# Patient Record
Sex: Male | Born: 1950 | Race: White | Hispanic: No | Marital: Single | Smoking: Current every day smoker
Health system: Southern US, Community
[De-identification: ages and names within clinical notes are randomized; demographics above are authoritative.]

## PROBLEM LIST (undated history)

## (undated) DIAGNOSIS — E785 Hyperlipidemia, unspecified: Secondary | ICD-10-CM

## (undated) DIAGNOSIS — F319 Bipolar disorder, unspecified: Secondary | ICD-10-CM

## (undated) DIAGNOSIS — I10 Essential (primary) hypertension: Secondary | ICD-10-CM

## (undated) DIAGNOSIS — E119 Type 2 diabetes mellitus without complications: Secondary | ICD-10-CM

## (undated) DIAGNOSIS — C6290 Malignant neoplasm of unspecified testis, unspecified whether descended or undescended: Secondary | ICD-10-CM

## (undated) DIAGNOSIS — K219 Gastro-esophageal reflux disease without esophagitis: Secondary | ICD-10-CM

## (undated) DIAGNOSIS — I219 Acute myocardial infarction, unspecified: Secondary | ICD-10-CM

## (undated) HISTORY — PX: BACK SURGERY: SHX140

## (undated) HISTORY — PX: SURGERY SCROTAL / TESTICULAR: SUR1316

---

## 2005-11-28 ENCOUNTER — Emergency Department: Payer: Self-pay | Admitting: Internal Medicine

## 2010-08-24 ENCOUNTER — Ambulatory Visit: Payer: Self-pay | Admitting: Family Medicine

## 2011-03-16 DIAGNOSIS — E785 Hyperlipidemia, unspecified: Secondary | ICD-10-CM | POA: Insufficient documentation

## 2011-03-16 DIAGNOSIS — E039 Hypothyroidism, unspecified: Secondary | ICD-10-CM | POA: Insufficient documentation

## 2011-03-16 DIAGNOSIS — F319 Bipolar disorder, unspecified: Secondary | ICD-10-CM | POA: Insufficient documentation

## 2011-03-16 DIAGNOSIS — L719 Rosacea, unspecified: Secondary | ICD-10-CM | POA: Insufficient documentation

## 2011-03-16 DIAGNOSIS — K219 Gastro-esophageal reflux disease without esophagitis: Secondary | ICD-10-CM | POA: Insufficient documentation

## 2011-03-16 DIAGNOSIS — N529 Male erectile dysfunction, unspecified: Secondary | ICD-10-CM | POA: Insufficient documentation

## 2011-03-16 DIAGNOSIS — Z8547 Personal history of malignant neoplasm of testis: Secondary | ICD-10-CM | POA: Insufficient documentation

## 2012-12-25 DIAGNOSIS — Z720 Tobacco use: Secondary | ICD-10-CM | POA: Insufficient documentation

## 2012-12-25 DIAGNOSIS — Z6828 Body mass index (BMI) 28.0-28.9, adult: Secondary | ICD-10-CM | POA: Insufficient documentation

## 2014-05-31 DIAGNOSIS — M1711 Unilateral primary osteoarthritis, right knee: Secondary | ICD-10-CM | POA: Insufficient documentation

## 2014-12-16 DIAGNOSIS — D126 Benign neoplasm of colon, unspecified: Secondary | ICD-10-CM | POA: Insufficient documentation

## 2015-10-28 ENCOUNTER — Observation Stay
Admit: 2015-10-28 | Discharge: 2015-10-28 | Disposition: A | Discharge status: Home / Self Care | Payer: BLUE CROSS/BLUE SHIELD | Attending: Internal Medicine | Admitting: Internal Medicine

## 2015-10-28 ENCOUNTER — Observation Stay
Admission: EM | Admit: 2015-10-28 | Discharge: 2015-10-29 | Disposition: A | Discharge status: Home / Self Care | Payer: BLUE CROSS/BLUE SHIELD | Attending: Internal Medicine | Admitting: Internal Medicine

## 2015-10-28 ENCOUNTER — Other Ambulatory Visit: Payer: Self-pay

## 2015-10-28 ENCOUNTER — Observation Stay: Payer: BLUE CROSS/BLUE SHIELD

## 2015-10-28 ENCOUNTER — Encounter: Payer: Self-pay | Admitting: Emergency Medicine

## 2015-10-28 ENCOUNTER — Emergency Department: Payer: BLUE CROSS/BLUE SHIELD

## 2015-10-28 DIAGNOSIS — F319 Bipolar disorder, unspecified: Secondary | ICD-10-CM | POA: Diagnosis not present

## 2015-10-28 DIAGNOSIS — I700 Atherosclerosis of aorta: Secondary | ICD-10-CM | POA: Insufficient documentation

## 2015-10-28 DIAGNOSIS — Z8249 Family history of ischemic heart disease and other diseases of the circulatory system: Secondary | ICD-10-CM | POA: Insufficient documentation

## 2015-10-28 DIAGNOSIS — E1165 Type 2 diabetes mellitus with hyperglycemia: Secondary | ICD-10-CM | POA: Diagnosis not present

## 2015-10-28 DIAGNOSIS — I2510 Atherosclerotic heart disease of native coronary artery without angina pectoris: Secondary | ICD-10-CM | POA: Diagnosis not present

## 2015-10-28 DIAGNOSIS — R0789 Other chest pain: Principal | ICD-10-CM | POA: Insufficient documentation

## 2015-10-28 DIAGNOSIS — Z8547 Personal history of malignant neoplasm of testis: Secondary | ICD-10-CM | POA: Insufficient documentation

## 2015-10-28 DIAGNOSIS — R079 Chest pain, unspecified: Secondary | ICD-10-CM | POA: Diagnosis present

## 2015-10-28 DIAGNOSIS — Z9889 Other specified postprocedural states: Secondary | ICD-10-CM | POA: Insufficient documentation

## 2015-10-28 DIAGNOSIS — Z79899 Other long term (current) drug therapy: Secondary | ICD-10-CM | POA: Insufficient documentation

## 2015-10-28 DIAGNOSIS — I10 Essential (primary) hypertension: Secondary | ICD-10-CM | POA: Insufficient documentation

## 2015-10-28 DIAGNOSIS — F17210 Nicotine dependence, cigarettes, uncomplicated: Secondary | ICD-10-CM | POA: Insufficient documentation

## 2015-10-28 DIAGNOSIS — Z91048 Other nonmedicinal substance allergy status: Secondary | ICD-10-CM | POA: Insufficient documentation

## 2015-10-28 DIAGNOSIS — E785 Hyperlipidemia, unspecified: Secondary | ICD-10-CM | POA: Insufficient documentation

## 2015-10-28 DIAGNOSIS — Z7984 Long term (current) use of oral hypoglycemic drugs: Secondary | ICD-10-CM | POA: Insufficient documentation

## 2015-10-28 DIAGNOSIS — Z7982 Long term (current) use of aspirin: Secondary | ICD-10-CM | POA: Diagnosis not present

## 2015-10-28 DIAGNOSIS — E039 Hypothyroidism, unspecified: Secondary | ICD-10-CM | POA: Insufficient documentation

## 2015-10-28 DIAGNOSIS — I252 Old myocardial infarction: Secondary | ICD-10-CM | POA: Diagnosis not present

## 2015-10-28 DIAGNOSIS — E871 Hypo-osmolality and hyponatremia: Secondary | ICD-10-CM | POA: Insufficient documentation

## 2015-10-28 HISTORY — DX: Type 2 diabetes mellitus without complications: E11.9

## 2015-10-28 HISTORY — DX: Essential (primary) hypertension: I10

## 2015-10-28 HISTORY — DX: Hyperlipidemia, unspecified: E78.5

## 2015-10-28 HISTORY — DX: Bipolar disorder, unspecified: F31.9

## 2015-10-28 HISTORY — DX: Malignant neoplasm of unspecified testis, unspecified whether descended or undescended: C62.90

## 2015-10-28 LAB — CBC
HCT: 39.6 % — ABNORMAL LOW (ref 40.0–52.0)
HEMOGLOBIN: 14.1 g/dL (ref 13.0–18.0)
MCH: 34.1 pg — AB (ref 26.0–34.0)
MCHC: 35.7 g/dL (ref 32.0–36.0)
MCV: 95.6 fL (ref 80.0–100.0)
PLATELETS: 192 10*3/uL (ref 150–440)
RBC: 4.15 MIL/uL — AB (ref 4.40–5.90)
RDW: 16.1 % — ABNORMAL HIGH (ref 11.5–14.5)
WBC: 8.6 10*3/uL (ref 3.8–10.6)

## 2015-10-28 LAB — ECHOCARDIOGRAM COMPLETE
CHL CUP MV DEC (S): 194
E decel time: 194 msec
EERAT: 9.16
FS: 30 % (ref 28–44)
HEIGHTINCHES: 77 in
IV/PV OW: 1.05
LA diam end sys: 44 mm
LA diam index: 1.74 cm/m2
LA vol A4C: 79.8 ml
LA vol: 78.4 mL
LASIZE: 44 mm
LAVOLIN: 31 mL/m2
LV E/e' medial: 9.16
LV E/e'average: 9.16
LV SIMPSON'S DISK: 53
LV TDI E'LATERAL: 10.3
LV TDI E'MEDIAL: 6.64
LV dias vol index: 58 mL/m2
LV dias vol: 146 mL (ref 62–150)
LV e' LATERAL: 10.3 cm/s
LV sys vol index: 27 mL/m2
LV sys vol: 69 mL — AB (ref 21–61)
LVOT area: 5.31 cm2
LVOTD: 26 mm
MV Peak grad: 4 mmHg
MVPKAVEL: 71.2 m/s
MVPKEVEL: 94.3 m/s
PW: 13.5 mm — AB (ref 0.6–1.1)
Stroke v: 77 ml
TAPSE: 28.1 mm
WEIGHTICAEL: 4288 [oz_av]

## 2015-10-28 LAB — BASIC METABOLIC PANEL
Anion gap: 11 (ref 5–15)
Anion gap: 9 (ref 5–15)
BUN: 12 mg/dL (ref 6–20)
BUN: 12 mg/dL (ref 6–20)
CALCIUM: 9 mg/dL (ref 8.9–10.3)
CALCIUM: 8.5 mg/dL — AB (ref 8.9–10.3)
CHLORIDE: 95 mmol/L — AB (ref 101–111)
CHLORIDE: 97 mmol/L — AB (ref 101–111)
CO2: 24 mmol/L (ref 22–32)
CO2: 24 mmol/L (ref 22–32)
CREATININE: 1.09 mg/dL (ref 0.61–1.24)
CREATININE: 0.99 mg/dL (ref 0.61–1.24)
GFR calc non Af Amer: >60 mL/min (ref >60)
GFR calc non Af Amer: >60 mL/min (ref >60)
GLUCOSE: 411 mg/dL — AB (ref 65–99)
Glucose, Bld: 401 mg/dL — ABNORMAL HIGH (ref 65–99)
Potassium: 3.8 mmol/L (ref 3.5–5.1)
Potassium: 3.8 mmol/L (ref 3.5–5.1)
SODIUM: 130 mmol/L — AB (ref 135–145)
Sodium: 130 mmol/L — ABNORMAL LOW (ref 135–145)

## 2015-10-28 LAB — NM MYOCAR MULTI W/SPECT W/WALL MOTION / EF
CHL CUP NUCLEAR SRS: 7
CHL CUP NUCLEAR SSS: 7
CHL CUP RESTING HR STRESS: 59 {beats}/min
CSEPED: 1 min
CSEPEDS: 29 s
Estimated workload: 1 METS
LV dias vol: 152 mL (ref 62–150)
LV sys vol: 83 mL
NUC STRESS TID: 0.94
Peak HR: 83 {beats}/min
SDS: 0

## 2015-10-28 LAB — LIPID PANEL
CHOL/HDL RATIO: 4 ratio
Cholesterol: 165 mg/dL (ref 0–200)
HDL: 41 mg/dL (ref >40)
LDL CALC: UNDETERMINED mg/dL (ref 0–99)
Triglycerides: 401 mg/dL — ABNORMAL HIGH (ref <150)
VLDL: UNDETERMINED mg/dL (ref 0–40)

## 2015-10-28 LAB — GLUCOSE, CAPILLARY
GLUCOSE-CAPILLARY: 378 mg/dL — AB (ref 65–99)
GLUCOSE-CAPILLARY: 101 mg/dL — AB (ref 65–99)
GLUCOSE-CAPILLARY: 260 mg/dL — AB (ref 65–99)
GLUCOSE-CAPILLARY: 85 mg/dL (ref 65–99)
Glucose-Capillary: 379 mg/dL — ABNORMAL HIGH (ref 65–99)
Glucose-Capillary: 418 mg/dL — ABNORMAL HIGH (ref 65–99)

## 2015-10-28 LAB — TROPONIN I

## 2015-10-28 LAB — ETHANOL: Alcohol, Ethyl (B): 82 mg/dL — ABNORMAL HIGH (ref <5)

## 2015-10-28 MED ORDER — SODIUM CHLORIDE 0.9% FLUSH
3.0000 mL | Freq: Two times a day (BID) | INTRAVENOUS | Status: DC
  Administered 2015-10-28 – 2015-10-29 (×3): 3 mL via INTRAVENOUS

## 2015-10-28 MED ORDER — ASPIRIN EC 81 MG PO TBEC
81.0000 mg | DELAYED_RELEASE_TABLET | Freq: Every day | ORAL | Status: DC
  Administered 2015-10-28 – 2015-10-29 (×2): 81 mg via ORAL
  Filled 2015-10-28 (×2): qty 1

## 2015-10-28 MED ORDER — ASPIRIN 300 MG RE SUPP: 300.0000 mg | RECTAL | Status: AC

## 2015-10-28 MED ORDER — NITROGLYCERIN 2 % TD OINT: 0.5000 [in_us] | TOPICAL_OINTMENT | Freq: Four times a day (QID) | TRANSDERMAL | Status: DC

## 2015-10-28 MED ORDER — LAMOTRIGINE 100 MG PO TABS
200.0000 mg | ORAL_TABLET | Freq: Every day | ORAL | Status: DC
  Administered 2015-10-28 – 2015-10-29 (×2): 200 mg via ORAL
  Filled 2015-10-28 (×2): qty 2

## 2015-10-28 MED ORDER — CARVEDILOL 3.125 MG PO TABS
3.1250 mg | ORAL_TABLET | Freq: Two times a day (BID) | ORAL | Status: DC
Start: 2015-10-28 — End: 2015-10-29
  Administered 2015-10-28 – 2015-10-29 (×3): 3.125 mg via ORAL
  Filled 2015-10-28 (×3): qty 1

## 2015-10-28 MED ORDER — INSULIN ASPART 100 UNIT/ML ~~LOC~~ SOLN
0.0000 [IU] | Freq: Three times a day (TID) | SUBCUTANEOUS | Status: DC
  Administered 2015-10-28: 8 [IU] via SUBCUTANEOUS
  Administered 2015-10-29: 3 [IU] via SUBCUTANEOUS
  Filled 2015-10-28: qty 3
  Filled 2015-10-28: qty 8

## 2015-10-28 MED ORDER — ASPIRIN 81 MG PO CHEW: 324.0000 mg | CHEWABLE_TABLET | ORAL | Status: AC

## 2015-10-28 MED ORDER — TECHNETIUM TC 99M TETROFOSMIN IV KIT
10.0000 | PACK | Freq: Once | INTRAVENOUS | Status: AC | PRN
  Administered 2015-10-28: 12.64 via INTRAVENOUS

## 2015-10-28 MED ORDER — ATORVASTATIN CALCIUM 20 MG PO TABS
40.0000 mg | ORAL_TABLET | Freq: Every day | ORAL | Status: DC
  Administered 2015-10-28: 40 mg via ORAL
  Filled 2015-10-28: qty 2

## 2015-10-28 MED ORDER — ONDANSETRON HCL 4 MG/2ML IJ SOLN: 4.0000 mg | Freq: Four times a day (QID) | INTRAMUSCULAR | Status: DC | PRN

## 2015-10-28 MED ORDER — METFORMIN HCL 500 MG PO TABS
1000.0000 mg | ORAL_TABLET | Freq: Every day | ORAL | Status: DC
  Administered 2015-10-28 – 2015-10-29 (×2): 1000 mg via ORAL
  Filled 2015-10-28 (×2): qty 2

## 2015-10-28 MED ORDER — SODIUM CHLORIDE 0.9 % IV SOLN: 250.0000 mL | INTRAVENOUS | Status: DC | PRN

## 2015-10-28 MED ORDER — GLIPIZIDE ER 10 MG PO TB24
10.0000 mg | ORAL_TABLET | Freq: Every day | ORAL | Status: DC
  Administered 2015-10-28 – 2015-10-29 (×2): 10 mg via ORAL
  Filled 2015-10-28 (×2): qty 1

## 2015-10-28 MED ORDER — PIOGLITAZONE HCL 15 MG PO TABS
30.0000 mg | ORAL_TABLET | Freq: Every day | ORAL | Status: DC
  Administered 2015-10-28 – 2015-10-29 (×2): 30 mg via ORAL
  Filled 2015-10-28 (×2): qty 2

## 2015-10-28 MED ORDER — SIMVASTATIN 40 MG PO TABS: 40.0000 mg | ORAL_TABLET | Freq: Every day | ORAL | Status: DC

## 2015-10-28 MED ORDER — NICOTINE 21 MG/24HR TD PT24
21.0000 mg | MEDICATED_PATCH | Freq: Every day | TRANSDERMAL | Status: DC
  Administered 2015-10-28 – 2015-10-29 (×2): 21 mg via TRANSDERMAL
  Filled 2015-10-28 (×2): qty 1

## 2015-10-28 MED ORDER — INSULIN ASPART 100 UNIT/ML ~~LOC~~ SOLN
20.0000 [IU] | Freq: Once | SUBCUTANEOUS | Status: AC
  Administered 2015-10-28: 20 [IU] via SUBCUTANEOUS
  Filled 2015-10-28: qty 20

## 2015-10-28 MED ORDER — LISINOPRIL 5 MG PO TABS
5.0000 mg | ORAL_TABLET | Freq: Every day | ORAL | Status: DC
  Administered 2015-10-29: 5 mg via ORAL
  Filled 2015-10-28: qty 1

## 2015-10-28 MED ORDER — LORAZEPAM 2 MG PO TABS
2.0000 mg | ORAL_TABLET | ORAL | Status: DC | PRN
  Administered 2015-10-28: 2 mg via ORAL
  Filled 2015-10-28: qty 1

## 2015-10-28 MED ORDER — SODIUM CHLORIDE 0.9 % IV BOLUS (SEPSIS)
1000.0000 mL | Freq: Once | INTRAVENOUS | Status: AC
  Administered 2015-10-28: 1000 mL via INTRAVENOUS

## 2015-10-28 MED ORDER — KETOROLAC TROMETHAMINE 15 MG/ML IJ SOLN
30.0000 mg | Freq: Three times a day (TID) | INTRAMUSCULAR | Status: DC
  Administered 2015-10-28 – 2015-10-29 (×3): 30 mg via INTRAVENOUS
  Filled 2015-10-28 (×3): qty 2

## 2015-10-28 MED ORDER — INSULIN ASPART 100 UNIT/ML ~~LOC~~ SOLN: 0.0000 [IU] | Freq: Every day | SUBCUTANEOUS | Status: DC

## 2015-10-28 MED ORDER — SODIUM CHLORIDE 0.9% FLUSH: 3.0000 mL | INTRAVENOUS | Status: DC | PRN

## 2015-10-28 MED ORDER — ENOXAPARIN SODIUM 40 MG/0.4ML ~~LOC~~ SOLN
40.0000 mg | SUBCUTANEOUS | Status: DC
  Administered 2015-10-28 – 2015-10-29 (×2): 40 mg via SUBCUTANEOUS
  Filled 2015-10-28 (×2): qty 0.4

## 2015-10-28 MED ORDER — TECHNETIUM TC 99M TETROFOSMIN IV KIT
30.0000 | PACK | Freq: Once | INTRAVENOUS | Status: AC | PRN
  Administered 2015-10-28: 30.28 via INTRAVENOUS

## 2015-10-28 MED ORDER — NITROGLYCERIN 0.4 MG SL SUBL
0.4000 mg | SUBLINGUAL_TABLET | SUBLINGUAL | Status: DC | PRN
Start: 2015-10-28 — End: 2015-10-29

## 2015-10-28 MED ORDER — LEVOTHYROXINE SODIUM 100 MCG PO TABS
100.0000 ug | ORAL_TABLET | Freq: Every day | ORAL | Status: DC
  Administered 2015-10-28 – 2015-10-29 (×2): 100 ug via ORAL
  Filled 2015-10-28 (×2): qty 1

## 2015-10-28 MED ORDER — REGADENOSON 0.4 MG/5ML IV SOLN
0.4000 mg | Freq: Once | INTRAVENOUS | Status: AC
  Administered 2015-10-28: 0.4 mg via INTRAVENOUS
  Filled 2015-10-28: qty 5

## 2015-10-28 MED ORDER — LISINOPRIL 20 MG PO TABS
20.0000 mg | ORAL_TABLET | Freq: Every day | ORAL | Status: DC
  Filled 2015-10-28: qty 1

## 2015-10-28 MED ORDER — SERTRALINE HCL 50 MG PO TABS
50.0000 mg | ORAL_TABLET | Freq: Every day | ORAL | Status: DC
  Administered 2015-10-28 – 2015-10-29 (×2): 50 mg via ORAL
  Filled 2015-10-28 (×2): qty 1

## 2015-10-28 MED ORDER — INSULIN GLARGINE 100 UNIT/ML ~~LOC~~ SOLN
10.0000 [IU] | Freq: Two times a day (BID) | SUBCUTANEOUS | Status: DC
  Administered 2015-10-28 – 2015-10-29 (×2): 10 [IU] via SUBCUTANEOUS
  Filled 2015-10-28 (×4): qty 0.1

## 2015-10-28 MED ORDER — ACETAMINOPHEN 325 MG PO TABS
650.0000 mg | ORAL_TABLET | ORAL | Status: DC | PRN
Start: 2015-10-28 — End: 2015-10-29

## 2015-10-28 MED ORDER — ASPIRIN EC 81 MG PO TBEC: 81.0000 mg | DELAYED_RELEASE_TABLET | Freq: Every day | ORAL | Status: DC

## 2015-10-28 MED ORDER — IOPAMIDOL (ISOVUE-370) INJECTION 76%
100.0000 mL | Freq: Once | INTRAVENOUS | Status: AC | PRN
  Administered 2015-10-28: 100 mL via INTRAVENOUS

## 2015-10-28 NOTE — Progress Notes (Signed)
Pt CBG resulted at 85. Orders for lantus 10 units SQ. RN not comfortable giving this pt lanuts as he does not take it at home, and his sugars today have already dropped significantly from the 400s this am. MD paged to make aware, no response received. Rachael Fee, RN

## 2015-10-28 NOTE — H&P (Signed)
[facility] [place] NAME: Alexander Tran    MR#:  [id number]  DATE OF BIRTH:  Sep 05, 1950  DATE OF ADMISSION:  10/28/2015  PRIMARY CARE PHYSICIAN: No primary care provider on file.   REQUESTING/REFERRING PHYSICIAN:   CHIEF COMPLAINT:   Chief Complaint  Patient presents with  . Chest Pain    HISTORY OF PRESENT ILLNESS: Alexander Tran  is a 65 y.o. male with a known history of Diabetes mellitus type 2, testicular cancer, bipolar disorder with manic depression, hyperlipidemia presented to the emergency room with chest pain. Patient's chest pain started on Monday but it has been worse since yesterday. Chest pain is located left side of the chest and sharp in nature. Pain is 10 out of 10 on a scale of 1-10. No history of any nausea or vomiting. The chest pain gets worsened on exertion. Patient had last cardiac stress test more than 10 years ago. No history of any shortness of breath, no history of orthopnea. No history of headache dizziness blurry vision. No history of syncope or seizure. First set of troponin is negative. EKG showed ST depression in anterior leads. Hospitalist service was consulted for further care of the patient.  PAST MEDICAL HISTORY:   Past Medical History  Diagnosis Date  . Diabetes mellitus without complication ([place])   . 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[place])   . Manic depression ([place])   . Hyperlipidemia   . Hypertension     PAST SURGICAL HISTORY: Past Surgical History  Procedure Laterality Date  . Back surgery    . Surgery scrotal / testicular      SOCIAL HISTORY:  Social History  Substance Use Topics  . Smoking status: Current Every Day Smoker -- 1.00 packs/day    Types: Cigarettes  . Smokeless tobacco: Not on file  . Alcohol Use: 0.0 oz/week    0 Standard drinks or equivalent per week     Comment: one glass of wine daily    FAMILY HISTORY:  Family History  Problem Relation Age of Onset  . Coronary artery disease  Father     DRUG ALLERGIES:  Allergies  Allergen Reactions  . Other Other (See Comments)    Uncoded Allergy. Allergen: bandaid adhesive, Other Reaction: Contac Dermatitis    REVIEW OF SYSTEMS:   CONSTITUTIONAL: No fever, fatigue or weakness.  EYES: No blurred or double vision.  EARS, NOSE, AND THROAT: No tinnitus or ear pain.  RESPIRATORY: No cough, shortness of breath, wheezing or hemoptysis.  CARDIOVASCULAR: Has chest pain, no orthopnea, edema.  GASTROINTESTINAL: No nausea, vomiting, diarrhea or abdominal pain.  GENITOURINARY: No dysuria, hematuria.  ENDOCRINE: No polyuria, nocturia,  HEMATOLOGY: No anemia, easy bruising or bleeding SKIN: No rash or lesion. MUSCULOSKELETAL: No joint pain or arthritis.   NEUROLOGIC: No tingling, numbness, weakness.  PSYCHIATRY: No anxiety or depression.   MEDICATIONS AT HOME:  Prior to Admission medications   Medication Sig Start Date End Date Taking? Authorizing Provider  aspirin 325 MG tablet Take 325 mg by mouth once.   Yes Historical Provider, MD  aspirin 81 MG tablet Take 81 mg by mouth daily. 10/16/06  Yes Historical Provider, MD  glipiZIDE (GLUCOTROL XL) 10 MG 24 hr tablet Take 10 mg by mouth daily. 09/03/14  Yes Historical Provider, MD  lamoTRIgine (LAMICTAL) 200 MG tablet Take 1 tablet by mouth daily. 01/12/14  Yes Historical Provider, MD  levothyroxine (SYNTHROID, LEVOTHROID) 100 MCG tablet Take 100 mcg by mouth every morning. 12/16/14  Yes Historical Provider, MD  lisinopril (PRINIVIL,ZESTRIL) 20 MG tablet Take 20 mg by mouth daily. 03/24/15  Yes Historical Provider, MD  metFORMIN (GLUCOPHAGE) 1000 MG tablet Take 1,000 mg by mouth daily. 09/03/14  Yes Historical Provider, MD  pioglitazone (ACTOS) 30 MG tablet Take 30 mg by mouth daily. 12/16/14  Yes Historical Provider, MD  sertraline (ZOLOFT) 50 MG tablet Take 50 mg by mouth daily. 01/12/14  Yes Historical Provider, MD  simvastatin (ZOCOR) 40 MG tablet Take 40 mg by mouth at bedtime. 12/16/14   Yes Historical Provider, MD      PHYSICAL EXAMINATION:   VITAL SIGNS: Blood pressure 125/65, pulse 62, temperature 97.8 F (36.6 C), resp. rate 11, height 6\' 5"  (1.956 m), SpO2 96 %.  GENERAL:  65 y.o.-year-old patient lying in the bed with no acute distress.  EYES: Pupils equal, round, reactive to light and accommodation. No scleral icterus. Extraocular muscles intact.  HEENT: Head atraumatic, normocephalic. Oropharynx and nasopharynx clear.  NECK:  Supple, no jugular venous distention. No thyroid enlargement, no tenderness.  LUNGS: Normal breath sounds bilaterally, no wheezing, rales,rhonchi or crepitation. No use of accessory muscles of respiration.  CARDIOVASCULAR: S1, S2 normal. No murmurs, rubs, or gallops.  ABDOMEN: Soft, nontender, nondistended. Bowel sounds present. No organomegaly or mass.  EXTREMITIES: No pedal edema, cyanosis, or clubbing.  NEUROLOGIC: Cranial nerves II through XII are intact. Muscle strength 5/5 in all extremities. Sensation intact. Gait normal. PSYCHIATRIC: The patient is alert and oriented x 3.  SKIN: No obvious rash, lesion, or ulcer.   LABORATORY PANEL:   CBC  Recent Labs Lab 10/28/15 0149  WBC 8.6  HGB 14.1  HCT 39.6*  PLT 192  MCV 95.6  MCH 34.1*  MCHC 35.7  RDW 16.1*   ------------------------------------------------------------------------------------------------------------------  Chemistries   Recent Labs Lab 10/28/15 0149  NA 130*  K 3.8  CL 95*  CO2 24  GLUCOSE 411*  BUN 12  CREATININE 1.09  CALCIUM 9.0   ------------------------------------------------------------------------------------------------------------------ CrCl cannot be calculated (Unknown ideal weight.). ------------------------------------------------------------------------------------------------------------------ No results for input(s): TSH, T4TOTAL, T3FREE, THYROIDAB in the last 72 hours.  Invalid input(s): FREET3   Coagulation profile No  results for input(s): INR, PROTIME in the last 168 hours. ------------------------------------------------------------------------------------------------------------------- No results for input(s): DDIMER in the last 72 hours. -------------------------------------------------------------------------------------------------------------------  Cardiac Enzymes  Recent Labs Lab 10/28/15 0149  TROPONINI <0.03   ------------------------------------------------------------------------------------------------------------------ Invalid input(s): POCBNP  ---------------------------------------------------------------------------------------------------------------  Urinalysis No results found for: COLORURINE, APPEARANCEUR, LABSPEC, PHURINE, GLUCOSEU, HGBUR, BILIRUBINUR, KETONESUR, PROTEINUR, UROBILINOGEN, NITRITE, LEUKOCYTESUR   RADIOLOGY: Dg Chest Portable 1 View  10/28/2015  CLINICAL DATA:  Acute onset of left-sided chest pain with exertion, radiating to the right side of the chest. Initial encounter. EXAM: PORTABLE CHEST 1 VIEW COMPARISON:  None. FINDINGS: The lungs are well-aerated. Pulmonary vascularity is at the upper limits of normal. Mild left basilar opacity likely reflects atelectasis. There is no evidence of pleural effusion or pneumothorax. The cardiomediastinal silhouette is within normal limits. No acute osseous abnormalities are seen. IMPRESSION: Mild left basilar opacity likely reflects atelectasis. Lungs otherwise clear. Electronically Signed   By: Garald Balding M.D.   On: 10/28/2015 01:29    EKG: Orders placed or performed during the hospital encounter of 10/28/15  . ED EKG  . ED EKG  . ED EKG  . ED EKG    IMPRESSION AND PLAN: 65 year old male patient with history of type 2 diabetes mellitus, hypertension, hyperlipidemia, bipolar disorder with manic depression presented to the emergency room with chest pain.  Admitting diagnosis 1. Unstable angina 2.uncontrolled  diabetes mellitus  3.hyponatremia  4.hypertension  5.hyperlipidemia  6.bipolar disorder   treatment plan Admit patient to telemetry observation bed Aspirin 81 mg daily DVT prophylaxis with subcutaneous Lovenox 40 MG daily Cycle troponin to rule out ischemia Check echocardiogram Control blood sugars with Lantus insulin, sliding scale coverage insulin subcutaneously and oral glipizide and pioglitazone Follow-up sodium level Resume statin medication and start patient on oral beta blocker. Cardiology consultation for chest pain Supportive care  All the records are reviewed and case discussed with ED provider. Management plans discussed with the patient, family and they are in agreement.  CODE STATUS:FULL Code Status History    This patient does not have a recorded code status. Please follow your organizational policy for patients in this situation.       TOTAL TIME TAKING CARE OF THIS PATIENT: 51 minutes.    Saundra Shelling M.D on 10/28/2015 at 4:38 AM  Between 7am to 6pm - Pager - [phone]  After 6pm go to [email or web address] - password [email or web address] [facility]  [facility]  Office  [phone]  CC: Primary care physician; No primary care provider on file.

## 2015-10-28 NOTE — ED Notes (Signed)
Admitting MD at bedside.

## 2015-10-28 NOTE — Progress Notes (Signed)
Per Dr. Saralyn Pilar okay to hold lisinopril due to low BP. Will continue to monitor Alexander Tran

## 2015-10-28 NOTE — Progress Notes (Signed)
Echocardiogram 2D Echocardiogram has been performed.  Alexander Tran, Alexander Tran 10/28/2015, 2:26 PM

## 2015-10-28 NOTE — Progress Notes (Signed)
Insulin 20 units novolog given. CBG 378 at 0844. Will continue to monitor. Horton Finer

## 2015-10-28 NOTE — ED Provider Notes (Signed)
[facility] Emergency Department Provider Note  ____________________________________________  Time seen: 1:15 AM  I have reviewed the triage vital signs and the nursing notes.   HISTORY  Chief Complaint Chest Pain     HPI Alexander Tran is a 65 y.o. male with history of diabetes testicular cancer presents with left-sided chest pain 4 days which occurs only with exertion relieved with rest. Patient states that the pain initially was on his left side seen approximately one to 2 minutes resolved with rest. Patient states tonight he had the pain on the right side as well with exertion. Patient has no pain during this evaluation. Patient denies any shortness of breath no diaphoresis no nausea or vomiting. Patient denies any cardiac disease. Patient does however admit that his father had a myocardial infarction in his 58s requiring quadruple bypass.    Past Medical History  Diagnosis Date  . Diabetes mellitus without complication ([place])   . 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[place])   . Manic depression ([place])     There are no active problems to display for this patient.  Past surgical history None No current outpatient prescriptions on file.  Allergies No known drug allergies No family history on file.  Social History Social History  Substance Use Topics  . Smoking status: Current Every Day Smoker -- 1.00 packs/day    Types: Cigarettes  . Smokeless tobacco: None  . Alcohol Use: No    Review of Systems  Constitutional: Negative for fever. Eyes: Negative for visual changes. ENT: Negative for sore throat. Cardiovascular: Negative for chest pain. Respiratory: Negative for shortness of breath. Gastrointestinal: Negative for abdominal pain, vomiting and diarrhea. Genitourinary: Negative for dysuria. Musculoskeletal: Negative for back pain. Skin: Negative for rash. Neurological: Negative for headaches, focal weakness or numbness.  10-point ROS otherwise  negative.  ____________________________________________   PHYSICAL EXAM:  VITAL SIGNS: ED Triage Vitals  Enc Vitals Group     BP 10/28/15 0108 150/73 mmHg     Pulse Rate 10/28/15 0108 77     Resp 10/28/15 0108 20     Temp 10/28/15 0108 97.8 F (36.6 C)     Temp src --      SpO2 10/28/15 0108 97 %     Weight --      Height 10/28/15 0108 6\' 5"  (1.956 m)     Head Cir --      Peak Flow --      Pain Score --      Pain Loc --      Pain Edu? --      Excl. in [place]? --     Constitutional: Alert and oriented. Well appearing and in no distress. Eyes: Conjunctivae are normal. PERRL. Normal extraocular movements. ENT   Head: Normocephalic and atraumatic.   Nose: No congestion/rhinnorhea.   Mouth/Throat: Mucous membranes are moist.   Neck: No stridor. Hematological/Lymphatic/Immunilogical: No cervical lymphadenopathy. Cardiovascular: Normal rate, regular rhythm. Normal and symmetric distal pulses are present in all extremities. No murmurs, rubs, or gallops. Respiratory: Normal respiratory effort without tachypnea nor retractions. Breath sounds are clear and equal bilaterally. No wheezes/rales/rhonchi. Gastrointestinal: Soft and nontender. No distention. There is no CVA tenderness. Genitourinary: deferred Musculoskeletal: Nontender with normal range of motion in all extremities. No joint effusions.  No lower extremity tenderness nor edema. Neurologic:  Normal speech and language. No gross focal neurologic deficits are appreciated. Speech is normal.  Skin:  Skin is warm, dry and intact. No rash noted. Psychiatric: Mood and affect are normal.  Speech and behavior are normal. Patient exhibits appropriate insight and judgment.  ____________________________________________    LABS (pertinent positives/negatives)  Labs Reviewed  CBC - Abnormal; Notable for the following:    RBC 4.15 (*)    HCT 39.6 (*)    MCH 34.1 (*)    RDW 16.1 (*)    All other components within normal  limits  BASIC METABOLIC PANEL  TROPONIN I  ETHANOL    ____________________________________________   EKG  ED ECG REPORT I, Simms N Calandra Madura, the attending physician, personally viewed and interpreted this ECG.   Date: 10/28/2015  EKG Time: 1:06 AM  Rate: 80  Rhythm: Normal sinus rhythm with right bundle branch block.  Axis: Normal  Intervals: Normal  ST&T Change: 1 aVL V5 V6 ST segment depression. ST segment elevation mm   ____________________________________________    RADIOLOGY  DG Chest Portable 1 View (Final result) Result time: 10/28/15 01:29:52   Final result by Rad Results In Interface (10/28/15 01:29:52)   Narrative:   CLINICAL DATA: Acute onset of left-sided chest pain with exertion, radiating to the right side of the chest. Initial encounter.  EXAM: PORTABLE CHEST 1 VIEW  COMPARISON: None.  FINDINGS: The lungs are well-aerated. Pulmonary vascularity is at the upper limits of normal. Mild left basilar opacity likely reflects atelectasis. There is no evidence of pleural effusion or pneumothorax.  The cardiomediastinal silhouette is within normal limits. No acute osseous abnormalities are seen.  IMPRESSION: Mild left basilar opacity likely reflects atelectasis. Lungs otherwise clear.   Electronically Signed By: Garald Balding M.D. On: 10/28/2015 01:29         INITIAL IMPRESSION / ASSESSMENT AND PLAN / ED COURSE  Pertinent labs & imaging results that were available during my care of the patient were reviewed by me and considered in my medical decision making (see chart for details).  Patient took 3 and 24 mg of aspirin before presentation to the emergency department. Given history EKG changes will admit the patient for further evaluation and management. Patient discussed with Dr.Pyreddy or hospital admission.  ____________________________________________   FINAL CLINICAL IMPRESSION(S) / ED DIAGNOSES  Final diagnoses:  Chest  pain, unspecified chest pain type      Gregor Hams, MD 10/28/15 0400

## 2015-10-28 NOTE — Progress Notes (Signed)
Am CBG 418. Notified Dr. Posey Pronto. Per MD give 20 units novolog. Will continue to monitor. Horton Finer

## 2015-10-28 NOTE — Progress Notes (Signed)
Pt requesting a nicotine patch, prime Dr. Has been paged.

## 2015-10-28 NOTE — Progress Notes (Signed)
Inpatient Diabetes Program Recommendations  AACE/ADA: New Consensus Statement on Inpatient Glycemic Control (2015)  Target Ranges:  Prepandial:   less than 140 mg/dL      Peak postprandial:   less than 180 mg/dL (1-2 hours)      Critically ill patients:  140 - 180 mg/dL   Lab Results  Component Value Date   GLUCAP 378* 10/28/2015    Review of Glycemic Control  Results for Alexander Tran, Alexander Tran (MRN [id number]) as of 10/28/2015 09:44  Ref. Range 10/28/2015 06:46 10/28/2015 07:16 10/28/2015 08:44  Glucose-Capillary Latest Ref Range: 65-99 mg/dL 379 (H) 418 (H) 378 (H)    Diabetes history: Type 2 Outpatient Diabetes medications: Glucotrol 10mg /day, Metformin 1000mg /day, Actos 30mg /day Current orders for Inpatient glycemic control: Glucotrol 10mg /day, Metformin 1000mg /day, Actos 30mg /day, Lantus 10 units bid, Novolog moderate resistance 0-15 units tid , Novolog 0-5 units qhs  Inpatient Diabetes Program Recommendations:   Please d/c Glipizide and Metformin while inpatient.          Please order an A1C to determine blood sugar control over the past 2-3 months.          Please consider increasing Lantus to 30 units qam starting now (0.25 units/kg)        Consider increasing Novolog correction to resistant correction (0-20 units) tid     Gentry Fitz, RN, [place], [place], CDE Diabetes Coordinator Inpatient Diabetes Program  [phone] (Team Pager) [phone] ([place]) 10/28/2015 10:02 AM

## 2015-10-28 NOTE — ED Notes (Signed)
Told nurse bed 9 was ready and gave her the chart

## 2015-10-28 NOTE — Progress Notes (Signed)
[facility]                                                                                                                                                                                            Patient Demographics   Alexander Tran, is a 65 y.o. male, DOB - [birth year], [id number]  Admit date - 10/28/2015   Admitting Physician Saundra Shelling, MD  Outpatient Primary MD for the patient is No primary care provider on file.   LOS -   Subjective: Patient presented with chest pain. He underwent a stress test which suggested a possible old MI. Patient was ambulated and he started complaining chest pain after walking.   Review of Systems:   CONSTITUTIONAL: No documented fever. No fatigue, weakness. No weight gain, no weight loss.  EYES: No blurry or double vision.  ENT: No tinnitus. No postnasal drip. No redness of the oropharynx.  RESPIRATORY: No cough, no wheeze, no hemoptysis. No dyspnea.  CARDIOVASCULAR:Positive chest pain. No orthopnea. No palpitations. No syncope.  GASTROINTESTINAL: No nausea, no vomiting or diarrhea. No abdominal pain. No melena or hematochezia.  GENITOURINARY: No dysuria or hematuria.  ENDOCRINE: No polyuria or nocturia. No heat or cold intolerance.  HEMATOLOGY: No anemia. No bruising. No bleeding.  INTEGUMENTARY: No rashes. No lesions.  MUSCULOSKELETAL: No arthritis. No swelling. No gout.  NEUROLOGIC: No numbness, tingling, or ataxia. No seizure-type activity.  PSYCHIATRIC: No anxiety. No insomnia. No ADD.    Vitals:   Filed Vitals:   10/28/15 0530 10/28/15 0612 10/28/15 0956 10/28/15 1252  BP: 127/73 125/52 95/55 96/70   Pulse: 69 70 71 64  Temp:  97.8 F (36.6 C)  98 F (36.7 C)  TempSrc:  Oral  Oral  Resp:    16  Height:  6\' 5"  (1.956 m)    Weight:  121.564 kg (268 lb)    SpO2: 93% 97%  96%    Wt Readings from Last 3 Encounters:  10/28/15 121.564 kg (268 lb)     Intake/Output Summary (Last  24 hours) at 10/28/15 1447 Last data filed at 10/28/15 1335  Gross per 24 hour  Intake      0 ml  Output    580 ml  Net   -580 ml    Physical Exam:   GENERAL: Pleasant-appearing in no apparent distress.  HEAD, EYES, EARS, NOSE AND THROAT: Atraumatic, normocephalic. Extraocular muscles are intact. Pupils equal and reactive to light. Sclerae anicteric. No conjunctival injection. No oro-pharyngeal erythema.  NECK: Supple. There is no jugular venous distention. No bruits, no lymphadenopathy, no thyromegaly.  HEART: Regular rate  and rhythm,. No murmurs, no rubs, no clicks.  LUNGS: Clear to auscultation bilaterally. No rales or rhonchi. No wheezes.  ABDOMEN: Soft, flat, nontender, nondistended. Has good bowel sounds. No hepatosplenomegaly appreciated.  EXTREMITIES: No evidence of any cyanosis, clubbing, or peripheral edema.  +2 pedal and radial pulses bilaterally.  NEUROLOGIC: The patient is alert, awake, and oriented x3 with no focal motor or sensory deficits appreciated bilaterally.  SKIN: Moist and warm with no rashes appreciated.  Psych: Not anxious, depressed LN: No inguinal LN enlargement    Antibiotics   Anti-infectives    None      Medications   Scheduled Meds: . aspirin  324 mg Oral NOW   Or  . aspirin  300 mg Rectal NOW  . aspirin EC  81 mg Oral Daily  . carvedilol  3.125 mg Oral BID WC  . enoxaparin (LOVENOX) injection  40 mg Subcutaneous Q24H  . glipiZIDE  10 mg Oral Q breakfast  . insulin aspart  0-15 Units Subcutaneous TID WC  . insulin aspart  0-5 Units Subcutaneous QHS  . insulin glargine  10 Units Subcutaneous BID  . lamoTRIgine  200 mg Oral Daily  . levothyroxine  100 mcg Oral QAC breakfast  . lisinopril  20 mg Oral Daily  . metFORMIN  1,000 mg Oral Q breakfast  . nicotine  21 mg Transdermal Daily  . nitroGLYCERIN  0.5 inch Topical Q6H  . pioglitazone  30 mg Oral Daily  . sertraline  50 mg Oral Daily  . simvastatin  40 mg Oral QHS  . sodium chloride  flush  3 mL Intravenous Q12H   Continuous Infusions:  PRN Meds:.sodium chloride, acetaminophen, LORazepam, nitroGLYCERIN, ondansetron (ZOFRAN) IV, sodium chloride flush   Data Review:   Micro Results No results found for this or any previous visit (from the past 240 hour(s)).  Radiology Reports Nm Myocar Multi W/spect W/wall Motion / Ef  10/28/2015   There was no ST segment deviation noted during stress.  Defect 1: There is a small defect of mild severity present in the apical anterior location.  Findings consistent with prior myocardial infarction.  This is a low risk study.  The left ventricular ejection fraction is mildly decreased (45-54%).    Dg Chest Portable 1 View  10/28/2015  CLINICAL DATA:  Acute onset of left-sided chest pain with exertion, radiating to the right side of the chest. Initial encounter. EXAM: PORTABLE CHEST 1 VIEW COMPARISON:  None. FINDINGS: The lungs are well-aerated. Pulmonary vascularity is at the upper limits of normal. Mild left basilar opacity likely reflects atelectasis. There is no evidence of pleural effusion or pneumothorax. The cardiomediastinal silhouette is within normal limits. No acute osseous abnormalities are seen. IMPRESSION: Mild left basilar opacity likely reflects atelectasis. Lungs otherwise clear. Electronically Signed   By: Garald Balding M.D.   On: 10/28/2015 01:29     CBC  Recent Labs Lab 10/28/15 0149  WBC 8.6  HGB 14.1  HCT 39.6*  PLT 192  MCV 95.6  MCH 34.1*  MCHC 35.7  RDW 16.1*    Chemistries   Recent Labs Lab 10/28/15 0149 10/28/15 0712  NA 130* 130*  K 3.8 3.8  CL 95* 97*  CO2 24 24  GLUCOSE 411* 401*  BUN 12 12  CREATININE 1.09 0.99  CALCIUM 9.0 8.5*   ------------------------------------------------------------------------------------------------------------------ estimated creatinine clearance is 111.7 mL/min (by C-G formula based on Cr of  0.99). ------------------------------------------------------------------------------------------------------------------ No results for input(s): HGBA1C in the last 72 hours. ------------------------------------------------------------------------------------------------------------------  Recent Labs  10/28/15 0712  CHOL 165  HDL 41  LDLCALC UNABLE TO CALCULATE IF TRIGLYCERIDE OVER 400 mg/dL  TRIG 401*  CHOLHDL 4.0   ------------------------------------------------------------------------------------------------------------------ No results for input(s): TSH, T4TOTAL, T3FREE, THYROIDAB in the last 72 hours.  Invalid input(s): FREET3 ------------------------------------------------------------------------------------------------------------------ No results for input(s): VITAMINB12, FOLATE, FERRITIN, TIBC, IRON, RETICCTPCT in the last 72 hours.  Coagulation profile No results for input(s): INR, PROTIME in the last 168 hours.  No results for input(s): DDIMER in the last 72 hours.  Cardiac Enzymes  Recent Labs Lab 10/28/15 0149 10/28/15 0718  TROPONINI <0.03 <0.03   ------------------------------------------------------------------------------------------------------------------ Invalid input(s): POCBNP    Assessment & Plan   Patient is a 65 year old presents with chest pain  1. Chest pain has multiple risk factors underwent a stress test which showed no acute ischemia, still having chest pain with exertion I have discussed with Dr.Parishcowes he will need to remain in the hospital until Monday to get a cardiac catheter We'll obtain a CT of the chest to rule out PE Continue aspirin  2. Diabetes type 2 patient reports that his sugars at home actually have been into the 80s to 150s but here 400 started on Lantus at this point I will leave him on that regimen because if he goes home and his sugars drop. He will need to keep a close eye on the blood sugars his primary care  provider can change him to Lantus if needed. while and while in the hospital continue Lantus and oral regimen   3. Essential hypertension continue current therapy with Coreg and lisinopril  4. Hypothyroidism continue Synthroid  5. Hyponatremia due to hyperglycemia  6. Hyperlipidemia change him to high-dose statin    Code Status Orders        Start     Ordered   10/28/15 0553  Full code   Continuous     10/28/15 0552    Code Status History    Date Active Date Inactive Code Status Order ID Comments User Context   This patient has a current code status but no historical code status.           Consults Cardiology DVT Prophylaxis  Lovenox  Lab Results  Component Value Date   PLT 192 10/28/2015     Time Spent in minutes   35 minutes  Greater than 50% of time spent in care coordination and counseling patient regarding the condition and plan of care.   Dustin Flock M.D on 10/28/2015 at 2:47 PM  Between 7am to 6pm - Pager - [phone]  After 6pm go to [email or web address] - password [email or web address] [facility]   Office  [phone]

## 2015-10-28 NOTE — Care Management (Signed)
no discharge needs 

## 2015-10-28 NOTE — Progress Notes (Signed)
Patient ambulated around the nursing station twice. Patient stated left chest discomfort with getting out of bed. No chest pain with ambulation. Notified Dr. Posey Pronto of stress test results. Will continue to monitor. Alexander Tran

## 2015-10-28 NOTE — ED Notes (Signed)
Patient ambulatory to triage with steady gait, without difficulty or distress noted; pt reports left sided CP since Monday with exertion, now radiating to right ; denies accomp symptoms; denies hx of same

## 2015-10-28 NOTE — Progress Notes (Signed)
MD Pyreddy aware of pt's capllary glucose of 379, no new orders received. Verbal order received for nicotine patch 21 mg daily.

## 2015-10-28 NOTE — Progress Notes (Signed)
Patient attempted to go downstairs to smoke a cigarette. Patient stated to nurse tech, "I am going downstairs to smoke a cigarette. Educated patient on staying on the unit due to cardiac monitoring orders, patient is educated on no smoking in the room. Patient stated, "I was going to stand in the bathroom, smoke a cigarette and blow the smoke in the vent." I removed cigarette and lighter from patients room and placed in patient's med bin. Will continue to monitor. Horton Finer

## 2015-10-28 NOTE — Consult Note (Signed)
[facility] Cardiology  CARDIOLOGY CONSULT NOTE  Patient ID: Alexander Tran MRN: [id number] DOB/AGE: [birth year] 65 y.o.  Admit date: 10/28/2015 Referring Physician Preddy Primary Physician  Primary Cardiologist  Reason for Consultation chest pain  HPI: 65 year old gentleman referred for evaluation of chest pain. The patient has a history of type 2 diabetes. He reports a one-week history of intermittent episodes of chest pain. He describes substernal left-sided chest discomfort, stabbing in nature, typically brief lasting just a few seconds unrelated to exertion. There is no associated nausea, vomiting or diaphoresis. EKG was nondiagnostic. Troponin was negative 2.  Review of systems complete and found to be negative unless listed above     Past Medical History  Diagnosis Date  . Diabetes mellitus without complication ([place])   . 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[place])   . Manic depression ([place])   . Hyperlipidemia   . Hypertension     Past Surgical History  Procedure Laterality Date  . Back surgery    . Surgery scrotal / testicular      Prescriptions prior to admission  Medication Sig Dispense Refill Last Dose  . aspirin 325 MG tablet Take 325 mg by mouth once.   10/28/2015 at 0000  . aspirin 81 MG tablet Take 81 mg by mouth daily.   Past Month at Unknown time  . glipiZIDE (GLUCOTROL XL) 10 MG 24 hr tablet Take 10 mg by mouth daily.   10/27/2015 at Unknown time  . lamoTRIgine (LAMICTAL) 200 MG tablet Take 1 tablet by mouth daily.   10/27/2015 at Unknown time  . levothyroxine (SYNTHROID, LEVOTHROID) 100 MCG tablet Take 100 mcg by mouth every morning.   10/27/2015 at Unknown time  . lisinopril (PRINIVIL,ZESTRIL) 20 MG tablet Take 20 mg by mouth daily.   10/27/2015 at Unknown time  . metFORMIN (GLUCOPHAGE) 1000 MG tablet Take 1,000 mg by mouth daily.   10/27/2015 at Unknown time  . pioglitazone (ACTOS) 30 MG tablet Take 30 mg by mouth daily.   10/27/2015 at Unknown time  . sertraline (ZOLOFT) 50 MG tablet Take 50  mg by mouth daily.   10/27/2015 at Unknown time  . simvastatin (ZOCOR) 40 MG tablet Take 40 mg by mouth at bedtime.   10/27/2015 at Unknown time   Social History   Social History  . Marital Status: Married    Spouse Name: N/A  . Number of Children: N/A  . Years of Education: N/A   Occupational History  . retired Estate manager/land agent   . works part time now    Social History Main Topics  . Smoking status: Current Every Day Smoker -- 1.00 packs/day    Types: Cigarettes  . Smokeless tobacco: Not on file  . Alcohol Use: 0.0 oz/week    0 Standard drinks or equivalent per week     Comment: one glass of wine daily  . Drug Use: No  . Sexual Activity: Not on file   Other Topics Concern  . Not on file   Social History Narrative  . No narrative on file    Family History  Problem Relation Age of Onset  . Coronary artery disease Father       Review of systems complete and found to be negative unless listed above      PHYSICAL EXAM  General: Well developed, well nourished, in no acute distress HEENT:  Normocephalic and atramatic Neck:  No JVD.  Lungs: Clear bilaterally to auscultation and percussion. Heart: HRRR . Normal S1 and S2 without gallops or murmurs.  Abdomen: Bowel sounds are positive, abdomen soft and non-tender  Msk:  Back normal, normal gait. Normal strength and tone for age. Extremities: No clubbing, cyanosis or edema.   Neuro: Alert and oriented X 3. Psych:  Good affect, responds appropriately  Labs:   Lab Results  Component Value Date   WBC 8.6 10/28/2015   HGB 14.1 10/28/2015   HCT 39.6* 10/28/2015   MCV 95.6 10/28/2015   PLT 192 10/28/2015    Recent Labs Lab 10/28/15 0149  NA 130*  K 3.8  CL 95*  CO2 24  BUN 12  CREATININE 1.09  CALCIUM 9.0  GLUCOSE 411*   Lab Results  Component Value Date   TROPONINI <0.03 10/28/2015   No results found for: CHOL No results found for: HDL No results found for: LDLCALC No results found for: TRIG No  results found for: CHOLHDL No results found for: LDLDIRECT    Radiology: Dg Chest Portable 1 View  10/28/2015  CLINICAL DATA:  Acute onset of left-sided chest pain with exertion, radiating to the right side of the chest. Initial encounter. EXAM: PORTABLE CHEST 1 VIEW COMPARISON:  None. FINDINGS: The lungs are well-aerated. Pulmonary vascularity is at the upper limits of normal. Mild left basilar opacity likely reflects atelectasis. There is no evidence of pleural effusion or pneumothorax. The cardiomediastinal silhouette is within normal limits. No acute osseous abnormalities are seen. IMPRESSION: Mild left basilar opacity likely reflects atelectasis. Lungs otherwise clear. Electronically Signed   By: Garald Balding M.D.   On: 10/28/2015 01:29    EKG: Normal sinus rhythm  ASSESSMENT AND PLAN:   1. Chest pain, with atypical features, negative troponin, nondiagnostic ECG  Recommendations  1. Agree with current therapy 2. Proceed with Lexiscan sestamibi study  Signed: Demaris Bousquet MD,PhD, [facility] 10/28/2015, 7:58 AM

## 2015-10-29 LAB — GLUCOSE, CAPILLARY: GLUCOSE-CAPILLARY: 178 mg/dL — AB (ref 65–99)

## 2015-10-29 LAB — TROPONIN I

## 2015-10-29 MED ORDER — NITROGLYCERIN 0.4 MG SL SUBL: 0.4000 mg | SUBLINGUAL_TABLET | SUBLINGUAL | Status: DC | PRN

## 2015-10-29 NOTE — Progress Notes (Signed)
MD Dr. Ernesto Rutherford aware of lantus being held. Rachael Fee, RN

## 2015-10-29 NOTE — Progress Notes (Signed)
Pt had no complaints of chest pain this am or last night. Pt eager to be d/c'd home. Orders to be d/c'd placed by MD and IV and tele were removed. Discharge instructions and cigarettes returned to pt. Pt ambulated off unit with nursing staff.

## 2015-10-29 NOTE — Discharge Instructions (Signed)
DIET:  Diabetic diet, cardiac diet  DISCHARGE CONDITION:  Stable  ACTIVITY:  Activity as tolerated  OXYGEN:  Home Oxygen: No.   Oxygen Delivery: room air  DISCHARGE LOCATION:  home    ADDITIONAL DISCHARGE INSTRUCTION:keep log of blood glucose to take to primary md   If you experience worsening of your admission symptoms, develop shortness of breath, life threatening emergency, suicidal or homicidal thoughts you must seek medical attention immediately by calling 911 or calling your MD immediately  if symptoms less severe.  You Must read complete instructions/literature along with all the possible adverse reactions/side effects for all the Medicines you take and that have been prescribed to you. Take any new Medicines after you have completely understood and accpet all the possible adverse reactions/side effects.   Please note  You were cared for by a hospitalist during your hospital stay. If you have any questions about your discharge medications or the care you received while you were in the hospital after you are discharged, you can call the unit and asked to speak with the hospitalist on call if the hospitalist that took care of you is not available. Once you are discharged, your primary care physician will handle any further medical issues. Please note that NO REFILLS for any discharge medications will be authorized once you are discharged, as it is imperative that you return to your primary care physician (or establish a relationship with a primary care physician if you do not have one) for your aftercare needs so that they can reassess your need for medications and monitor your lab values.   Chest Pain Observation It is often hard to give a specific diagnosis for the cause of chest pain. Among other possibilities your symptoms might be caused by inadequate oxygen delivery to your heart (angina). Angina that is not treated or evaluated can lead to a heart attack (myocardial  infarction) or death. Blood tests, electrocardiograms, and X-rays may have been done to help determine a possible cause of your chest pain. After evaluation and observation, your health care provider has determined that it is unlikely your pain was caused by an unstable condition that requires hospitalization. However, a full evaluation of your pain may need to be completed, with additional diagnostic testing as directed. It is very important to keep your follow-up appointments. Not keeping your follow-up appointments could result in permanent heart damage, disability, or death. If there is any problem keeping your follow-up appointments, you must call your health care provider. HOME CARE INSTRUCTIONS  Due to the slight chance that your pain could be angina, it is important to follow your health care provider's treatment plan and also maintain a healthy lifestyle:  Maintain or work toward achieving a healthy weight.  Stay physically active and exercise regularly.  Decrease your salt intake.  Eat a balanced, healthy diet. Talk to a dietitian to learn about heart-healthy foods.  Increase your fiber intake by including whole grains, vegetables, fruits, and nuts in your diet.  Avoid situations that cause stress, anger, or depression.  Take medicines as advised by your health care provider. Report any side effects to your health care provider. Do not stop medicines or adjust the dosages on your own.  Quit smoking. Do not use nicotine patches or gum until you check with your health care provider.  Keep your blood pressure, blood sugar, and cholesterol levels within normal limits.  Limit alcohol intake to no more than 1 drink per day for women who are not pregnant  and 2 drinks per day for men.  Do not abuse drugs. SEEK IMMEDIATE MEDICAL CARE IF: You have severe chest pain or pressure which may include symptoms such as:  You feel pain or pressure in your arms, neck, jaw, or back.  You have  severe back or abdominal pain, feel sick to your stomach (nauseous), or throw up (vomit).  You are sweating profusely.  You are having a fast or irregular heartbeat.  You feel short of breath while at rest.  You notice increasing shortness of breath during rest, sleep, or with activity.  You have chest pain that does not get better after rest or after taking your usual medicine.  You wake from sleep with chest pain.  You are unable to sleep because you cannot breathe.  You develop a frequent cough or you are coughing up blood.  You feel dizzy, faint, or experience extreme fatigue.  You develop severe weakness, dizziness, fainting, or chills. Any of these symptoms may represent a serious problem that is an emergency. Do not wait to see if the symptoms will go away. Call your local emergency services (911 in the [place] Do not drive yourself to the hospital. MAKE SURE YOU:  Understand these instructions.  Will watch your condition.  Will get help right away if you are not doing well or get worse.   This information is not intended to replace advice given to you by your health care provider. Make sure you discuss any questions you have with your health care provider.   Document Released: 05/26/2010 Document Revised: 04/28/2013 Document Reviewed: 10/23/2012 Elsevier Interactive Patient Education [employer].

## 2015-10-29 NOTE — Progress Notes (Signed)
Pt ambulated well in hallway several times this shift with no complaints of pain at any time. Rachael Fee, RN

## 2015-10-30 NOTE — Discharge Summary (Signed)
Alexander Tran, 65 y.o., DOB [birth year], MRN [id number]. Admission date: 10/28/2015 Discharge Date 10/30/2015 Primary MD No primary care provider on file. Admitting Physician Saundra Shelling, MD  Admission Diagnosis  Chest pain, unspecified chest pain type [R07.9]  Discharge Diagnosis   Principal Problem:   Atypical Chest pain   Diabetes type 2   Essential  hypertension   Hyponatremia  Hypothyroidism  Hyperlipidemia        Hospital Course patient is a 65 year old white male presented with chest pain. He was evaluated in the emergency room with EKG and cardiac enzymes which were unrevealing. He was admitted to the hospital and underwent a stress test. His stress test showed no evidence of ischemia. He was cleared by cardiology for discharge however he continue complain of pain therefore Toradol was tried. His symptoms resolved. He does have multiple risk factors if he experiences chest pain again will need cardiac catheterization. I have asked him to see cardiology on Tuesday to see if further evaluation needs to be done.          Consults  None  Significant Tests:  See full reports for all details      Ct Angio Chest Pe W Or Wo Contrast  10/28/2015  CLINICAL DATA:  Chest pain with exertion, negative stress test, diabetes mellitus, smoker, hypertension, hyperlipidemia, history testicular cancer EXAM: CT ANGIOGRAPHY CHEST WITH CONTRAST TECHNIQUE: Multidetector CT imaging of the chest was performed using the standard protocol during bolus administration of intravenous contrast. Multiplanar CT image reconstructions and MIPs were obtained to evaluate the vascular anatomy. CONTRAST:  100 cc Isovue 370 IV COMPARISON:  None FINDINGS: Cardiovascular: Atherosclerotic calcifications aorta and coronary arteries. Aorta normal caliber without aneurysm or dissection. Pulmonary arteries well opacified and patent. No evidence of pulmonary embolism. Mediastinum: Scattered normal size mediastinal and  hilar lymph nodes. Anterior mediastinal clear. Esophagus unremarkable. Lungs/Pleura: Scattered mild emphysematous changes. 5 mm RIGHT mid lung nodule in superior segment RIGHT lower lobe image 73. Minimal subsegmental atelectasis LEFT lower lobe. Remaining lungs clear. No pulmonary infiltrate, pleural effusion or pneumothorax. Upper Abdomen: Minimal cortical thinning at upper poles of both kidneys. Remaining visualized upper abdomen unremarkable. Musculoskeletal: Old appearing superior endplate height loss of T1 vertebral body. No acute osseous abnormalities. Review of the MIP images confirms the above findings. IMPRESSION: No evidence of pulmonary embolism. Aortic atherosclerosis. Coronary artery disease. Mild old superior endplate compression fracture T1. Emphysematous changes with 5 mm RIGHT lower lobe nodule, recommendation below. No follow-up needed if patient is low-risk. Non-contrast chest CT can be considered in 12 months if patient is high-risk. This recommendation follows the consensus statement: Guidelines for Management of Incidental Pulmonary Nodules Detected on CT Images:From the Fleischner Society 2017; published online before print (10.1148/radiol.SG:5268862). Electronically Signed   By: Lavonia Dana M.D.   On: 10/28/2015 15:27   Nm Myocar Multi W/spect W/wall Motion / Ef  10/28/2015   There was no ST segment deviation noted during stress.  Defect 1: There is a small defect of mild severity present in the apical anterior location.  Findings consistent with prior myocardial infarction.  This is a low risk study.  The left ventricular ejection fraction is mildly decreased (45-54%).    Dg Chest Portable 1 View  10/28/2015  CLINICAL DATA:  Acute onset of left-sided chest pain with exertion, radiating to the right side of the chest. Initial encounter. EXAM: PORTABLE CHEST 1 VIEW COMPARISON:  None. FINDINGS: The lungs are well-aerated. Pulmonary vascularity is at the upper limits of  normal. Mild  left basilar opacity likely reflects atelectasis. There is no evidence of pleural effusion or pneumothorax. The cardiomediastinal silhouette is within normal limits. No acute osseous abnormalities are seen. IMPRESSION: Mild left basilar opacity likely reflects atelectasis. Lungs otherwise clear. Electronically Signed   By: Garald Balding M.D.   On: 10/28/2015 01:29       Today   Subjective:   Alexander Tran   patient feeling better denies any chest pains Objective:   Blood pressure 127/59, pulse 59, temperature 97.8 F (36.6 C), temperature source Oral, resp. rate 16, height 6\' 5"  (1.956 m), weight 121.564 kg (268 lb), SpO2 96 %.  . No intake or output data in the 24 hours ending 10/30/15 1332  Exam VITAL SIGNS: Blood pressure 127/59, pulse 59, temperature 97.8 F (36.6 C), temperature source Oral, resp. rate 16, height 6\' 5"  (1.956 m), weight 121.564 kg (268 lb), SpO2 96 %.  GENERAL:  65 y.o.-year-old patient lying in the bed with no acute distress.  EYES: Pupils equal, round, reactive to light and accommodation. No scleral icterus. Extraocular muscles intact.  HEENT: Head atraumatic, normocephalic. Oropharynx and nasopharynx clear.  NECK:  Supple, no jugular venous distention. No thyroid enlargement, no tenderness.  LUNGS: Normal breath sounds bilaterally, no wheezing, rales,rhonchi or crepitation. No use of accessory muscles of respiration.  CARDIOVASCULAR: S1, S2 normal. No murmurs, rubs, or gallops.  ABDOMEN: Soft, nontender, nondistended. Bowel sounds present. No organomegaly or mass.  EXTREMITIES: No pedal edema, cyanosis, or clubbing.  NEUROLOGIC: Cranial nerves II through XII are intact. Muscle strength 5/5 in all extremities. Sensation intact. Gait not checked.  PSYCHIATRIC: The patient is alert and oriented x 3.  SKIN: No obvious rash, lesion, or ulcer.   Data Review     CBC w Diff:  Lab Results  Component Value Date   WBC 8.6 10/28/2015   HGB 14.1 10/28/2015   HCT  39.6* 10/28/2015   PLT 192 10/28/2015   CMP:  Lab Results  Component Value Date   NA 130* 10/28/2015   K 3.8 10/28/2015   CL 97* 10/28/2015   CO2 24 10/28/2015   BUN 12 10/28/2015   CREATININE 0.99 10/28/2015  .  Micro Results No results found for this or any previous visit (from the past 240 hour(s)).   Code Status History    Date Active Date Inactive Code Status Order ID Comments User Context   10/28/2015  5:52 AM 10/29/2015  2:04 PM Full Code [id number]  Saundra Shelling, MD Inpatient          Follow-up Information    Follow up with pcp In 7 days.      Follow up with PARASCHOS,ALEXANDER, MD In 4 days.   Specialty:  Cardiology   Contact information:   [facility] [place] [address] [phone]       Discharge Medications     Medication List    TAKE these medications        aspirin 325 MG tablet  Take 325 mg by mouth once.     glipiZIDE 10 MG 24 hr tablet  Commonly known as:  GLUCOTROL XL  Take 10 mg by mouth daily.     lamoTRIgine 200 MG tablet  Commonly known as:  LAMICTAL  Take 1 tablet by mouth daily.     levothyroxine 100 MCG tablet  Commonly known as:  SYNTHROID, LEVOTHROID  Take 100 mcg by mouth every morning.     lisinopril 20 MG  tablet  Commonly known as:  PRINIVIL,ZESTRIL  Take 20 mg by mouth daily.     metFORMIN 1000 MG tablet  Commonly known as:  GLUCOPHAGE  Take 1,000 mg by mouth daily.     nitroGLYCERIN 0.4 MG SL tablet  Commonly known as:  NITROSTAT  Place 1 tablet (0.4 mg total) under the tongue every 5 (five) minutes as needed for chest pain.     pioglitazone 30 MG tablet  Commonly known as:  ACTOS  Take 30 mg by mouth daily.     sertraline 50 MG tablet  Commonly known as:  ZOLOFT  Take 50 mg by mouth daily.     simvastatin 40 MG tablet  Commonly known as:  ZOCOR  Take 40 mg by mouth at bedtime.           Total Time in preparing paper work, data evaluation and todays  exam - 35 minutes  Dustin Flock M.D on 10/30/2015 at 1:32 PM  [facility]   Office  [phone]

## 2015-11-01 ENCOUNTER — Other Ambulatory Visit: Payer: Self-pay

## 2015-11-01 ENCOUNTER — Encounter: Payer: Self-pay | Admitting: Emergency Medicine

## 2015-11-01 ENCOUNTER — Observation Stay
Admission: EM | Admit: 2015-11-01 | Discharge: 2015-11-02 | Disposition: A | Discharge status: Home / Self Care | Payer: BLUE CROSS/BLUE SHIELD | Attending: Internal Medicine | Admitting: Internal Medicine

## 2015-11-01 DIAGNOSIS — Z8547 Personal history of malignant neoplasm of testis: Secondary | ICD-10-CM | POA: Diagnosis not present

## 2015-11-01 DIAGNOSIS — I2510 Atherosclerotic heart disease of native coronary artery without angina pectoris: Principal | ICD-10-CM | POA: Insufficient documentation

## 2015-11-01 DIAGNOSIS — Z8249 Family history of ischemic heart disease and other diseases of the circulatory system: Secondary | ICD-10-CM | POA: Diagnosis not present

## 2015-11-01 DIAGNOSIS — E119 Type 2 diabetes mellitus without complications: Secondary | ICD-10-CM | POA: Diagnosis not present

## 2015-11-01 DIAGNOSIS — F17210 Nicotine dependence, cigarettes, uncomplicated: Secondary | ICD-10-CM | POA: Insufficient documentation

## 2015-11-01 DIAGNOSIS — I10 Essential (primary) hypertension: Secondary | ICD-10-CM | POA: Insufficient documentation

## 2015-11-01 DIAGNOSIS — Z79899 Other long term (current) drug therapy: Secondary | ICD-10-CM | POA: Diagnosis not present

## 2015-11-01 DIAGNOSIS — I259 Chronic ischemic heart disease, unspecified: Secondary | ICD-10-CM | POA: Diagnosis not present

## 2015-11-01 DIAGNOSIS — Z7982 Long term (current) use of aspirin: Secondary | ICD-10-CM | POA: Insufficient documentation

## 2015-11-01 DIAGNOSIS — E785 Hyperlipidemia, unspecified: Secondary | ICD-10-CM | POA: Diagnosis not present

## 2015-11-01 DIAGNOSIS — R079 Chest pain, unspecified: Secondary | ICD-10-CM | POA: Diagnosis present

## 2015-11-01 DIAGNOSIS — Z7984 Long term (current) use of oral hypoglycemic drugs: Secondary | ICD-10-CM | POA: Diagnosis not present

## 2015-11-01 DIAGNOSIS — Z91048 Other nonmedicinal substance allergy status: Secondary | ICD-10-CM | POA: Diagnosis not present

## 2015-11-01 DIAGNOSIS — F319 Bipolar disorder, unspecified: Secondary | ICD-10-CM | POA: Diagnosis not present

## 2015-11-01 LAB — BASIC METABOLIC PANEL
Anion gap: 8 (ref 5–15)
BUN: 17 mg/dL (ref 6–20)
CALCIUM: 9.4 mg/dL (ref 8.9–10.3)
CO2: 27 mmol/L (ref 22–32)
CREATININE: 1.28 mg/dL — AB (ref 0.61–1.24)
Chloride: 100 mmol/L — ABNORMAL LOW (ref 101–111)
GFR calc Af Amer: >60 mL/min (ref >60)
GFR, EST NON AFRICAN AMERICAN: 58 mL/min — AB (ref >60)
GLUCOSE: 207 mg/dL — AB (ref 65–99)
Potassium: 4.7 mmol/L (ref 3.5–5.1)
Sodium: 135 mmol/L (ref 135–145)

## 2015-11-01 LAB — TROPONIN I

## 2015-11-01 LAB — GLUCOSE, CAPILLARY
GLUCOSE-CAPILLARY: 189 mg/dL — AB (ref 65–99)
Glucose-Capillary: 220 mg/dL — ABNORMAL HIGH (ref 65–99)
Glucose-Capillary: 155 mg/dL — ABNORMAL HIGH (ref 65–99)

## 2015-11-01 LAB — CBC
HCT: 40.5 % (ref 40.0–52.0)
Hemoglobin: 14.2 g/dL (ref 13.0–18.0)
MCH: 33.8 pg (ref 26.0–34.0)
MCHC: 35.1 g/dL (ref 32.0–36.0)
MCV: 96.3 fL (ref 80.0–100.0)
Platelets: 208 10*3/uL (ref 150–440)
RBC: 4.21 MIL/uL — ABNORMAL LOW (ref 4.40–5.90)
RDW: 16.3 % — AB (ref 11.5–14.5)
WBC: 8 10*3/uL (ref 3.8–10.6)

## 2015-11-01 LAB — HEMOGLOBIN A1C: HEMOGLOBIN A1C: 12.6 % — AB (ref 4.0–6.0)

## 2015-11-01 MED ORDER — INSULIN ASPART 100 UNIT/ML ~~LOC~~ SOLN: 0.0000 [IU] | Freq: Every day | SUBCUTANEOUS | Status: DC

## 2015-11-01 MED ORDER — LAMOTRIGINE 100 MG PO TABS
200.0000 mg | ORAL_TABLET | Freq: Every day | ORAL | Status: DC
  Administered 2015-11-02: 200 mg via ORAL
  Filled 2015-11-01: qty 8

## 2015-11-01 MED ORDER — ENOXAPARIN SODIUM 40 MG/0.4ML ~~LOC~~ SOLN
40.0000 mg | SUBCUTANEOUS | Status: DC
  Administered 2015-11-01: 40 mg via SUBCUTANEOUS
  Filled 2015-11-01: qty 0.4

## 2015-11-01 MED ORDER — SERTRALINE HCL 50 MG PO TABS
50.0000 mg | ORAL_TABLET | Freq: Every day | ORAL | Status: DC
  Administered 2015-11-02: 50 mg via ORAL
  Filled 2015-11-01 (×2): qty 1

## 2015-11-01 MED ORDER — POLYETHYLENE GLYCOL 3350 17 G PO PACK: 17.0000 g | PACK | Freq: Every day | ORAL | Status: DC | PRN

## 2015-11-01 MED ORDER — ACETAMINOPHEN 325 MG PO TABS
650.0000 mg | ORAL_TABLET | Freq: Four times a day (QID) | ORAL | Status: DC | PRN
Start: 2015-11-01 — End: 2015-11-02

## 2015-11-01 MED ORDER — SODIUM CHLORIDE 0.9% FLUSH
3.0000 mL | Freq: Two times a day (BID) | INTRAVENOUS | Status: DC
Start: 2015-11-01 — End: 2015-11-02
  Administered 2015-11-01 (×2): 3 mL via INTRAVENOUS

## 2015-11-01 MED ORDER — INSULIN ASPART 100 UNIT/ML ~~LOC~~ SOLN
0.0000 [IU] | Freq: Three times a day (TID) | SUBCUTANEOUS | Status: DC
  Administered 2015-11-01: 3 [IU] via SUBCUTANEOUS
  Administered 2015-11-01: 5 [IU] via SUBCUTANEOUS
  Administered 2015-11-02: 8 [IU] via SUBCUTANEOUS
  Filled 2015-11-01: qty 5
  Filled 2015-11-01: qty 8

## 2015-11-01 MED ORDER — ONDANSETRON HCL 4 MG PO TABS: 4.0000 mg | ORAL_TABLET | Freq: Four times a day (QID) | ORAL | Status: DC | PRN

## 2015-11-01 MED ORDER — ACETAMINOPHEN 650 MG RE SUPP
650.0000 mg | Freq: Four times a day (QID) | RECTAL | Status: DC | PRN
Start: 2015-11-01 — End: 2015-11-02

## 2015-11-01 MED ORDER — LEVOTHYROXINE SODIUM 100 MCG PO TABS: 100.0000 ug | ORAL_TABLET | Freq: Every day | ORAL | Status: DC

## 2015-11-01 MED ORDER — GLIPIZIDE ER 10 MG PO TB24
10.0000 mg | ORAL_TABLET | Freq: Every day | ORAL | Status: DC
  Administered 2015-11-02: 10 mg via ORAL
  Filled 2015-11-01: qty 1

## 2015-11-01 MED ORDER — ASPIRIN EC 81 MG PO TBEC
81.0000 mg | DELAYED_RELEASE_TABLET | Freq: Every day | ORAL | Status: DC
  Administered 2015-11-01 – 2015-11-02 (×2): 81 mg via ORAL
  Filled 2015-11-01 (×3): qty 1

## 2015-11-01 MED ORDER — SIMVASTATIN 40 MG PO TABS
40.0000 mg | ORAL_TABLET | Freq: Every day | ORAL | Status: DC
  Administered 2015-11-01: 40 mg via ORAL
  Filled 2015-11-01: qty 1

## 2015-11-01 MED ORDER — ALBUTEROL SULFATE (2.5 MG/3ML) 0.083% IN NEBU
2.5000 mg | INHALATION_SOLUTION | RESPIRATORY_TRACT | Status: DC | PRN
Start: 2015-11-01 — End: 2015-11-02

## 2015-11-01 MED ORDER — NICOTINE 14 MG/24HR TD PT24
14.0000 mg | MEDICATED_PATCH | Freq: Every day | TRANSDERMAL | Status: DC
  Administered 2015-11-01: 14 mg via TRANSDERMAL
  Filled 2015-11-01 (×2): qty 1

## 2015-11-01 MED ORDER — NITROGLYCERIN 0.4 MG SL SUBL
0.4000 mg | SUBLINGUAL_TABLET | SUBLINGUAL | Status: DC | PRN
Start: 2015-11-01 — End: 2015-11-02

## 2015-11-01 MED ORDER — PIOGLITAZONE HCL 15 MG PO TABS
30.0000 mg | ORAL_TABLET | Freq: Every day | ORAL | Status: DC
  Administered 2015-11-02: 30 mg via ORAL
  Filled 2015-11-01: qty 1

## 2015-11-01 MED ORDER — ONDANSETRON HCL 4 MG/2ML IJ SOLN: 4.0000 mg | Freq: Four times a day (QID) | INTRAMUSCULAR | Status: DC | PRN

## 2015-11-01 MED ORDER — SODIUM CHLORIDE 0.9 % IV BOLUS (SEPSIS)
500.0000 mL | Freq: Once | INTRAVENOUS | Status: AC
  Administered 2015-11-01: 500 mL via INTRAVENOUS

## 2015-11-01 MED ORDER — LISINOPRIL 20 MG PO TABS
20.0000 mg | ORAL_TABLET | Freq: Every day | ORAL | Status: DC
  Administered 2015-11-02: 20 mg via ORAL
  Filled 2015-11-01: qty 2

## 2015-11-01 MED ORDER — BISACODYL 5 MG PO TBEC: 5.0000 mg | DELAYED_RELEASE_TABLET | Freq: Every day | ORAL | Status: DC | PRN

## 2015-11-01 NOTE — Care Management (Signed)
Second observation event since 6/23.  Patient was to have had outpatient cardiac cath tomorrow but present thinking it was for today.  During this presentation, he had syncope event and placed in observation.  At present, there are no discharge needs identified

## 2015-11-01 NOTE — ED Notes (Addendum)
Pt to ED this morning with chest pain and reports "passing out" last night for approx 30 seconds.  Pt states chest discomfort only occurs when he stands and it stops when he sits.  Pt was admitted 6 days ago for chest pain workup and was discharged 3 days ago.  Scheduled for heart cath tomorrow morning.

## 2015-11-01 NOTE — ED Provider Notes (Signed)
[facility] Emergency Department Provider Note  ____________________________________________    I have reviewed the triage vital signs and the nursing notes.   HISTORY  Chief Complaint Chest Pain and Loss of Consciousness    HPI Alexander Tran is a 65 y.o. male who presents with exertional chest pain and syncope. He was recently admitted to [facility] and scheduled for outpatient cath tomorrow. Apparently last night he had a witnessed syncope. He continues to have chest pain with exertion but none at rest. No injuries from fall. No SOB. No fevers/chills. No palpitations     Past Medical History  Diagnosis Date  . Diabetes mellitus without complication ([place])   . 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[place])   . Manic depression ([place])   . Hyperlipidemia   . Hypertension     Patient Active Problem List   Diagnosis Date Noted  . Chest pain 10/28/2015    Past Surgical History  Procedure Laterality Date  . Back surgery    . Surgery scrotal / testicular      Current Outpatient Rx  Name  Route  Sig  Dispense  Refill  . aspirin 325 MG tablet   Oral   Take 325 mg by mouth once.         Marland Kitchen glipiZIDE (GLUCOTROL XL) 10 MG 24 hr tablet   Oral   Take 10 mg by mouth daily.         Marland Kitchen lamoTRIgine (LAMICTAL) 200 MG tablet   Oral   Take 1 tablet by mouth daily.         Marland Kitchen levothyroxine (SYNTHROID, LEVOTHROID) 100 MCG tablet   Oral   Take 100 mcg by mouth every morning.         Marland Kitchen lisinopril (PRINIVIL,ZESTRIL) 20 MG tablet   Oral   Take 20 mg by mouth daily.         . metFORMIN (GLUCOPHAGE) 1000 MG tablet   Oral   Take 1,000 mg by mouth daily.         . nitroGLYCERIN (NITROSTAT) 0.4 MG SL tablet   Sublingual   Place 1 tablet (0.4 mg total) under the tongue every 5 (five) minutes as needed for chest pain.   15 tablet   12   . pioglitazone (ACTOS) 30 MG tablet   Oral   Take 30 mg by mouth daily.         . sertraline (ZOLOFT) 50 MG tablet   Oral   Take 50 mg  by mouth daily.         . simvastatin (ZOCOR) 40 MG tablet   Oral   Take 40 mg by mouth at bedtime.           Allergies Other  Family History  Problem Relation Age of Onset  . Coronary artery disease Father     Social History Social History  Substance Use Topics  . Smoking status: Current Every Day Smoker -- 1.00 packs/day    Types: Cigarettes  . Smokeless tobacco: None  . Alcohol Use: 0.0 oz/week    0 Standard drinks or equivalent per week     Comment: one glass of wine daily    Review of Systems  Constitutional: Negative for fever. Eyes: Negative for redness ENT: Negative for sore throat Cardiovascular: exertional chest pain Respiratory: Negative for shortness of breath. Gastrointestinal: Negative for abdominal pain Genitourinary: Negative for dysuria. Musculoskeletal: Negative for back pain. Skin: Negative for rash. Neurological: Negative for focal weakness Psychiatric: +anxiety    ____________________________________________  PHYSICAL EXAM:  VITAL SIGNS: ED Triage Vitals  Enc Vitals Group     BP 11/01/15 0930 126/74 mmHg     Pulse Rate 11/01/15 0930 89     Resp 11/01/15 0930 18     Temp 11/01/15 0930 97.7 F (36.5 C)     Temp Source 11/01/15 0930 Oral     SpO2 11/01/15 0930 95 %     Weight 11/01/15 0930 268 lb (121.564 kg)     Height 11/01/15 0930 6\' 5"  (1.956 m)     Head Cir --      Peak Flow --      Pain Score 11/01/15 0932 10     Pain Loc --      Pain Edu? --      Excl. in [place]? --     Constitutional: Alert and oriented. Well appearing and in no distress.  Eyes: Conjunctivae are normal. No erythema or injection ENT   Head: Normocephalic and atraumatic.   Mouth/Throat: Mucous membranes are moist. Cardiovascular: Normal rate, regular rhythm. Normal and symmetric distal pulses are present in the upper extremities. No murmurs or rubs  Respiratory: Normal respiratory effort without tachypnea nor retractions. Breath sounds are clear  and equal bilaterally.  Gastrointestinal: Soft and non-tender in all quadrants. No distention. There is no CVA tenderness. Genitourinary: deferred Musculoskeletal: Nontender with normal range of motion in all extremities. No lower extremity tenderness nor edema. Neurologic:  Normal speech and language. No gross focal neurologic deficits are appreciated. Skin:  Skin is warm, dry and intact. No rash noted. Psychiatric: Mood and affect are normal. Patient exhibits appropriate insight and judgment.  ____________________________________________    LABS (pertinent positives/negatives)  Labs Reviewed  BASIC METABOLIC PANEL - Abnormal; Notable for the following:    Chloride 100 (*)    Glucose, Bld 207 (*)    Creatinine, Ser 1.28 (*)    GFR calc non Af Amer 58 (*)    All other components within normal limits  CBC - Abnormal; Notable for the following:    RBC 4.21 (*)    RDW 16.3 (*)    All other components within normal limits  TROPONIN I    ____________________________________________   EKG  ED ECG REPORT I, Lavonia Drafts, the attending physician, personally viewed and interpreted this ECG.  Date: 11/01/2015 EKG Time: 926 Rate: 87 Rhythm: normal sinus rhythm QRS Axis: normal Intervals: normal ST/T Wave abnormalities: normal Conduction Disturbances: Right bundle branch block Narrative Interpretation: Unchanged from prior EKG   ____________________________________________    RADIOLOGY  None  ____________________________________________   PROCEDURES  Procedure(s) performed: none  Critical Care performed: none  ____________________________________________   INITIAL IMPRESSION / ASSESSMENT AND PLAN / ED COURSE  Pertinent labs & imaging results that were available during my care of the patient were reviewed by me and considered in my medical decision making (see chart for details).  Discussed with Dr. Ubaldo Glassing of cardiology who recommends admission for  catheterization.  ____________________________________________   FINAL CLINICAL IMPRESSION(S) / ED DIAGNOSES  Final diagnoses:  Chest pain, unspecified chest pain type          Lavonia Drafts, MD 11/01/15 1041

## 2015-11-01 NOTE — Care Management (Signed)
Second observation event since 6/23. Patient was to have had outpatient cardiac cath tomorrow but present thinking it was for today. During this presentation, he had syncope event and placed in observation. At present, there are no discharge needs identified

## 2015-11-01 NOTE — Progress Notes (Signed)
Patient admitted to unit from ER for chest pain, patient presently resting in the room, denies chest pain or discomfort, patient admitted for cardiac cath  Tomorrow,

## 2015-11-01 NOTE — H&P (Signed)
[place] [facility] [place] NAME: Alexander Tran    MR#:  [id number]  DATE OF BIRTH:  11-Nov-1950  DATE OF ADMISSION:  11/01/2015  PRIMARY CARE PHYSICIAN: No primary care provider on file.   REQUESTING/REFERRING PHYSICIAN: Dr. Corky Downs  CHIEF COMPLAINT:   Chief Complaint  Patient presents with  . Chest Pain  . Loss of Consciousness    HISTORY OF PRESENT ILLNESS:  Alexander Tran  is a 65 y.o. male with a known history of Hypertension, diabetes, tobacco abuse presents to the emergency room with complaints of exertional chest pain and one episode of syncope overnight. Patient was recently admitted to the hospital for chest pain and had a stress test which showed no ST changes. Echocardiogram showed ejection fraction of 60% with mild MR but nothing significant. CT of the chest showed no PE. Patient was discharged home to follow-up with cardiology who had scheduled him for an elective cardiac catheterization. Patient was supposed to have his cardiac catheterization tomorrow. Patient presented to the hospital today mistakenly thinking his cardiac catheterization scheduled for today. He was sent to the emergency room due to the episode of syncope and ongoing exertional chest pain. Here his troponin is normal. EKG is unchanged.   PAST MEDICAL HISTORY:   Past Medical History  Diagnosis Date  . Diabetes mellitus without complication ([place])   . 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[place])   . Manic depression ([place])   . Hyperlipidemia   . Hypertension     PAST SURGICAL HISTORY:   Past Surgical History  Procedure Laterality Date  . Back surgery    . Surgery scrotal / testicular      SOCIAL HISTORY:   Social History  Substance Use Topics  . Smoking status: Current Every Day Smoker -- 1.00 packs/day    Types: Cigarettes  . Smokeless tobacco: Not on file  . Alcohol Use: 0.0 oz/week    0 Standard drinks or equivalent per week     Comment: one glass of wine daily     FAMILY HISTORY:   Family History  Problem Relation Age of Onset  . Coronary artery disease Father     DRUG ALLERGIES:   Allergies  Allergen Reactions  . Other Other (See Comments)    Uncoded Allergy. Allergen: bandaid adhesive, Other Reaction: Contac Dermatitis    REVIEW OF SYSTEMS:   Review of Systems  Constitutional: Positive for malaise/fatigue. Negative for fever, chills and weight loss.  HENT: Negative for hearing loss and nosebleeds.   Eyes: Negative for blurred vision, double vision and pain.  Respiratory: Negative for cough, hemoptysis, sputum production, shortness of breath and wheezing.   Cardiovascular: Positive for chest pain. Negative for palpitations, orthopnea and leg swelling.  Gastrointestinal: Negative for nausea, vomiting, abdominal pain, diarrhea and constipation.  Genitourinary: Negative for dysuria and hematuria.  Musculoskeletal: Negative for myalgias, back pain and falls.  Skin: Negative for rash.  Neurological: Positive for loss of consciousness. Negative for dizziness, tremors, sensory change, speech change, focal weakness, seizures and headaches.  Endo/Heme/Allergies: Does not bruise/bleed easily.  Psychiatric/Behavioral: Negative for depression and memory loss. The patient is not nervous/anxious.     MEDICATIONS AT HOME:   Prior to Admission medications   Medication Sig Start Date End Date Taking? Authorizing Provider  aspirin 325 MG tablet Take 325 mg by mouth once.   Yes Historical Provider, MD  glipiZIDE (GLUCOTROL XL) 10 MG 24 hr tablet Take 10 mg by mouth daily. 09/03/14  Yes Historical  Provider, MD  lamoTRIgine (LAMICTAL) 200 MG tablet Take 1 tablet by mouth daily. 01/12/14  Yes Historical Provider, MD  levothyroxine (SYNTHROID, LEVOTHROID) 100 MCG tablet Take 100 mcg by mouth every morning. 12/16/14  Yes Historical Provider, MD  lisinopril (PRINIVIL,ZESTRIL) 20 MG tablet Take 20 mg by mouth daily. 03/24/15  Yes Historical Provider, MD   metFORMIN (GLUCOPHAGE) 1000 MG tablet Take 1,000 mg by mouth 2 (two) times daily with a meal.  09/03/14  Yes Historical Provider, MD  nitroGLYCERIN (NITROSTAT) 0.4 MG SL tablet Place 1 tablet (0.4 mg total) under the tongue every 5 (five) minutes as needed for chest pain. 10/29/15  Yes Dustin Flock, MD  pioglitazone (ACTOS) 30 MG tablet Take 30 mg by mouth daily. 12/16/14  Yes Historical Provider, MD  sertraline (ZOLOFT) 50 MG tablet Take 50 mg by mouth daily. 01/12/14  Yes Historical Provider, MD  simvastatin (ZOCOR) 40 MG tablet Take 40 mg by mouth at bedtime. 12/16/14  Yes Historical Provider, MD     VITAL SIGNS:  Blood pressure 131/63, pulse 77, temperature 97.7 F (36.5 C), temperature source Oral, resp. rate 5, height 6\' 5"  (1.956 m), weight 121.564 kg (268 lb), SpO2 97 %.  PHYSICAL EXAMINATION:  Physical Exam  GENERAL:  65 y.o.-year-old patient lying in the bed with no acute distress.  EYES: Pupils equal, round, reactive to light and accommodation. No scleral icterus. Extraocular muscles intact.  HEENT: Head atraumatic, normocephalic. Oropharynx and nasopharynx clear. No oropharyngeal erythema, moist oral mucosa  NECK:  Supple, no jugular venous distention. No thyroid enlargement, no tenderness.  LUNGS: Normal breath sounds bilaterally, no wheezing, rales, rhonchi. No use of accessory muscles of respiration.  CARDIOVASCULAR: S1, S2 normal. No murmurs, rubs, or gallops.  ABDOMEN: Soft, nontender, nondistended. Bowel sounds present. No organomegaly or mass.  EXTREMITIES: No pedal edema, cyanosis, or clubbing. + 2 pedal & radial pulses b/l.   NEUROLOGIC: Cranial nerves II through XII are intact. No focal Motor or sensory deficits appreciated b/l PSYCHIATRIC: The patient is alert and oriented x 3. Good affect.  SKIN: No obvious rash, lesion, or ulcer.   LABORATORY PANEL:   CBC  Recent Labs Lab 11/01/15 0949  WBC 8.0  HGB 14.2  HCT 40.5  PLT 208    ------------------------------------------------------------------------------------------------------------------  Chemistries   Recent Labs Lab 11/01/15 0949  NA 135  K 4.7  CL 100*  CO2 27  GLUCOSE 207*  BUN 17  CREATININE 1.28*  CALCIUM 9.4   ------------------------------------------------------------------------------------------------------------------  Cardiac Enzymes  Recent Labs Lab 11/01/15 0949  TROPONINI <0.03   ------------------------------------------------------------------------------------------------------------------  RADIOLOGY:  No results found.   IMPRESSION AND PLAN:   * Chest pain with syncope Admit to telemetry unit. Repeat troponin. Consult cardiology. Nothing by mouth after midnight and cardiac catheterization tomorrow Continue aspirin and statin Recent echocardiogram, stress test, CTA chest were normal  * Diabetes mellitus type 2 Uncontrolled Check HbA1c Hold metformin due to contrast with cardiac catheterization Sliding scale insulin May need Lantus at discharge depending on his HbA1c.  * Hypertension Continue home medications  * DVT prophylaxis with Lovenox  All the records are reviewed and case discussed with ED provider. Management plans discussed with the patient, family and they are in agreement.  CODE STATUS: FULL CODE  TOTAL TIME TAKING CARE OF THIS PATIENT: 40 minutes.   Hillary Bow R M.D on 11/01/2015 at 11:08 AM  Between 7am to 6pm - Pager - [phone]  After 6pm go to [email or web address] - password [email or web address] [facility]  [facility]  Office  [phone]  CC: Primary care physician; No primary care provider on file.  Note: This dictation was prepared with Dragon dictation along with smaller phrase technology. Any transcriptional errors that result from this process are unintentional.

## 2015-11-02 ENCOUNTER — Encounter: Payer: Self-pay | Admitting: Cardiology

## 2015-11-02 ENCOUNTER — Encounter
Admission: EM | Disposition: A | Discharge status: Home / Self Care | Payer: Self-pay | Source: Home / Self Care | Attending: Emergency Medicine

## 2015-11-02 ENCOUNTER — Ambulatory Visit: Admission: RE | Admit: 2015-11-02 | Payer: BLUE CROSS/BLUE SHIELD | Source: Ambulatory Visit | Admitting: Cardiology

## 2015-11-02 HISTORY — PX: CARDIAC CATHETERIZATION: SHX172

## 2015-11-02 LAB — GLUCOSE, CAPILLARY: GLUCOSE-CAPILLARY: 255 mg/dL — AB (ref 65–99)

## 2015-11-02 LAB — PROTIME-INR
INR: 1.05
PROTHROMBIN TIME: 13.9 s (ref 11.4–15.0)

## 2015-11-02 SURGERY — LEFT HEART CATH AND CORONARY ANGIOGRAPHY
Anesthesia: Moderate Sedation

## 2015-11-02 SURGERY — LEFT HEART CATH AND CORONARY ANGIOGRAPHY
Anesthesia: Moderate Sedation | Laterality: Left

## 2015-11-02 MED ORDER — SODIUM CHLORIDE 0.9 % WEIGHT BASED INFUSION: 1.0000 mL/kg/h | INTRAVENOUS | Status: DC

## 2015-11-02 MED ORDER — SODIUM CHLORIDE 0.9 % IV SOLN: 250.0000 mL | INTRAVENOUS | Status: DC | PRN

## 2015-11-02 MED ORDER — ISOSORBIDE MONONITRATE ER 30 MG PO TB24: 30.0000 mg | ORAL_TABLET | Freq: Every day | ORAL | Status: DC

## 2015-11-02 MED ORDER — SODIUM CHLORIDE 0.9 % WEIGHT BASED INFUSION: 3.0000 mL/kg/h | INTRAVENOUS | Status: DC

## 2015-11-02 MED ORDER — SODIUM CHLORIDE 0.9% FLUSH: 3.0000 mL | Freq: Two times a day (BID) | INTRAVENOUS | Status: DC

## 2015-11-02 MED ORDER — ONDANSETRON HCL 4 MG/2ML IJ SOLN: 4.0000 mg | Freq: Four times a day (QID) | INTRAMUSCULAR | Status: DC | PRN

## 2015-11-02 MED ORDER — HEPARIN (PORCINE) IN NACL 2-0.9 UNIT/ML-% IJ SOLN
INTRAMUSCULAR | Status: AC
  Filled 2015-11-02: qty 1000

## 2015-11-02 MED ORDER — METOPROLOL SUCCINATE ER 25 MG PO TB24
25.0000 mg | ORAL_TABLET | Freq: Every day | ORAL | Status: DC
  Administered 2015-11-02: 25 mg via ORAL
  Filled 2015-11-02: qty 1

## 2015-11-02 MED ORDER — SODIUM CHLORIDE 0.9% FLUSH: 3.0000 mL | INTRAVENOUS | Status: DC | PRN

## 2015-11-02 MED ORDER — ISOSORBIDE MONONITRATE ER 30 MG PO TB24
30.0000 mg | ORAL_TABLET | Freq: Every day | ORAL | Status: DC
  Administered 2015-11-02: 30 mg via ORAL
  Filled 2015-11-02 (×2): qty 1

## 2015-11-02 MED ORDER — MIDAZOLAM HCL 2 MG/2ML IJ SOLN
INTRAMUSCULAR | Status: AC
  Filled 2015-11-02: qty 2

## 2015-11-02 MED ORDER — FENTANYL CITRATE (PF) 100 MCG/2ML IJ SOLN
INTRAMUSCULAR | Status: AC
  Filled 2015-11-02: qty 2

## 2015-11-02 MED ORDER — MIDAZOLAM HCL 2 MG/2ML IJ SOLN
INTRAMUSCULAR | Status: DC | PRN
  Administered 2015-11-02: 1 mg via INTRAVENOUS

## 2015-11-02 MED ORDER — METOPROLOL SUCCINATE ER 25 MG PO TB24: 25.0000 mg | ORAL_TABLET | Freq: Every day | ORAL | Status: DC

## 2015-11-02 MED ORDER — FENTANYL CITRATE (PF) 100 MCG/2ML IJ SOLN
INTRAMUSCULAR | Status: DC | PRN
  Administered 2015-11-02: 25 ug via INTRAVENOUS

## 2015-11-02 MED ORDER — ASPIRIN 81 MG PO CHEW: 81.0000 mg | CHEWABLE_TABLET | ORAL | Status: DC

## 2015-11-02 MED ORDER — IOPAMIDOL (ISOVUE-300) INJECTION 61%
INTRAVENOUS | Status: DC | PRN
  Administered 2015-11-02: 140 mL via INTRA_ARTERIAL

## 2015-11-02 MED ORDER — ACETAMINOPHEN 325 MG PO TABS: 650.0000 mg | ORAL_TABLET | ORAL | Status: DC | PRN

## 2015-11-02 SURGICAL SUPPLY — 9 items
CATH INFINITI 5FR ANG PIGTAIL (CATHETERS) ×3 IMPLANT
CATH INFINITI 5FR JL4 (CATHETERS) ×3 IMPLANT
CATH INFINITI JR4 5F (CATHETERS) ×3 IMPLANT
DEVICE CLOSURE MYNXGRIP 5F (Vascular Products) ×3 IMPLANT
KIT MANI 3VAL PERCEP (MISCELLANEOUS) ×3 IMPLANT
NEEDLE PERC 18GX7CM (NEEDLE) ×3 IMPLANT
PACK CARDIAC CATH (CUSTOM PROCEDURE TRAY) ×3 IMPLANT
SHEATH AVANTI 5FR X 11CM (SHEATH) ×3 IMPLANT
WIRE EMERALD 3MM-J .035X150CM (WIRE) ×3 IMPLANT

## 2015-11-02 NOTE — Progress Notes (Signed)
Patient returned to unit from cath lab, groin minx closure intact no bleeding noted, patient resting in bed at this time.

## 2015-11-02 NOTE — Progress Notes (Signed)
Patient off the unit to cath lab  

## 2015-11-02 NOTE — Discharge Summary (Signed)
[place] at [place] NAME: Alexander Tran    MR#:  [id number]  DATE OF BIRTH:  1950/11/21  DATE OF ADMISSION:  11/01/2015 ADMITTING PHYSICIAN: Hillary Bow, MD  DATE OF DISCHARGE: 11/02/15  PRIMARY CARE PHYSICIAN: No primary care provider on file.    ADMISSION DIAGNOSIS:  Chest pain, unspecified chest pain type [R07.9]  DISCHARGE DIAGNOSIS:  CAD s/p cardiac cath -medical rx HTN DM-2  SECONDARY DIAGNOSIS:   Past Medical History  Diagnosis Date  . Diabetes mellitus without complication ([place])   . 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[place])   . Manic depression ([place])   . Hyperlipidemia   . Hypertension     HOSPITAL COURSE:  * Chest pain with syncope CE x2 negative Consult cardiology appreciated -cath showed 2 vessel dz with good collateral circulation and recommend medical mnx -added toprol XL and Imdur Continue aspirin and statin Recent echocardiogram, stress test, CTA chest were normal  * Diabetes mellitus type 2 Uncontrolled Hold metformin due to contrast with cardiac catheterization Sliding scale insulin A1c elevated. Will defer further aggressive mnx by PCP  * Hypertension Continue home medications  * DVT prophylaxis with Lovenox  D/c home  CONSULTS OBTAINED:     DRUG ALLERGIES:   Allergies  Allergen Reactions  . Other Other (See Comments)    Uncoded Allergy. Allergen: bandaid adhesive, Other Reaction: Contac Dermatitis    DISCHARGE MEDICATIONS:   Current Discharge Medication List    START taking these medications   Details  isosorbide mononitrate (IMDUR) 30 MG 24 hr tablet Take 1 tablet (30 mg total) by mouth daily. Qty: 30 tablet, Refills: 2    metoprolol succinate (TOPROL-XL) 25 MG 24 hr tablet Take 1 tablet (25 mg total) by mouth daily. Qty: 30 tablet, Refills: 2      CONTINUE these medications which have NOT CHANGED   Details  aspirin 325 MG tablet Take 325 mg by mouth once.    glipiZIDE (GLUCOTROL  XL) 10 MG 24 hr tablet Take 10 mg by mouth daily.    lamoTRIgine (LAMICTAL) 200 MG tablet Take 1 tablet by mouth daily.    levothyroxine (SYNTHROID, LEVOTHROID) 100 MCG tablet Take 100 mcg by mouth every morning.    lisinopril (PRINIVIL,ZESTRIL) 20 MG tablet Take 20 mg by mouth daily.    metFORMIN (GLUCOPHAGE) 1000 MG tablet Take 1,000 mg by mouth 2 (two) times daily with a meal.     nitroGLYCERIN (NITROSTAT) 0.4 MG SL tablet Place 1 tablet (0.4 mg total) under the tongue every 5 (five) minutes as needed for chest pain. Qty: 15 tablet, Refills: 12    pioglitazone (ACTOS) 30 MG tablet Take 30 mg by mouth daily.    sertraline (ZOLOFT) 50 MG tablet Take 50 mg by mouth daily.    simvastatin (ZOCOR) 40 MG tablet Take 40 mg by mouth at bedtime.        If you experience worsening of your admission symptoms, develop shortness of breath, life threatening emergency, suicidal or homicidal thoughts you must seek medical attention immediately by calling 911 or calling your MD immediately  if symptoms less severe.  You Must read complete instructions/literature along with all the possible adverse reactions/side effects for all the Medicines you take and that have been prescribed to you. Take any new Medicines after you have completely understood and accept all the possible adverse reactions/side effects.   Please note  You were cared for by a hospitalist during your hospital stay. If you have  any questions about your discharge medications or the care you received while you were in the hospital after you are discharged, you can call the unit and asked to speak with the hospitalist on call if the hospitalist that took care of you is not available. Once you are discharged, your primary care physician will handle any further medical issues. Please note that NO REFILLS for any discharge medications will be authorized once you are discharged, as it is imperative that you return to your primary care physician  (or establish a relationship with a primary care physician if you do not have one) for your aftercare needs so that they can reassess your need for medications and monitor your lab values. Today   SUBJECTIVE   Doing well no complaints  VITAL SIGNS:  Blood pressure 134/72, pulse 62, temperature 97.9 F (36.6 C), temperature source Oral, resp. rate 19, height 6\' 5"  (1.956 m), weight 121.655 kg (268 lb 3.2 oz), SpO2 93 %.  I/O:   Intake/Output Summary (Last 24 hours) at 11/02/15 1251 Last data filed at 11/02/15 0543  Gross per 24 hour  Intake    720 ml  Output   1200 ml  Net   -480 ml    PHYSICAL EXAMINATION:  GENERAL:  65 y.o.-year-old patient lying in the bed with no acute distress.  EYES: Pupils equal, round, reactive to light and accommodation. No scleral icterus. Extraocular muscles intact.  HEENT: Head atraumatic, normocephalic. Oropharynx and nasopharynx clear.  NECK:  Supple, no jugular venous distention. No thyroid enlargement, no tenderness.  LUNGS: Normal breath sounds bilaterally, no wheezing, rales,rhonchi or crepitation. No use of accessory muscles of respiration.  CARDIOVASCULAR: S1, S2 normal. No murmurs, rubs, or gallops.  ABDOMEN: Soft, non-tender, non-distended. Bowel sounds present. No organomegaly or mass.  EXTREMITIES: No pedal edema, cyanosis, or clubbing.  NEUROLOGIC: Cranial nerves II through XII are intact. Muscle strength 5/5 in all extremities. Sensation intact. Gait not checked.  PSYCHIATRIC: The patient is alert and oriented x 3.  SKIN: No obvious rash, lesion, or ulcer.   DATA REVIEW:   CBC   Recent Labs Lab 11/01/15 0949  WBC 8.0  HGB 14.2  HCT 40.5  PLT 208    Chemistries   Recent Labs Lab 11/01/15 0949  NA 135  K 4.7  CL 100*  CO2 27  GLUCOSE 207*  BUN 17  CREATININE 1.28*  CALCIUM 9.4    Microbiology Results   No results found for this or any previous visit (from the past 240 hour(s)).  RADIOLOGY:  No results  found.   Management plans discussed with the patient, family and they are in agreement.  CODE STATUS:     Code Status Orders        Start     Ordered   11/01/15 1045  Full code   Continuous     11/01/15 1046    Code Status History    Date Active Date Inactive Code Status Order ID Comments User Context   10/28/2015  5:52 AM 10/29/2015  2:04 PM Full Code [id number]  Saundra Shelling, MD Inpatient      TOTAL TIME TAKING CARE OF THIS PATIENT: 40 minutes.    Zeriah Baysinger M.D on 11/02/2015 at 12:51 PM  Between 7am to 6pm - Pager - [phone] After 6pm go to [email or web address] - password [email or web address] [facility]  [facility]  Office  [phone]  CC: Primary care physician; No primary care provider on file.

## 2015-11-02 NOTE — Progress Notes (Signed)
Inpatient Diabetes Program Recommendations  AACE/ADA: New Consensus Statement on Inpatient Glycemic Control (2015)  Target Ranges:  Prepandial:   less than 140 mg/dL      Peak postprandial:   less than 180 mg/dL (1-2 hours)      Critically ill patients:  140 - 180 mg/dL  Results for DENALI, GOTTO (MRN [id number]) as of 11/02/2015 11:51  Ref. Range 11/01/2015 12:26 11/01/2015 17:05 11/01/2015 20:53  Glucose-Capillary Latest Ref Range: 65-99 mg/dL 189 (H) 220 (H) 155 (H)   Results for ACHERON, WALLACH (MRN [id number]) as of 11/02/2015 11:51  Ref. Range 11/01/2015 09:49  Hemoglobin A1C Latest Ref Range: 4.0-6.0 % 12.6 (H)  Glucose Latest Ref Range: 65-99 mg/dL 207 (H)   Review of Glycemic Control  Diabetes history: DM2 Outpatient Diabetes medications: Actos 30 mg daily, Metformin 1000 mg BID, Glipizide XL 10 mg daily Current orders for Inpatient glycemic control: Glipizide 10 mg daily, Actos 30 mg daily, Novolog 0-15 units TID with meals, Novolog 0-5 units QHS  Inpatient Diabetes Program Recommendations: HgbA1C: A1C 12.6% on 11/01/15 indicating an average glucose of 315 mg/dl over the past 2-3 months.  NOTE: Spoke with patient about diabetes and home regimen for diabetes control. Patient reports that he is followed by PCP for diabetes management and currently he takes Actos 30 mg daily, Metformin 1000 mg BID, Glipizide XL 10 mg daily as an outpatient for diabetes control. Patient reports that he is taking DM medications as prescribed. Patient states that he has not checked his glucose in over 6 months and that when he was last checking his glucose it was ranging from 90-low 100's mg/dl. Inquired about prior A1C and patient reports that he does not recall his last A1C value. Discussed A1C results (12.6% on 11/01/15) and explained that his current A1C indicates an average glucose of 315 mg/dl over the past 2-3 months. Discussed glucose and A1C goals. Discussed importance of checking CBGs and maintaining good  CBG control to prevent long-term and short-term complications. Explained how hyperglycemia leads to damage within blood vessels which lead to the common complications seen with uncontrolled diabetes. Stressed to the patient the importance of improving glycemic control to prevent further complications from uncontrolled diabetes. Discussed impact of nutrition, exercise, stress, sickness, and medications on diabetes control. Asked patient if he has ever taken insulin and he states that he took insulin when he first got diagnosed with DM and has not taken insulin in over 12 years. Explained to the patient that he needs to follow up with his doctor about diabetes control and encouraged him to talk with PCP about potentially being referred to an Endocrinologist for assistance with improving diabetes control. Encouraged patient to check his glucose as recommended by the doctor and to keep a log book of glucose readings which he will need to take to doctor appointments. Explained how the doctor he follows up with can use the log book to continue to make insulin adjustments if needed. Patient verbalized understanding of information discussed and he states that he has no further questions at this time related to diabetes.  Thanks, Barnie Alderman, RN, MSN, CDE Diabetes Coordinator Inpatient Diabetes Program [phone] (Team Pager) [phone] ([facility] office) [phone] [facility] office) [phone] [facility] office)

## 2015-11-02 NOTE — Progress Notes (Signed)
Patient discharged home as per MD order, discharge instruction provided, iv removed tele removed prior to discharge home

## 2016-09-06 DIAGNOSIS — I2510 Atherosclerotic heart disease of native coronary artery without angina pectoris: Secondary | ICD-10-CM | POA: Diagnosis present

## 2017-05-13 IMAGING — DX DG CHEST 1V PORT
1 series · 1 of 1 positions shown · non-contrast
Comparison: None.

CLINICAL DATA: Acute onset of left-sided chest pain with exertion,
radiating to the right side of the chest. Initial encounter.

EXAM:
PORTABLE CHEST 1 VIEW

[chest ap]
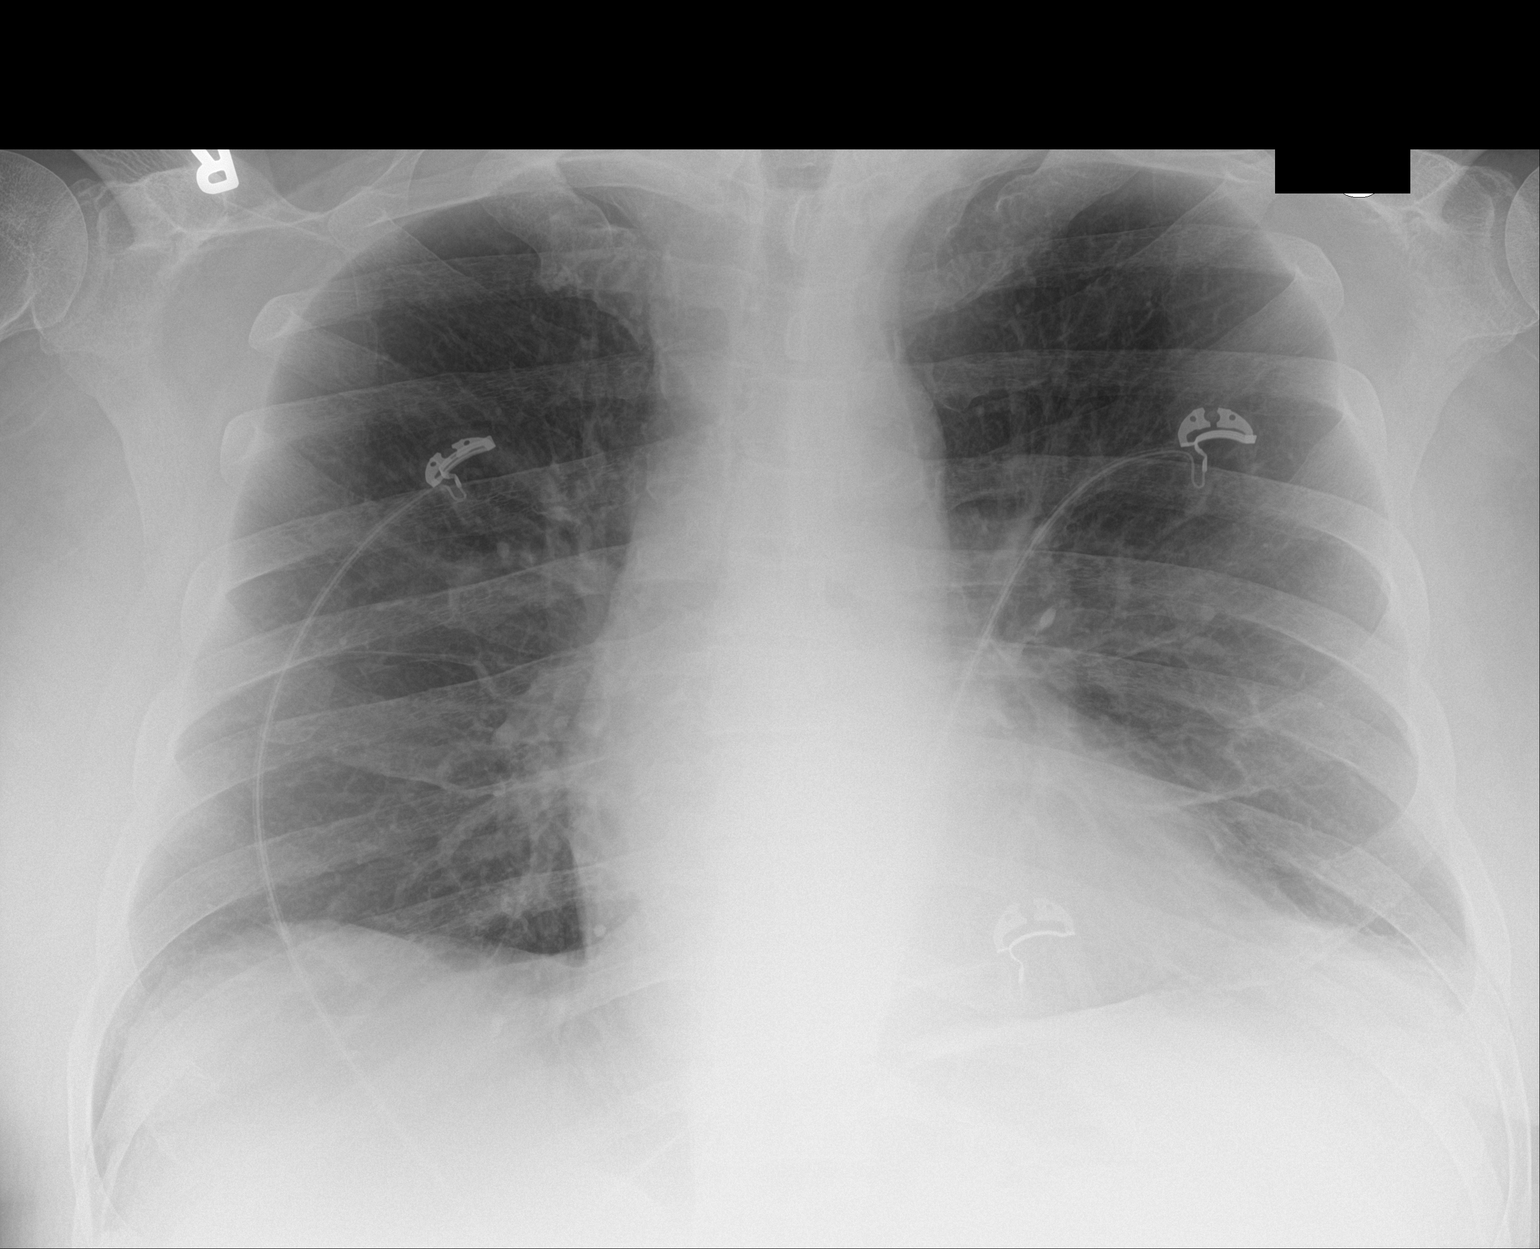

[1 of 1 positions shown; findings below may reference images not displayed]

FINDINGS: The lungs are well-aerated. Pulmonary vascularity is at the upper
limits of normal. Mild left basilar opacity likely reflects
atelectasis. There is no evidence of pleural effusion or
pneumothorax.

The cardiomediastinal silhouette is within normal limits. No acute
osseous abnormalities are seen.
IMPRESSION: Mild left basilar opacity likely reflects atelectasis. Lungs
otherwise clear.

## 2018-04-04 ENCOUNTER — Other Ambulatory Visit: Payer: Self-pay

## 2018-04-04 ENCOUNTER — Inpatient Hospital Stay: Payer: Medicare Other

## 2018-04-04 ENCOUNTER — Observation Stay
Admission: EM | Admit: 2018-04-04 | Discharge: 2018-04-05 | Disposition: A | Discharge status: Home / Self Care | Payer: Medicare Other | Attending: Internal Medicine | Admitting: Internal Medicine

## 2018-04-04 DIAGNOSIS — Z7982 Long term (current) use of aspirin: Secondary | ICD-10-CM | POA: Insufficient documentation

## 2018-04-04 DIAGNOSIS — Z9114 Patient's other noncompliance with medication regimen: Secondary | ICD-10-CM | POA: Diagnosis not present

## 2018-04-04 DIAGNOSIS — E785 Hyperlipidemia, unspecified: Secondary | ICD-10-CM | POA: Diagnosis not present

## 2018-04-04 DIAGNOSIS — Z79890 Hormone replacement therapy (postmenopausal): Secondary | ICD-10-CM | POA: Diagnosis not present

## 2018-04-04 DIAGNOSIS — F17210 Nicotine dependence, cigarettes, uncomplicated: Secondary | ICD-10-CM | POA: Insufficient documentation

## 2018-04-04 DIAGNOSIS — Z23 Encounter for immunization: Secondary | ICD-10-CM | POA: Insufficient documentation

## 2018-04-04 DIAGNOSIS — R7989 Other specified abnormal findings of blood chemistry: Secondary | ICD-10-CM | POA: Insufficient documentation

## 2018-04-04 DIAGNOSIS — R739 Hyperglycemia, unspecified: Secondary | ICD-10-CM | POA: Diagnosis present

## 2018-04-04 DIAGNOSIS — Z79899 Other long term (current) drug therapy: Secondary | ICD-10-CM | POA: Diagnosis not present

## 2018-04-04 DIAGNOSIS — R42 Dizziness and giddiness: Secondary | ICD-10-CM | POA: Diagnosis present

## 2018-04-04 DIAGNOSIS — IMO0002 Reserved for concepts with insufficient information to code with codable children: Secondary | ICD-10-CM | POA: Diagnosis present

## 2018-04-04 DIAGNOSIS — I10 Essential (primary) hypertension: Secondary | ICD-10-CM | POA: Diagnosis not present

## 2018-04-04 DIAGNOSIS — E1365 Other specified diabetes mellitus with hyperglycemia: Secondary | ICD-10-CM | POA: Diagnosis present

## 2018-04-04 DIAGNOSIS — R778 Other specified abnormalities of plasma proteins: Secondary | ICD-10-CM

## 2018-04-04 DIAGNOSIS — E1165 Type 2 diabetes mellitus with hyperglycemia: Principal | ICD-10-CM | POA: Insufficient documentation

## 2018-04-04 DIAGNOSIS — I25119 Atherosclerotic heart disease of native coronary artery with unspecified angina pectoris: Secondary | ICD-10-CM | POA: Diagnosis not present

## 2018-04-04 LAB — CBC
HCT: 48.6 % (ref 39.0–52.0)
HEMOGLOBIN: 17.1 g/dL — AB (ref 13.0–17.0)
MCH: 33.1 pg (ref 26.0–34.0)
MCHC: 35.2 g/dL (ref 30.0–36.0)
MCV: 94.2 fL (ref 80.0–100.0)
Platelets: 266 10*3/uL (ref 150–400)
RBC: 5.16 MIL/uL (ref 4.22–5.81)
RDW: 14.2 % (ref 11.5–15.5)
WBC: 12 10*3/uL — AB (ref 4.0–10.5)
nRBC: 0 % (ref 0.0–0.2)

## 2018-04-04 LAB — BASIC METABOLIC PANEL
Anion gap: 14 (ref 5–15)
BUN: 15 mg/dL (ref 8–23)
CO2: 23 mmol/L (ref 22–32)
CREATININE: 1.27 mg/dL — AB (ref 0.61–1.24)
Calcium: 9.3 mg/dL (ref 8.9–10.3)
Chloride: 92 mmol/L — ABNORMAL LOW (ref 98–111)
GFR calc Af Amer: >60 mL/min (ref >60)
GFR, EST NON AFRICAN AMERICAN: 59 mL/min — AB (ref >60)
Glucose, Bld: 845 mg/dL (ref 70–99)
Potassium: 4.1 mmol/L (ref 3.5–5.1)
SODIUM: 129 mmol/L — AB (ref 135–145)

## 2018-04-04 LAB — GLUCOSE, CAPILLARY
Glucose-Capillary: 282 mg/dL — ABNORMAL HIGH (ref 70–99)
Glucose-Capillary: 496 mg/dL — ABNORMAL HIGH (ref 70–99)
Glucose-Capillary: >600 mg/dL (ref 70–99)

## 2018-04-04 LAB — URINALYSIS, COMPLETE (UACMP) WITH MICROSCOPIC
BILIRUBIN URINE: NEGATIVE
Glucose, UA: >=500 mg/dL — AB
Hgb urine dipstick: NEGATIVE
KETONES UR: NEGATIVE mg/dL
NITRITE: NEGATIVE
PH: 6 (ref 5.0–8.0)
Protein, ur: NEGATIVE mg/dL
SPECIFIC GRAVITY, URINE: 1.027 (ref 1.005–1.030)

## 2018-04-04 LAB — LIPID PANEL
Cholesterol: 290 mg/dL — ABNORMAL HIGH (ref 0–200)
HDL: 38 mg/dL — ABNORMAL LOW (ref >40)
LDL Cholesterol: UNDETERMINED mg/dL (ref 0–99)
Total CHOL/HDL Ratio: 7.6 RATIO
Triglycerides: 587 mg/dL — ABNORMAL HIGH (ref <150)
VLDL: UNDETERMINED mg/dL (ref 0–40)

## 2018-04-04 LAB — HEMOGLOBIN A1C
Hgb A1c MFr Bld: 13 % — ABNORMAL HIGH (ref 4.8–5.6)
Mean Plasma Glucose: 326.4 mg/dL

## 2018-04-04 LAB — TROPONIN I
TROPONIN I: 0.18 ng/mL — AB (ref <0.03)
Troponin I: 0.13 ng/mL (ref <0.03)

## 2018-04-04 LAB — TSH: TSH: 2.679 u[IU]/mL (ref 0.350–4.500)

## 2018-04-04 MED ORDER — INSULIN GLARGINE 100 UNIT/ML ~~LOC~~ SOLN: 20.0000 [IU] | Freq: Two times a day (BID) | SUBCUTANEOUS | Status: DC

## 2018-04-04 MED ORDER — SODIUM CHLORIDE 0.9 % IV SOLN
Freq: Once | INTRAVENOUS | Status: AC
  Administered 2018-04-04: 23:00:00 via INTRAVENOUS

## 2018-04-04 MED ORDER — ASPIRIN EC 81 MG PO TBEC
81.0000 mg | DELAYED_RELEASE_TABLET | Freq: Every day | ORAL | Status: DC
  Administered 2018-04-05: 81 mg via ORAL
  Filled 2018-04-04: qty 1

## 2018-04-04 MED ORDER — ACETAMINOPHEN 650 MG RE SUPP: 650.0000 mg | Freq: Four times a day (QID) | RECTAL | Status: DC | PRN

## 2018-04-04 MED ORDER — ASPIRIN 81 MG PO CHEW
324.0000 mg | CHEWABLE_TABLET | Freq: Once | ORAL | Status: AC
Start: 2018-04-04 — End: 2018-04-04
  Administered 2018-04-04: 324 mg via ORAL
  Filled 2018-04-04: qty 4

## 2018-04-04 MED ORDER — ONDANSETRON HCL 4 MG/2ML IJ SOLN: 4.0000 mg | Freq: Four times a day (QID) | INTRAMUSCULAR | Status: DC | PRN

## 2018-04-04 MED ORDER — ENOXAPARIN SODIUM 40 MG/0.4ML ~~LOC~~ SOLN
40.0000 mg | SUBCUTANEOUS | Status: DC
  Administered 2018-04-04: 40 mg via SUBCUTANEOUS
  Filled 2018-04-04: qty 0.4

## 2018-04-04 MED ORDER — INSULIN REGULAR HUMAN 100 UNIT/ML IJ SOLN
10.0000 [IU] | Freq: Once | INTRAMUSCULAR | Status: DC
  Filled 2018-04-04: qty 10

## 2018-04-04 MED ORDER — INFLUENZA VAC SPLIT HIGH-DOSE 0.5 ML IM SUSY
0.5000 mL | PREFILLED_SYRINGE | INTRAMUSCULAR | Status: AC
  Administered 2018-04-05: 0.5 mL via INTRAMUSCULAR
  Filled 2018-04-04: qty 0.5

## 2018-04-04 MED ORDER — ACETAMINOPHEN 325 MG PO TABS: 650.0000 mg | ORAL_TABLET | Freq: Four times a day (QID) | ORAL | Status: DC | PRN

## 2018-04-04 MED ORDER — INSULIN ASPART 100 UNIT/ML ~~LOC~~ SOLN
10.0000 [IU] | Freq: Once | SUBCUTANEOUS | Status: AC
Start: 2018-04-04 — End: 2018-04-04
  Administered 2018-04-04: 10 [IU] via SUBCUTANEOUS
  Filled 2018-04-04: qty 1

## 2018-04-04 MED ORDER — SODIUM CHLORIDE 0.9 % IV BOLUS
1000.0000 mL | Freq: Once | INTRAVENOUS | Status: AC
  Administered 2018-04-04: 1000 mL via INTRAVENOUS

## 2018-04-04 MED ORDER — INSULIN REGULAR HUMAN 100 UNIT/ML IJ SOLN
20.0000 [IU] | Freq: Once | INTRAMUSCULAR | Status: AC
  Administered 2018-04-04: 20 [IU] via INTRAVENOUS
  Filled 2018-04-04: qty 10

## 2018-04-04 MED ORDER — INSULIN GLARGINE 100 UNIT/ML ~~LOC~~ SOLN
20.0000 [IU] | Freq: Every day | SUBCUTANEOUS | Status: DC
  Filled 2018-04-04: qty 0.2

## 2018-04-04 MED ORDER — ONDANSETRON HCL 4 MG PO TABS: 4.0000 mg | ORAL_TABLET | Freq: Four times a day (QID) | ORAL | Status: DC | PRN

## 2018-04-04 MED ORDER — ATORVASTATIN CALCIUM 20 MG PO TABS: 40.0000 mg | ORAL_TABLET | Freq: Every day | ORAL | Status: DC

## 2018-04-04 MED ORDER — POLYETHYLENE GLYCOL 3350 17 G PO PACK: 17.0000 g | PACK | Freq: Every day | ORAL | Status: DC | PRN

## 2018-04-04 MED ORDER — METOPROLOL TARTRATE 25 MG PO TABS
25.0000 mg | ORAL_TABLET | Freq: Two times a day (BID) | ORAL | Status: DC
  Administered 2018-04-05: 25 mg via ORAL
  Filled 2018-04-04: qty 1

## 2018-04-04 MED ORDER — INSULIN ASPART 100 UNIT/ML ~~LOC~~ SOLN
0.0000 [IU] | Freq: Three times a day (TID) | SUBCUTANEOUS | Status: DC
  Administered 2018-04-05: 20 [IU] via SUBCUTANEOUS
  Administered 2018-04-05: 7 [IU] via SUBCUTANEOUS
  Filled 2018-04-04 (×2): qty 1

## 2018-04-04 NOTE — ED Notes (Signed)
Report was called and given at this time.

## 2018-04-04 NOTE — ED Notes (Signed)
Blood glucose rechecked. Still reads HIGH. 58ml of fluid remain in 2nd liter bag.

## 2018-04-04 NOTE — ED Notes (Signed)
Admitting md to bedside 

## 2018-04-04 NOTE — H&P (Signed)
[place] at [place] NAME: Alexander Tran    MR#:  [id number]  DATE OF BIRTH:  22-Dec-1950  DATE OF ADMISSION:  04/04/2018  PRIMARY CARE PHYSICIAN: Cherrie Distance, MD   REQUESTING/REFERRING PHYSICIAN: Dr. Burlene Arnt  CHIEF COMPLAINT:  dizzy and was almost going to pass out at work  HISTORY OF PRESENT ILLNESS:  Alexander Tran  is a 67 y.o. male with a known history of diabetes, hyperlipidemia, hypertension, coronary artery disease comes to the emergency room after he felt he was going to pass out at work. Patient states he has been drinking a lot of water for last three weeks. He is really urinating excessive. Has ran out of his diabetes and blood pressure meds for last three months due to his losing his insurance coverage  in the ER patient was found to have blood sugar of 845 he was dehydrated with elevated creatinine and low sodium 129  Patient denies any chest pain. Troponin was 0.18 shows normal sinus rhythm and right bundle branch block  patient is being admitted with uncontrolled diabetes, dehydration and elevated troponin  no family in the room  PAST MEDICAL HISTORY:   Past Medical History:  Diagnosis Date  . Diabetes mellitus without complication ([place])   . Hyperlipidemia   . Hypertension   . Manic depression ([place])   . 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[place])     PAST SURGICAL HISTOIRY:   Past Surgical History:  Procedure Laterality Date  . BACK SURGERY    . CARDIAC CATHETERIZATION Left 11/02/2015   Procedure: Left Heart Cath and Coronary Angiography;  Surgeon: Isaias Cowman, MD;  Location: [facility] [facility];  Service: Cardiovascular;  Laterality: Left;  . SURGERY SCROTAL / TESTICULAR      SOCIAL HISTORY:   Social History   Tobacco Use  . Smoking status: Current Every Day Smoker    Packs/day: 1.00    Types: Cigarettes  Substance Use Topics  . Alcohol use: Yes    Alcohol/week: 0.0 standard drinks    Comment: one  glass of wine daily    FAMILY HISTORY:   Family History  Problem Relation Age of Onset  . Coronary artery disease Father     DRUG ALLERGIES:   Allergies  Allergen Reactions  . Other Other (See Comments)    Uncoded Allergy. Allergen: bandaid adhesive, Other Reaction: Contac Dermatitis    REVIEW OF SYSTEMS:  Review of Systems  Constitutional: Negative for chills, fever and weight loss.  HENT: Negative for ear discharge, ear pain and nosebleeds.   Eyes: Negative for blurred vision, pain and discharge.  Respiratory: Negative for sputum production, shortness of breath, wheezing and stridor.   Cardiovascular: Negative for chest pain, palpitations, orthopnea and PND.  Gastrointestinal: Negative for abdominal pain, diarrhea, nausea and vomiting.  Genitourinary: Positive for dysuria and frequency. Negative for urgency.  Musculoskeletal: Negative for back pain.  Neurological: Positive for dizziness. Negative for sensory change, speech change, focal weakness and weakness.  Psychiatric/Behavioral: Negative for depression and hallucinations. The patient is not nervous/anxious.      MEDICATIONS AT HOME:   Prior to Admission medications   Medication Sig Start Date End Date Taking? Authorizing Provider  aspirin 325 MG tablet Take 325 mg by mouth once.    [provider]  glipiZIDE (GLUCOTROL XL) 10 MG 24 hr tablet Take 10 mg by mouth daily. 09/03/14   [provider]  isosorbide mononitrate (IMDUR) 30 MG 24 hr tablet Take 1 tablet (  30 mg total) by mouth daily. 11/02/15   Fritzi Mandes, MD  lamoTRIgine (LAMICTAL) 200 MG tablet Take 1 tablet by mouth daily. 01/12/14   [provider]  levothyroxine (SYNTHROID, LEVOTHROID) 100 MCG tablet Take 100 mcg by mouth every morning. 12/16/14   [provider]  lisinopril (PRINIVIL,ZESTRIL) 20 MG tablet Take 20 mg by mouth daily. 03/24/15   [provider]  metFORMIN (GLUCOPHAGE) 1000 MG tablet Take 1,000 mg by  mouth 2 (two) times daily with a meal.  09/03/14   [provider]  metoprolol succinate (TOPROL-XL) 25 MG 24 hr tablet Take 1 tablet (25 mg total) by mouth daily. 11/02/15   Fritzi Mandes, MD  nitroGLYCERIN (NITROSTAT) 0.4 MG SL tablet Place 1 tablet (0.4 mg total) under the tongue every 5 (five) minutes as needed for chest pain. 10/29/15   Dustin Flock, MD  pioglitazone (ACTOS) 30 MG tablet Take 30 mg by mouth daily. 12/16/14   [provider]  sertraline (ZOLOFT) 50 MG tablet Take 50 mg by mouth daily. 01/12/14   [provider]  simvastatin (ZOCOR) 40 MG tablet Take 40 mg by mouth at bedtime. 12/16/14   [provider]      VITAL SIGNS:  Blood pressure 126/81, pulse 72, temperature 97.8 F (36.6 C), temperature source Oral, resp. rate 18, height 6\' 5"  (1.956 m), weight 104.3 kg, SpO2 100 %.  PHYSICAL EXAMINATION:  GENERAL:  67 y.o.-year-old patient lying in the bed with no acute distress.  EYES: Pupils equal, round, reactive to light and accommodation. No scleral icterus. Extraocular muscles intact.  HEENT: Head atraumatic, normocephalic. Oropharynx and nasopharynx clear.  NECK:  Supple, no jugular venous distention. No thyroid enlargement, no tenderness.  LUNGS: Normal breath sounds bilaterally, no wheezing, rales,rhonchi or crepitation. No use of accessory muscles of respiration.  CARDIOVASCULAR: S1, S2 normal. No murmurs, rubs, or gallops.  ABDOMEN: Soft, nontender, nondistended. Bowel sounds present. No organomegaly or mass.  EXTREMITIES: No pedal edema, cyanosis, or clubbing.  NEUROLOGIC: Cranial nerves II through XII are intact. Muscle strength 5/5 in all extremities. Sensation intact. Gait not checked.  PSYCHIATRIC: The patient is alert and oriented x 3.  SKIN: No obvious rash, lesion, or ulcer.   LABORATORY PANEL:   CBC Recent Labs  Lab 04/04/18 1435  WBC 12.0*  HGB 17.1*  HCT 48.6  PLT 266    ------------------------------------------------------------------------------------------------------------------  Chemistries  Recent Labs  Lab 04/04/18 1435  NA 129*  K 4.1  CL 92*  CO2 23  GLUCOSE 845*  BUN 15  CREATININE 1.27*  CALCIUM 9.3   ------------------------------------------------------------------------------------------------------------------  Cardiac Enzymes Recent Labs  Lab 04/04/18 1435  TROPONINI 0.18*   ------------------------------------------------------------------------------------------------------------------  RADIOLOGY:  No results found.  EKG:    IMPRESSION AND PLAN:   Joshoa Shawler  is a 67 y.o. male with a known history of diabetes, hyperlipidemia, hypertension, coronary artery disease comes to the emergency room after he felt he was going to pass out at work. Patient states he has been drinking a lot of water for last three weeks. He is really urinating excessive. Has ran out of his diabetes and blood pressure meds for last three months due to his losing his insurance coverage  1. uncontrolled diabetes-- due to noncompliance with medication secondary to losing insurance coverage -patient's last A1c in June 2017 was 12.6 -IV fluids -Lantus 20 units bedtime and in a just dose according to the sugars with sliding scale -patient received 10 units of Lantus in the ER -  diabetes coordinator education -patient used to be on metformin, glipizide and Actos before -anion gap normal  2. elevated troponin without chest pain or EKG changes = aspirin, beta blockers, statins -cycle troponin times three -cardiology consultation placed with Dr. Clayborn Bigness sent message via secure chat  3. Hypertension -started metoprolol  4. History of hyperlipidemia -started Lipitor  5. DVT prophylaxis subcu Lovenox   All the records are reviewed and case discussed with ED provider. Management plans discussed with the patient, family and they are in  agreement.  CODE STATUS: full  TOTAL TIME TAKING CARE OF THIS PATIENT: *50* minutes.    Fritzi Mandes M.D on 04/04/2018 at 6:35 PM  Between 7am to 6pm - Pager - [phone]  After 6pm go to [email or web address] - password [email or web address] [facility]  [facility]  Office  [phone]  CC: Primary care physician; Cherrie Distance, MD

## 2018-04-04 NOTE — Progress Notes (Signed)
Family Meeting Note  Today's meeting took place in the emergency room. No family present. Comes in with excessive thirst, frequency of urination and feeling dizzy. Was found to have uncontrolled diabetes. Elevated troponin of .18. He is ran out of his medicines three months ago.  Code status address with patient he wants to be a full code.  Total Time spent for discussion about 16 minutes Fritzi Mandes, MD

## 2018-04-04 NOTE — ED Notes (Signed)
IVF infusing. Pt has not urinated yet. Sitting in recliner. No distress noted.

## 2018-04-04 NOTE — Progress Notes (Signed)
CBG Meter reading HIGH. MD Jannifer Franklin notified. MD to place orders. Will continue to monitor.

## 2018-04-04 NOTE — ED Provider Notes (Signed)
[facility] Emergency Department Provider Note  ____________________________________________   I have reviewed the triage vital signs and the nursing notes. Where available I have reviewed prior notes and, if possible and indicated, outside hospital notes.    HISTORY  Chief Complaint Hyperglycemia    HPI Alexander Tran is a 67 y.o. male with a history of myocardial infarction's poor compliance, and hypertension states he is taking none of his meds for the last 3 months because of insurance problems.  He has had urinary frequency which is getting worse over the last couple weeks.  He felt lightheaded yesterday.  Denies chest pain or shortness of breath nausea or vomiting.  States that he now has insurance and wants to see about doing something about his sugars.  Denies dysuria but does have urinary frequency.  He states he did have an episode of lightheadedness yesterday after drinking root beer, with full sugar, x2, and having copious urination thereafter.    Past Medical History:  Diagnosis Date  . Diabetes mellitus without complication ([place])   . Hyperlipidemia   . Hypertension   . Manic depression ([place])   . 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[facility])     Patient Active Problem List   Diagnosis Date Noted  . Chest pain 10/28/2015    Past Surgical History:  Procedure Laterality Date  . BACK SURGERY    . CARDIAC CATHETERIZATION Left 11/02/2015   Procedure: Left Heart Cath and Coronary Angiography;  Surgeon: Isaias Cowman, MD;  Location: [facility];  Service: Cardiovascular;  Laterality: Left;  . SURGERY SCROTAL / TESTICULAR      Prior to Admission medications   Medication Sig Start Date End Date Taking? Authorizing Provider  aspirin 325 MG tablet Take 325 mg by mouth once.    [provider]  glipiZIDE (GLUCOTROL XL) 10 MG 24 hr tablet Take 10 mg by mouth daily. 09/03/14   [provider]  isosorbide mononitrate (IMDUR) 30 MG 24 hr tablet  Take 1 tablet (30 mg total) by mouth daily. 11/02/15   Fritzi Mandes, MD  lamoTRIgine (LAMICTAL) 200 MG tablet Take 1 tablet by mouth daily. 01/12/14   [provider]  levothyroxine (SYNTHROID, LEVOTHROID) 100 MCG tablet Take 100 mcg by mouth every morning. 12/16/14   [provider]  lisinopril (PRINIVIL,ZESTRIL) 20 MG tablet Take 20 mg by mouth daily. 03/24/15   [provider]  metFORMIN (GLUCOPHAGE) 1000 MG tablet Take 1,000 mg by mouth 2 (two) times daily with a meal.  09/03/14   [provider]  metoprolol succinate (TOPROL-XL) 25 MG 24 hr tablet Take 1 tablet (25 mg total) by mouth daily. 11/02/15   Fritzi Mandes, MD  nitroGLYCERIN (NITROSTAT) 0.4 MG SL tablet Place 1 tablet (0.4 mg total) under the tongue every 5 (five) minutes as needed for chest pain. 10/29/15   Dustin Flock, MD  pioglitazone (ACTOS) 30 MG tablet Take 30 mg by mouth daily. 12/16/14   [provider]  sertraline (ZOLOFT) 50 MG tablet Take 50 mg by mouth daily. 01/12/14   [provider]  simvastatin (ZOCOR) 40 MG tablet Take 40 mg by mouth at bedtime. 12/16/14   [provider]    Allergies Other  Family History  Problem Relation Age of Onset  . Coronary artery disease Father     Social History Social History   Tobacco Use  . Smoking status: Current Every Day Smoker    Packs/day: 1.00    Types: Cigarettes  Substance Use Topics  .  Alcohol use: Yes    Alcohol/week: 0.0 standard drinks    Comment: one glass of wine daily  . Drug use: No    Review of Systems Constitutional: No fever/chills Eyes: No visual changes. ENT: No sore throat. No stiff neck no neck pain Cardiovascular: Denies chest pain. Respiratory: Denies shortness of breath. Gastrointestinal:   no vomiting.  No diarrhea.  No constipation. Genitourinary: Negative for dysuria. Musculoskeletal: Negative lower extremity swelling Skin: Negative for rash. Neurological: Negative for severe  headaches, focal weakness or numbness.   ____________________________________________   PHYSICAL EXAM:  VITAL SIGNS: ED Triage Vitals  Enc Vitals Group     BP 04/04/18 1433 (!) 185/159     Pulse Rate 04/04/18 1433 (!) 105     Resp 04/04/18 1433 18     Temp 04/04/18 1433 97.8 F (36.6 C)     Temp Source 04/04/18 1433 Oral     SpO2 04/04/18 1433 100 %     Weight 04/04/18 1434 230 lb (104.3 kg)     Height 04/04/18 1434 6\' 5"  (1.956 m)     Head Circumference --      Peak Flow --      Pain Score 04/04/18 1434 0     Pain Loc --      Pain Edu? --      Excl. in [place]? --     Constitutional: Alert and oriented. Well appearing and in no acute distress. Eyes: Conjunctivae are normal Head: Atraumatic HEENT: No congestion/rhinnorhea. Mucous membranes are moist.  Oropharynx non-erythematous Neck:   Nontender with no meningismus, no masses, no stridor Cardiovascular: Normal rate, regular rhythm. Grossly normal heart sounds.  Good peripheral circulation. Respiratory: Normal respiratory effort.  No retractions. Lungs CTAB. Abdominal: Soft and nontender. No distention. No guarding no rebound Back:  There is no focal tenderness or step off.  there is no midline tenderness there are no lesions noted. there is no CVA tenderness Musculoskeletal: No lower extremity tenderness, no upper extremity tenderness. No joint effusions, no DVT signs strong distal pulses no edema Neurologic:  Normal speech and language. No gross focal neurologic deficits are appreciated.  Skin:  Skin is warm, dry and intact. No rash noted. Psychiatric: Mood and affect are normal. Speech and behavior are normal.  ____________________________________________   LABS (all labs ordered are listed, but only abnormal results are displayed)  Labs Reviewed  BASIC METABOLIC PANEL - Abnormal; Notable for the following components:      Result Value   Sodium 129 (*)    Chloride 92 (*)    Glucose, Bld 845 (*)    Creatinine, Ser  1.27 (*)    GFR calc non Af Amer 59 (*)    All other components within normal limits  CBC - Abnormal; Notable for the following components:   WBC 12.0 (*)    Hemoglobin 17.1 (*)    All other components within normal limits  URINALYSIS, COMPLETE (UACMP) WITH MICROSCOPIC - Abnormal; Notable for the following components:   Color, Urine STRAW (*)    APPearance HAZY (*)    Glucose, UA >=500 (*)    Leukocytes, UA SMALL (*)    WBC, UA >50 (*)    Bacteria, UA RARE (*)    All other components within normal limits  GLUCOSE, CAPILLARY - Abnormal; Notable for the following components:   Glucose-Capillary >600 (*)    All other components within normal limits  TROPONIN I - Abnormal; Notable for the following components:   Troponin  I 0.18 (*)    All other components within normal limits  URINE CULTURE  CBG MONITORING, ED  CBG MONITORING, ED    Pertinent labs  results that were available during my care of the patient were reviewed by me and considered in my medical decision making (see chart for details). ____________________________________________  EKG  I personally interpreted any EKGs ordered by me or triage Sinus rhythm, no acute ST changes noted suggestive of acute ischemia.  Old right bundle branch block appreciated ____________________________________________  RADIOLOGY  Pertinent labs & imaging results that were available during my care of the patient were reviewed by me and considered in my medical decision making (see chart for details). If possible, patient and/or family made aware of any abnormal findings.  No results found. ____________________________________________    PROCEDURES  Procedure(s) performed: None  Procedures  Critical Care performed: CRITICAL CARE Performed by: Schuyler Amor   Total critical care time: 40 minutes  Critical care time was exclusive of separately billable procedures and treating other patients.  Critical care was necessary to treat  or prevent imminent or life-threatening deterioration.  Critical care was time spent personally by me on the following activities: development of treatment plan with patient and/or surrogate as well as nursing, discussions with consultants, evaluation of patient's response to treatment, examination of patient, obtaining history from patient or surrogate, ordering and performing treatments and interventions, ordering and review of laboratory studies, ordering and review of radiographic studies, pulse oximetry and re-evaluation of patient's condition.   ____________________________________________   INITIAL IMPRESSION / ASSESSMENT AND PLAN / ED COURSE  Pertinent labs & imaging results that were available during my care of the patient were reviewed by me and considered in my medical decision making (see chart for details).  Patient here with sugar are very very elevated, I does appear mildly dehydrated but he is actually in no acute distress.  His cardiac enzymes, sent because of his lightheadedness and history of MI, and my suspicion that he probably would not feel a heart attack if he had one given his uncontrolled sugars, came back somewhat elevated.  We will give him aspirin but he remains asymptomatic here.  Cannot see any acute EKG changes.  We are giving him insulin and IV fluids.  His anion gap is not significantly elevated this is most likely chronically elevated blood glucose.  Dehydration could certainly cause him a reactive elevation of troponin.  Nonetheless, we will admit for further observation I will give him aspirin, initiated insulin therapy, his blood pressures initially reading quite high, but that I believe is a fictitious number as he was lying on the [place] rechecked it was more reassuring.  We are giving him IV fluids as he certainly is quite depleted even though he looks well, and we will admit him.  I did try to counsel him somewhat about his lifestyle choices     ____________________________________________   FINAL CLINICAL IMPRESSION(S) / ED DIAGNOSES  Final diagnoses:  None      This chart was dictated using voice recognition software.  Despite best efforts to proofread,  errors can occur which can change meaning.      Schuyler Amor, MD 04/04/18 [phone]

## 2018-04-04 NOTE — ED Triage Notes (Addendum)
Pt is type 2 DM, does NOT take insulin. States he hasn't taken any pills for DM in 3 months. Hasn't checked CBG in a while. States increase in thirst, urination. States he had an appt with PCP today, drove to office and it was closed. Keeps asking for water.

## 2018-04-05 ENCOUNTER — Inpatient Hospital Stay
Admit: 2018-04-05 | Discharge: 2018-04-05 | Disposition: A | Discharge status: Home / Self Care | Payer: Medicare Other | Attending: Internal Medicine | Admitting: Internal Medicine

## 2018-04-05 DIAGNOSIS — R739 Hyperglycemia, unspecified: Secondary | ICD-10-CM | POA: Diagnosis present

## 2018-04-05 LAB — GLUCOSE, CAPILLARY
GLUCOSE-CAPILLARY: 367 mg/dL — AB (ref 70–99)
Glucose-Capillary: 168 mg/dL — ABNORMAL HIGH (ref 70–99)
Glucose-Capillary: 295 mg/dL — ABNORMAL HIGH (ref 70–99)

## 2018-04-05 LAB — BASIC METABOLIC PANEL
Anion gap: 6 (ref 5–15)
BUN: 14 mg/dL (ref 8–23)
CO2: 27 mmol/L (ref 22–32)
Calcium: 8.5 mg/dL — ABNORMAL LOW (ref 8.9–10.3)
Chloride: 104 mmol/L (ref 98–111)
Creatinine, Ser: 0.83 mg/dL (ref 0.61–1.24)
GFR calc Af Amer: >60 mL/min (ref >60)
GFR calc non Af Amer: >60 mL/min (ref >60)
Glucose, Bld: 227 mg/dL — ABNORMAL HIGH (ref 70–99)
POTASSIUM: 3.7 mmol/L (ref 3.5–5.1)
Sodium: 137 mmol/L (ref 135–145)

## 2018-04-05 LAB — ECHOCARDIOGRAM COMPLETE
Height: 77 in
WEIGHTICAEL: 4064 [oz_av]

## 2018-04-05 LAB — TROPONIN I
TROPONIN I: 0.1 ng/mL — AB (ref <0.03)
Troponin I: 0.14 ng/mL (ref <0.03)

## 2018-04-05 MED ORDER — BLOOD GLUCOSE MONITOR KIT: PACK | 1 refills | Status: DC

## 2018-04-05 MED ORDER — METOPROLOL TARTRATE 25 MG PO TABS: 25.0000 mg | ORAL_TABLET | Freq: Two times a day (BID) | ORAL | 3 refills | Status: DC

## 2018-04-05 MED ORDER — METFORMIN HCL 1000 MG PO TABS: 1000.0000 mg | ORAL_TABLET | Freq: Two times a day (BID) | ORAL | 3 refills | Status: DC

## 2018-04-05 MED ORDER — LEVOTHYROXINE SODIUM 100 MCG PO TABS: 100.0000 ug | ORAL_TABLET | Freq: Every morning | ORAL | 3 refills | Status: DC

## 2018-04-05 MED ORDER — METOPROLOL TARTRATE 25 MG PO TABS: 25.0000 mg | ORAL_TABLET | Freq: Two times a day (BID) | ORAL | 3 refills | Status: AC

## 2018-04-05 MED ORDER — INSULIN GLARGINE 100 UNIT/ML SOLOSTAR PEN: 22.0000 [IU] | PEN_INJECTOR | Freq: Every day | SUBCUTANEOUS | 11 refills | Status: DC

## 2018-04-05 MED ORDER — METFORMIN HCL 500 MG PO TABS: 1000.0000 mg | ORAL_TABLET | Freq: Two times a day (BID) | ORAL | Status: DC

## 2018-04-05 MED ORDER — INSULIN STARTER KIT- PEN NEEDLES (ENGLISH): 1.0000 | Freq: Once | 0 refills | Status: DC

## 2018-04-05 MED ORDER — LISINOPRIL 10 MG PO TABS
10.0000 mg | ORAL_TABLET | Freq: Every day | ORAL | Status: DC
  Administered 2018-04-05: 10 mg via ORAL
  Filled 2018-04-05: qty 1

## 2018-04-05 MED ORDER — ASPIRIN 81 MG PO TBEC: 81.0000 mg | DELAYED_RELEASE_TABLET | Freq: Every day | ORAL | 3 refills | Status: AC

## 2018-04-05 MED ORDER — METFORMIN HCL 1000 MG PO TABS: 1000.0000 mg | ORAL_TABLET | Freq: Two times a day (BID) | ORAL | 3 refills | Status: AC

## 2018-04-05 MED ORDER — INSULIN STARTER KIT- PEN NEEDLES (ENGLISH): 1.0000 | Freq: Once | 0 refills | Status: AC

## 2018-04-05 MED ORDER — ASPIRIN 81 MG PO TBEC: 81.0000 mg | DELAYED_RELEASE_TABLET | Freq: Every day | ORAL | 3 refills | Status: DC

## 2018-04-05 MED ORDER — INSULIN STARTER KIT- PEN NEEDLES (ENGLISH)
1.0000 | Freq: Once | Status: AC
  Administered 2018-04-05: 1
  Filled 2018-04-05: qty 1

## 2018-04-05 MED ORDER — INSULIN REGULAR HUMAN 100 UNIT/ML IJ SOLN
10.0000 [IU] | Freq: Once | INTRAMUSCULAR | Status: DC
  Filled 2018-04-05: qty 10

## 2018-04-05 MED ORDER — INSULIN GLARGINE 100 UNIT/ML ~~LOC~~ SOLN
23.0000 [IU] | Freq: Every day | SUBCUTANEOUS | Status: DC
  Administered 2018-04-05: 23 [IU] via SUBCUTANEOUS
  Filled 2018-04-05 (×2): qty 0.23

## 2018-04-05 MED ORDER — ATORVASTATIN CALCIUM 40 MG PO TABS: 40.0000 mg | ORAL_TABLET | Freq: Every day | ORAL | 3 refills | Status: DC

## 2018-04-05 MED ORDER — ATORVASTATIN CALCIUM 40 MG PO TABS: 40.0000 mg | ORAL_TABLET | Freq: Every day | ORAL | 3 refills | Status: AC

## 2018-04-05 MED ORDER — LISINOPRIL 10 MG PO TABS: 10.0000 mg | ORAL_TABLET | Freq: Every day | ORAL | 3 refills | Status: DC

## 2018-04-05 MED ORDER — LIVING WELL WITH DIABETES BOOK
Freq: Once | Status: AC
  Administered 2018-04-05: 09:00:00
  Filled 2018-04-05: qty 1

## 2018-04-05 MED ORDER — INSULIN PEN NEEDLE 31G X 4 MM MISC: 1.0000 "pen " | Freq: Every day | 3 refills | Status: DC

## 2018-04-05 NOTE — Progress Notes (Addendum)
Inpatient Diabetes Program Recommendations  AACE/ADA: New Consensus Statement on Inpatient Glycemic Control (2019)  Target Ranges:  Prepandial:   less than 140 mg/dL      Peak postprandial:   less than 180 mg/dL (1-2 hours)      Critically ill patients:  140 - 180 mg/dL   Results for Alexander Tran, Alexander Tran (MRN [id number]) as of 04/05/2018 07:21  Ref. Range 04/04/2018 14:37 04/04/2018 19:02 04/04/2018 22:38 04/04/2018 23:47 04/05/2018 01:17  Glucose-Capillary Latest Ref Range: 70 - 99 mg/dL >600 (HH) >600 (HH) 496 (H) 282 (H) 168 (H)  Results for Alexander Tran, Alexander Tran (MRN [id number]) as of 04/05/2018 07:21  Ref. Range 04/04/2018 14:35  Glucose Latest Ref Range: 70 - 99 mg/dL [facility])   Results for Alexander Tran, Alexander Tran (MRN [id number]) as of 04/05/2018 07:21  Ref. Range 11/01/2015 09:49 04/04/2018 21:28  Hemoglobin A1C Latest Ref Range: 4.8 - 5.6 % 12.6 (H) 13.0 (H)   Review of Glycemic Control  Diabetes history: DM2 Outpatient Diabetes medications: Actos 30 mg daily, Glipizide XL 10 mg daily, Metformin 1000 mg BID Current orders for Inpatient glycemic control: Lantus 20 units QHS, Novolog 0-20 units TID with meals  Inpatient Diabetes Program Recommendations:  Insulin Basal: Please consider changing frequency of Lantus to 20 units daily (to start now) since no basal insulin has been given since arriving at the hospital.  Correction (SSI): Please consider ordering Novolog 0-5 units QHS.  HgbA1C: A1C 13% on 04/04/18 indicating an average glucose of 326 mg/dl over the past 2-3 months.  Outpatient DM medicationsl: Recommend patient be discharged on Lantus (prefers insulin pens), Metformin, and at least one of the other oral DM meds he was already taking outpatient. Patient has an appointment already scheduled with PCP on 04/11/18.  NOTE: Noted consult for Diabetes Coordinator. Diabetes Coordinator is not on campus over the weekend but available by pager from 8am to 5pm for questions or concerns. Chart reviewed. Per  H&P, patient ran out of DM medications about 3 months ago due to loss of insurance. Per Care Everywhere last visit with PCP was on 09/06/16 but noted patient has an appointment scheduled for 04/11/18 with PCP. Initial glucose 845 mg/dl on labs and A1C 13% on 04/04/18.  Diabetes Coordinator spoke with patient during hospital admission in June 2017 and patient reported being on insulin in the past when he was first dx with DM. Per chart, patient has [employer]. Anticipate patient will need insulin as an outpatient for DM control. Ordered: Living Well with DM book, insulin starter kit, patient education by bedside RNs, and RD consult.   Addendum 04/05/18@8 :30: Spoke with patient over the phone about diabetes and home regimen for diabetes control. Patient reports that he is followed by PCP for diabetes management and he confirms that he ran out of DM medications 3 months ago due to loss of insurance. Patient states that he recently got Medicare on 03/07/18. Patient confirms he was taking Actos, Metformin, and Glipizide as noted above. Patient states that he use to check his glucose once a day fasting and it was usually 90-150 mg/dl.  Patient confirms that he has an appointment scheduled with PCP on 04/11/18.   Inquired about prior A1C and patient reports that he does not recall his last A1C value. Discussed A1C results (13% on 04/04/18) and explained that his current A1C indicates an average glucose of 326 mg/dl over the past 2-3 months. Discussed glucose and A1C goals. Discussed importance of checking CBGs and maintaining good CBG control  to prevent long-term and short-term complications. Explained how hyperglycemia leads to damage within blood vessels which lead to the common complications seen with uncontrolled diabetes. Stressed to the patient the importance of improving glycemic control to prevent further complications from uncontrolled diabetes. Patient reports that he has been having  excessive urination and thirst for several weeks (having to urinate 10-15 times per night). 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  Discussed impact of nutrition, exercise, stress, sickness, and medications on diabetes control. Patient states that he has lost about 100 pounds (intentially trying to lose weight) and notes that he only eats once a day. Patient reports that he has been drinking regular soda lately but he plans to only drink diet soda if he drinks soda. Discussed importance of eating meals consistently to help improve DM control.  Informed patient that RD has been consulted for further diet education.  Patient states he will need a new glucometer and testing supplies (will ask MD to provide Rx).  Informed patient about Reli-On Prime glucometer at [facility] for $9 and box of 50 test strips for $9 if his copay is high with current insurance. Encouraged patient to check his glucose 3-4 times per day (before meals and at bedtime) and to keep a log book of glucose readings.  Patient has Living Well with DM book. Encouraged patient to read entire book to increase knowledge and understanding of importance of DM control. Patient states that he has used insulin vial/sringe and insulin pens in the past and he is open to using insulin again if prescribed. Patient states that he would prefer to use insulin pens if insulin prescribed at discharge. Informed patient that he will likely need to take insulin as an outpatient. Informed patient an insulin starter kit has been ordered; asked that he review the information. Will ask RNs to review insulin administration with patient and allow patient to self inject insulin. Encouraged patient to take DM medications as MD prescribes, check glucose 3-4 times per day, and to keep a written log of glucose readings.   Patient verbalized understanding of information discussed and he states that he has no further  questions at this time related to diabetes.  Thanks, Barnie Alderman, RN, MSN, CDE Diabetes Coordinator Inpatient Diabetes Program [phone] (Team Pager from 8am to 5pm)

## 2018-04-05 NOTE — Discharge Summary (Signed)
[facility] [place] NAME: Alexander Weninger    MR#:  [id number]  DATE OF BIRTH:  14-Jan-1951  DATE OF ADMISSION:  04/04/2018   ADMITTING PHYSICIAN: Fritzi Mandes, MD  DATE OF DISCHARGE:  04/05/18  PRIMARY CARE PHYSICIAN: Cherrie Distance, MD   ADMISSION DIAGNOSIS:   medication request   DISCHARGE DIAGNOSIS:   Active Problems:   DM (diabetes mellitus), secondary uncontrolled ([place])   Hyperglycemia   SECONDARY DIAGNOSIS:   Past Medical History:  Diagnosis Date  . Diabetes mellitus without complication ([place])   . Hyperlipidemia   . Hypertension   . Manic depression ([place])   . 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[facility])     HOSPITAL COURSE:   67 year old male with past medical history significant for CAD status post medical management, type 2 diabetes mellitus, hypertension not taking any medications at home brought in secondary to feeling bad and polyuria and polydipsia  1.  Uncontrolled diabetes mellitus-not taking his oral medications for a few months now due to lack of insurance. -Sugars were elevated and greater than 800 on admission. -received IV fluids and received  insulin . -  Started on Lantus. -Appreciate diabetes coordinator input -A1c is 13.  Started on Lantus FlexPen at discharge and also metformin. -Has a follow-up appointment with PCP next week. -Will be discharged with a glucometer  2.  CAD-status post cardiac cath in 2017 showing two-vessel disease with good collaterals and so was discharged on medical management -Patient not taking any medications at home. -Restarted aspirin, statin, metoprolol and lisinopril  3.  Hyperlipidemia-restarted statin  4.  Hypertension-on metoprolol and lisinopril  5.  Elevated troponins-likely demand ischemia.  Patient denies any chest pain.  Troponins are plateaued and echocardiogram is done. -appreciate cardiology input- no further inpatient cardiac work up recommended, can be discharged  home today  Discharge home today, ambulated well  DISCHARGE CONDITIONS:   Guarded  CONSULTS OBTAINED:   Treatment Team:  Yolonda Kida, MD  DRUG ALLERGIES:   Allergies  Allergen Reactions  . Other Other (See Comments)    Uncoded Allergy. Allergen: bandaid adhesive, Other Reaction: Contac Dermatitis   DISCHARGE MEDICATIONS:   Allergies as of 04/05/2018      Reactions   Other Other (See Comments)   Uncoded Allergy. Allergen: bandaid adhesive, Other Reaction: Contac Dermatitis      Medication List    STOP taking these medications   aspirin 325 MG tablet Replaced by:  aspirin 81 MG EC tablet   glipiZIDE 10 MG 24 hr tablet Commonly known as:  GLUCOTROL XL   isosorbide mononitrate 30 MG 24 hr tablet Commonly known as:  IMDUR   lamoTRIgine 200 MG tablet Commonly known as:  LAMICTAL   metoprolol succinate 25 MG 24 hr tablet Commonly known as:  TOPROL-XL   nitroGLYCERIN 0.4 MG SL tablet Commonly known as:  NITROSTAT   pioglitazone 30 MG tablet Commonly known as:  ACTOS   sertraline 50 MG tablet Commonly known as:  ZOLOFT   simvastatin 40 MG tablet Commonly known as:  ZOCOR     TAKE these medications   aspirin 81 MG EC tablet Take 1 tablet (81 mg total) by mouth daily. Replaces:  aspirin 325 MG tablet   atorvastatin 40 MG tablet Commonly known as:  LIPITOR Take 1 tablet (40 mg total) by mouth daily at 6 PM.   blood glucose meter kit and supplies Kit Dispense based on patient and insurance preference. Use up to  four times daily as directed. (FOR ICD-9 250.00, 250.01).   Insulin Glargine 100 UNIT/ML Solostar Pen Commonly known as:  LANTUS Inject 22 Units into the skin daily.   Insulin Pen Needle 31G X 4 MM Misc 1 pen by Does not apply route daily. Use with each dose and as directed   insulin starter kit- pen needles Misc 1 kit by Other route once for 1 dose.   levothyroxine 100 MCG tablet Commonly known as:  SYNTHROID, LEVOTHROID Take 1  tablet (100 mcg total) by mouth every morning.   lisinopril 10 MG tablet Commonly known as:  PRINIVIL,ZESTRIL Take 1 tablet (10 mg total) by mouth daily. What changed:    medication strength  how much to take   metFORMIN 1000 MG tablet Commonly known as:  GLUCOPHAGE Take 1 tablet (1,000 mg total) by mouth 2 (two) times daily with a meal.   metoprolol tartrate 25 MG tablet Commonly known as:  LOPRESSOR Take 1 tablet (25 mg total) by mouth 2 (two) times daily.        DISCHARGE INSTRUCTIONS:   1. PCP f/u in1  Week  DIET:   Diabetic diet  ACTIVITY:   Activity as tolerated  OXYGEN:   Home Oxygen: No.  Oxygen Delivery: room air  DISCHARGE LOCATION:   home   If you experience worsening of your admission symptoms, develop shortness of breath, life threatening emergency, suicidal or homicidal thoughts you must seek medical attention immediately by calling 911 or calling your MD immediately  if symptoms less severe.  You Must read complete instructions/literature along with all the possible adverse reactions/side effects for all the Medicines you take and that have been prescribed to you. Take any new Medicines after you have completely understood and accpet all the possible adverse reactions/side effects.   Please note  You were cared for by a hospitalist during your hospital stay. If you have any questions about your discharge medications or the care you received while you were in the hospital after you are discharged, you can call the unit and asked to speak with the hospitalist on call if the hospitalist that took care of you is not available. Once you are discharged, your primary care physician will handle any further medical issues. Please note that NO REFILLS for any discharge medications will be authorized once you are discharged, as it is imperative that you return to your primary care physician (or establish a relationship with a primary care physician if you do not  have one) for your aftercare needs so that they can reassess your need for medications and monitor your lab values.    On the day of Discharge:  VITAL SIGNS:   Blood pressure (!) 142/73, pulse 72, temperature 98.1 F (36.7 C), temperature source Oral, resp. rate 18, height _0  (1.956 m), weight 115.2 kg, SpO2 98 %.  PHYSICAL EXAMINATION:   GENERAL:  67 y.o.-year-old patient lying in the bed with no acute distress.  EYES: Pupils equal, round, reactive to light and accommodation. No scleral icterus. Extraocular muscles intact.  HEENT: Head atraumatic, normocephalic. Oropharynx and nasopharynx clear.  NECK:  Supple, no jugular venous distention. No thyroid enlargement, no tenderness.  LUNGS: Normal breath sounds bilaterally, no wheezing, rales,rhonchi or crepitation. No use of accessory muscles of respiration.  CARDIOVASCULAR: S1, S2 normal. No  rubs, or gallops.  2/6 systolic murmur is present ABDOMEN: Soft, nontender, nondistended. Bowel sounds present. No organomegaly or mass.  EXTREMITIES: No pedal edema, cyanosis, or clubbing.  NEUROLOGIC: Cranial nerves II through XII are intact. Muscle strength 5/5 in all extremities. Sensation intact. Gait not checked.  PSYCHIATRIC: The patient is alert and oriented x 3.  SKIN: No obvious rash, lesion, or ulcer. Marland Kitchen   DATA REVIEW:   CBC Recent Labs  Lab 04/04/18 1435  WBC 12.0*  HGB 17.1*  HCT 48.6  PLT 266    Chemistries  Recent Labs  Lab 04/05/18 0401  NA 137  K 3.7  CL 104  CO2 27  GLUCOSE 227*  BUN 14  CREATININE 0.83  CALCIUM 8.5*     Microbiology Results  No results found for this or any previous visit.  RADIOLOGY:  Dg Chest 2 View  Result Date: 04/04/2018 CLINICAL DATA:  PT with known history of diabetes, hyperlipidemia, hypertension, coronary artery disease comes to the emergency room after he felt he was going to pass out at work.Patient states he has been drinking a lot of water for last three weeks. He is  really urinating excessive. Has ran out of his diabetes and blood pressure meds for last three months due to his losing his insurance coverage; in the ER patient was found to have blood sugar of 845 he was dehydrated with elevated creatinine and low sodium 129Smoker EXAM: CHEST - 2 VIEW COMPARISON:  10/28/2015 FINDINGS: Normal heart, mediastinum and hila. Clear lungs.  No pleural effusion or pneumothorax. Skeletal structures are intact. IMPRESSION: No active cardiopulmonary disease. Electronically Signed   By: Lajean Manes M.D.   On: 04/04/2018 19:07     Management plans discussed with the patient, family and they are in agreement.  CODE STATUS:     Code Status Orders  (From admission, onward)         Start     Ordered   04/04/18 2115  Full code  Continuous     04/04/18 2114        Code Status History    Date Active Date Inactive Code Status Order ID Comments User Context   11/01/2015 1046 11/02/2015 1706 Full Code [id number]  Hillary Bow, MD ED   10/28/2015 0552 10/29/2015 1404 Full Code [id number]  Saundra Shelling, MD Inpatient      TOTAL TIME TAKING CARE OF THIS PATIENT: 38 minutes.    Amberle Lyter M.D on 04/05/2018 at 1:39 PM  Between 7am to 6pm - Pager - [phone]  After 6pm go to [email or web address] - [employer] [facility]  Office  [phone]  CC: Primary care physician; Cherrie Distance, MD   Note: This dictation was prepared with Dragon dictation along with smaller phrase technology. Any transcriptional errors that result from this process are unintentional.

## 2018-04-05 NOTE — Progress Notes (Signed)
Pt has ambulated at length and tolerated it well. Iv and tele removed. disch instrucitons and prescrips given. Pt to be discharged this afternoon. disch via w.c. He will provide his own transportation

## 2018-04-05 NOTE — Progress Notes (Signed)
*  PRELIMINARY RESULTS* Echocardiogram 2D Echocardiogram has been performed.  Alexander Tran 04/05/2018, 9:59 AM

## 2018-04-05 NOTE — Progress Notes (Signed)
[facility] [place] NAME: Alexander Tran    MR#:  [id number]  DATE OF BIRTH:  1951/04/03  SUBJECTIVE:  CHIEF COMPLAINT:   Chief Complaint  Patient presents with  . Hyperglycemia   -Feels much better this morning. -Denies any complaints.  Sugars are improved.  Troponins are plateaued  REVIEW OF SYSTEMS:  Review of Systems  Constitutional: Negative for chills, fever and malaise/fatigue.  HENT: Negative for congestion, ear discharge, hearing loss and nosebleeds.   Eyes: Negative for blurred vision and double vision.  Respiratory: Negative for cough, shortness of breath and wheezing.   Cardiovascular: Negative for chest pain and palpitations.  Gastrointestinal: Negative for abdominal pain, constipation, diarrhea, nausea and vomiting.  Genitourinary: Negative for dysuria.  Musculoskeletal: Negative for myalgias.  Neurological: Negative for dizziness, focal weakness, seizures, weakness and headaches.  Psychiatric/Behavioral: Negative for depression.    DRUG ALLERGIES:   Allergies  Allergen Reactions  . Other Other (See Comments)    Uncoded Allergy. Allergen: bandaid adhesive, Other Reaction: Contac Dermatitis    VITALS:  Blood pressure (!) 142/73, pulse 72, temperature 98.1 F (36.7 C), temperature source Oral, resp. rate 18, height 6\' 5"  (1.956 m), weight 115.2 kg, SpO2 98 %.  PHYSICAL EXAMINATION:  Physical Exam   GENERAL:  67 y.o.-year-old patient lying in the bed with no acute distress.  EYES: Pupils equal, round, reactive to light and accommodation. No scleral icterus. Extraocular muscles intact.  HEENT: Head atraumatic, normocephalic. Oropharynx and nasopharynx clear.  NECK:  Supple, no jugular venous distention. No thyroid enlargement, no tenderness.  LUNGS: Normal breath sounds bilaterally, no wheezing, rales,rhonchi or crepitation. No use of accessory muscles of respiration.  CARDIOVASCULAR: S1, S2 normal. No  rubs, or  gallops.  2/6 systolic murmur is present ABDOMEN: Soft, nontender, nondistended. Bowel sounds present. No organomegaly or mass.  EXTREMITIES: No pedal edema, cyanosis, or clubbing.  NEUROLOGIC: Cranial nerves II through XII are intact. Muscle strength 5/5 in all extremities. Sensation intact. Gait not checked.  PSYCHIATRIC: The patient is alert and oriented x 3.  SKIN: No obvious rash, lesion, or ulcer.    LABORATORY PANEL:   CBC Recent Labs  Lab 04/04/18 1435  WBC 12.0*  HGB 17.1*  HCT 48.6  PLT 266   ------------------------------------------------------------------------------------------------------------------  Chemistries  Recent Labs  Lab 04/05/18 0401  NA 137  K 3.7  CL 104  CO2 27  GLUCOSE 227*  BUN 14  CREATININE 0.83  CALCIUM 8.5*   ------------------------------------------------------------------------------------------------------------------  Cardiac Enzymes Recent Labs  Lab 04/05/18 0401  TROPONINI 0.14*   ------------------------------------------------------------------------------------------------------------------  RADIOLOGY:  Dg Chest 2 View  Result Date: 04/04/2018 CLINICAL DATA:  PT with known history of diabetes, hyperlipidemia, hypertension, coronary artery disease comes to the emergency room after he felt he was going to pass out at work.Patient states he has been drinking a lot of water for last three weeks. He is really urinating excessive. Has ran out of his diabetes and blood pressure meds for last three months due to his losing his insurance coverage; in the ER patient was found to have blood sugar of 845 he was dehydrated with elevated creatinine and low sodium 129Smoker EXAM: CHEST - 2 VIEW COMPARISON:  10/28/2015 FINDINGS: Normal heart, mediastinum and hila. Clear lungs.  No pleural effusion or pneumothorax. Skeletal structures are intact. IMPRESSION: No active cardiopulmonary disease. Electronically Signed   By: Lajean Manes M.D.    On: 04/04/2018 19:07    EKG:  Orders placed or performed during the hospital encounter of 04/04/18  . EKG 12-Lead  . EKG 12-Lead  . EKG 12-Lead  . EKG 12-Lead    ASSESSMENT AND PLAN:   67 year old male with past medical history significant for CAD status post medical management, type 2 diabetes mellitus, hypertension not taking any medications at home brought in secondary to feeling bad and polyuria and polydipsia  1.  Uncontrolled diabetes mellitus-not taking his oral medications for a few months now due to lack of insurance. -Sugars were elevated and greater than 800 yesterday. -Started on IV fluids and received NovoLog insulin yesterday.  Started on Lantus. -Appreciate diabetes coordinator input -A1c is 13.  Started on Lantus FlexPen at discharge and also metformin. -Has a follow-up appointment with PCP next week. -Will be discharged on a glucometer  2.  CAD-status post cardiac cath in 2017 showing two-vessel disease with good collaterals and so was discharged on medical management -Patient not taking any medications at home. -Restarted aspirin, statin, metoprolol and lisinopril  3.  Hyperlipidemia-restarted statin  4.  Hypertension-on metoprolol and lisinopril  5.  Elevated troponins-likely demand ischemia.  Patient denies any chest pain.  Troponins are plateaued and echocardiogram is pending. -Awaiting cardiology input if stable cardiac wise, can be discharged home today  6.  DVT prophylaxis-Lovenox     All the records are reviewed and case discussed with Care Management/Social Workerr. Management plans discussed with the patient, family and they are in agreement.  CODE STATUS: Full code  TOTAL TIME TAKING CARE OF THIS PATIENT: 37 minutes.   POSSIBLE D/C today , DEPENDING ON CLINICAL CONDITION.   Gladstone Lighter M.D on 04/05/2018 at 9:56 AM  Between 7am to 6pm - Pager - [phone]  After 6pm go to [email or web address] - password [email or web address]  [facility]  Office  [phone]  CC: Primary care physician; Cherrie Distance, MD

## 2018-04-05 NOTE — Consult Note (Signed)
Reason for Consult: Elevated troponin uncontrolled diabetes Referring Physician: Dr. Fritzi Mandes hospitalist, Dr Heywood Footman [facility]  Alexander Tran is an 67 y.o. male.  HPI: Patient is a 67 year old white male history of diabetes ran out of his diabetes medicines over the last 3 months has been having symptoms of polydipsia polyuria weakness fatigue patient also complains of vertigo almost passed out.  He is been getting up to go to the bathroom 9 or 10 times a night finally decided to come to the emergency room for assessment was found to have blood sugar of 845.  Denies any significant chest pain but troponins were slightly elevated so cardiology was consulted.  Patient denies any cardiac history is a smoker no chest pain  Past Medical History:  Diagnosis Date  . Diabetes mellitus without complication ([place])   . Hyperlipidemia   . Hypertension   . Manic depression ([place])   . 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[facility])     Past Surgical History:  Procedure Laterality Date  . BACK SURGERY    . CARDIAC CATHETERIZATION Left 11/02/2015   Procedure: Left Heart Cath and Coronary Angiography;  Surgeon: Isaias Cowman, MD;  Location: [facility];  Service: Cardiovascular;  Laterality: Left;  . SURGERY SCROTAL / TESTICULAR      Family History  Problem Relation Age of Onset  . Coronary artery disease Father     Social History:  reports that he has been smoking cigarettes. He has been smoking about 1.00 pack per day. He has never used smokeless tobacco. He reports that he drinks alcohol. He reports that he does not use drugs.  Allergies:  Allergies  Allergen Reactions  . Other Other (See Comments)    Uncoded Allergy. Allergen: bandaid adhesive, Other Reaction: Contac Dermatitis    Medications: I have reviewed the patient's current medications.  Results for orders placed or performed during the hospital encounter of 04/04/18 (from the past 48 hour(s))  Basic metabolic panel     Status: Abnormal   Collection Time: 04/04/18  2:35 PM  Result Value Ref Range   Sodium 129 (L) 135 - 145 mmol/L   Potassium 4.1 3.5 - 5.1 mmol/L   Chloride 92 (L) 98 - 111 mmol/L   CO2 23 22 - 32 mmol/L   Glucose, Bld 845 (HH) 70 - 99 mg/dL    Comment: CRITICAL RESULT CALLED TO, READ BACK BY AND VERIFIED WITH STEPHANIE ROWE @1517  04/04/18 AKT   BUN 15 8 - 23 mg/dL   Creatinine, Ser 1.27 (H) 0.61 - 1.24 mg/dL   Calcium 9.3 8.9 - 10.3 mg/dL   GFR calc non Af Amer 59 (L) >60 mL/min   GFR calc Af Amer >60 >60 mL/min   Anion gap 14 5 - 15    Comment: Performed at [name], [address] [place], [place] [address]  CBC     Status: Abnormal   Collection Time: 04/04/18  2:35 PM  Result Value Ref Range   WBC 12.0 (H) 4.0 - 10.5 K/uL   RBC 5.16 4.22 - 5.81 MIL/uL   Hemoglobin 17.1 (H) 13.0 - 17.0 g/dL   HCT 48.6 39.0 - 52.0 %   MCV 94.2 80.0 - 100.0 fL   MCH 33.1 26.0 - 34.0 pg   MCHC 35.2 30.0 - 36.0 g/dL   RDW 14.2 11.5 - 15.5 %   Platelets 266 150 - 400 K/uL   nRBC 0.0 0.0 - 0.2 %    Comment: Performed at [facility], [address] [place], [place] [address]  Troponin I - Add-On to previous collection     Status: Abnormal   Collection Time: 04/04/18  2:35 PM  Result Value Ref Range   Troponin I 0.18 (HH) <0.03 ng/mL    Comment: CRITICAL RESULT CALLED TO, READ BACK BY AND VERIFIED WITH SHERRI Harju @1734  04/04/18 AKT Performed at [name], [address] [place], [place] [address]   Lipid panel     Status: Abnormal   Collection Time: 04/04/18  2:35 PM  Result Value Ref Range   Cholesterol 290 (H) 0 - 200 mg/dL   Triglycerides 587 (H) <150 mg/dL   HDL 38 (L) >40 mg/dL   Total CHOL/HDL Ratio 7.6 RATIO   VLDL UNABLE TO CALCULATE IF TRIGLYCERIDE OVER 400 mg/dL 0 - 40 mg/dL   LDL Cholesterol UNABLE TO CALCULATE IF TRIGLYCERIDE OVER 400 mg/dL 0 - 99 mg/dL    Comment:        Total Cholesterol/HDL:CHD Risk Coronary Heart Disease Risk Table                      Men   Women  1/2 Average Risk   3.4   3.3  Average Risk       5.0   4.4  2 X Average Risk   9.6   7.1  3 X Average Risk  23.4   11.0        Use the calculated Patient Ratio above and the CHD Risk Table to determine the patient's CHD Risk.        ATP III CLASSIFICATION (LDL):  <100     mg/dL   Optimal  100-129  mg/dL   Near or Above                    Optimal  130-159  mg/dL   Borderline  160-189  mg/dL   High  >190     mg/dL   Very High Performed at [name], [address] [place], [place] [address]   TSH     Status: None   Collection Time: 04/04/18  2:35 PM  Result Value Ref Range   TSH 2.679 0.350 - 4.500 uIU/mL    Comment: Performed by a 3rd Generation assay with a functional sensitivity of <=0.01 uIU/mL. Performed at [name], [address] [place], [place] [address]   Glucose, capillary     Status: Abnormal   Collection Time: 04/04/18  2:37 PM  Result Value Ref Range   Glucose-Capillary >600 (HH) 70 - 99 mg/dL  Urinalysis, Complete w Microscopic     Status: Abnormal   Collection Time: 04/04/18  2:38 PM  Result Value Ref Range   Color, Urine STRAW (A) YELLOW   APPearance HAZY (A) CLEAR   Specific Gravity, Urine 1.027 1.005 - 1.030   pH 6.0 5.0 - 8.0   Glucose, UA >=500 (A) NEGATIVE mg/dL   Hgb urine dipstick NEGATIVE NEGATIVE   Bilirubin Urine NEGATIVE NEGATIVE   Ketones, ur NEGATIVE NEGATIVE mg/dL   Protein, ur NEGATIVE NEGATIVE mg/dL   Nitrite NEGATIVE NEGATIVE   Leukocytes, UA SMALL (A) NEGATIVE   RBC / HPF 21-50 0 - 5 RBC/hpf   WBC, UA >50 (H) 0 - 5 WBC/hpf   Bacteria, UA RARE (A) NONE SEEN   Squamous Epithelial / LPF 0-5 0 - 5   WBC Clumps PRESENT    Mucus PRESENT    Budding Yeast PRESENT  Comment: Performed at [facility], [address] [place], [place] [address]  Glucose, capillary     Status: Abnormal   Collection Time: 04/04/18  7:02 PM  Result Value Ref Range   Glucose-Capillary >600 (HH) 70 - 99  mg/dL  Troponin I - Now Then Q6H     Status: Abnormal   Collection Time: 04/04/18  9:28 PM  Result Value Ref Range   Troponin I 0.13 (HH) <0.03 ng/mL    Comment: CRITICAL VALUE NOTED. VALUE IS CONSISTENT WITH PREVIOUSLY REPORTED/CALLED VALUE.  TFK Performed at [facility], [address] [place], [place] [address]   Hemoglobin A1c     Status: Abnormal   Collection Time: 04/04/18  9:28 PM  Result Value Ref Range   Hgb A1c MFr Bld 13.0 (H) 4.8 - 5.6 %    Comment: (NOTE) Pre diabetes:          5.7%-6.4% Diabetes:              >6.4% Glycemic control for   <7.0% adults with diabetes    Mean Plasma Glucose 326.4 mg/dL    Comment: Performed at [facility], [place] [address] [place], [place] [address]  Glucose, capillary     Status: Abnormal   Collection Time: 04/04/18 10:38 PM  Result Value Ref Range   Glucose-Capillary 496 (H) 70 - 99 mg/dL   Comment 1 Notify RN    Comment 2 Document in Chart   Glucose, capillary     Status: Abnormal   Collection Time: 04/04/18 11:47 PM  Result Value Ref Range   Glucose-Capillary 282 (H) 70 - 99 mg/dL   Comment 1 Notify RN    Comment 2 Document in Chart   Glucose, capillary     Status: Abnormal   Collection Time: 04/05/18  1:17 AM  Result Value Ref Range   Glucose-Capillary 168 (H) 70 - 99 mg/dL  Basic metabolic panel     Status: Abnormal   Collection Time: 04/05/18  4:01 AM  Result Value Ref Range   Sodium 137 135 - 145 mmol/L   Potassium 3.7 3.5 - 5.1 mmol/L   Chloride 104 98 - 111 mmol/L   CO2 27 22 - 32 mmol/L   Glucose, Bld 227 (H) 70 - 99 mg/dL   BUN 14 8 - 23 mg/dL   Creatinine, Ser 0.83 0.61 - 1.24 mg/dL   Calcium 8.5 (L) 8.9 - 10.3 mg/dL   GFR calc non Af Amer >60 >60 mL/min   GFR calc Af Amer >60 >60 mL/min   Anion gap 6 5 - 15    Comment: Performed at [facility], [address] [place], [place] [address]  Troponin I - Now Then Q6H     Status: Abnormal   Collection Time: 04/05/18  4:01 AM  Result  Value Ref Range   Troponin I 0.14 (HH) <0.03 ng/mL    Comment: CRITICAL VALUE NOTED. VALUE IS CONSISTENT WITH PREVIOUSLY REPORTED/CALLED VALUE [facility] Performed at [facility], [address] [place], [place] [address]   Troponin I - Now Then Q6H     Status: Abnormal   Collection Time: 04/05/18  9:26 AM  Result Value Ref Range   Troponin I 0.10 (HH) <0.03 ng/mL    Comment: CRITICAL VALUE NOTED. VALUE IS CONSISTENT WITH PREVIOUSLY REPORTED/CALLED VALUE  SDR Performed at [facility], [address] [place], [place] [address]   Glucose, capillary     Status: Abnormal  Collection Time: 04/05/18 11:44 AM  Result Value Ref Range   Glucose-Capillary 367 (H) 70 - 99 mg/dL  Glucose, capillary     Status: Abnormal   Collection Time: 04/05/18  2:34 PM  Result Value Ref Range   Glucose-Capillary 295 (H) 70 - 99 mg/dL    Dg Chest 2 View  Result Date: 04/04/2018 CLINICAL DATA:  PT with known history of diabetes, hyperlipidemia, hypertension, coronary artery disease comes to the emergency room after he felt he was going to pass out at work.Patient states he has been drinking a lot of water for last three weeks. He is really urinating excessive. Has ran out of his diabetes and blood pressure meds for last three months due to his losing his insurance coverage; in the ER patient was found to have blood sugar of 845 he was dehydrated with elevated creatinine and low sodium 129Smoker EXAM: CHEST - 2 VIEW COMPARISON:  10/28/2015 FINDINGS: Normal heart, mediastinum and hila. Clear lungs.  No pleural effusion or pneumothorax. Skeletal structures are intact. IMPRESSION: No active cardiopulmonary disease. Electronically Signed   By: Lajean Manes M.D.   On: 04/04/2018 19:07    Review of Systems  Constitutional: Positive for diaphoresis, malaise/fatigue and weight loss.  HENT: Negative.   Eyes: Negative.   Respiratory: Positive for shortness of breath.   Cardiovascular: Positive for PND.   Gastrointestinal: Negative.   Genitourinary: Positive for dysuria, frequency and urgency.  Musculoskeletal: Positive for myalgias.  Skin: Negative.   Neurological: Positive for dizziness and weakness.  Endo/Heme/Allergies: Positive for polydipsia.  Psychiatric/Behavioral: Negative.    Blood pressure (!) 142/73, pulse 72, temperature 98.1 F (36.7 C), temperature source Oral, resp. rate 18, height 6\' 5"  (1.956 m), weight 115.2 kg, SpO2 98 %. Physical Exam  Constitutional: He is oriented to person, place, and time. He appears well-developed and well-nourished.  HENT:  Head: Normocephalic and atraumatic.  Eyes: Pupils are equal, round, and reactive to light. Conjunctivae and EOM are normal.  Neck: Normal range of motion. Neck supple.  Cardiovascular: Normal rate, regular rhythm and normal heart sounds.  Respiratory: Effort normal and breath sounds normal.  GI: Soft. Bowel sounds are normal.  Musculoskeletal: Normal range of motion. He exhibits edema.  Neurological: He is alert and oriented to person, place, and time. He has normal reflexes.  Skin: Skin is warm and dry.  Psychiatric: He has a normal mood and affect.    Assessment/Plan: Elevated troponin Diabetes out-of-control Elevated blood sugar above 800 Hypertension Hyperlipidemia Manic depression Smoking . Plan Hydration continue diabetes management Glucotrol Glucophage Actos Follow-up troponins probably demand ischemia Continue hypertension control with Prinivil metoprolol Agree with thyroid management with levothyroxine Continue imdur metoprolol for anginal symptoms Agree with Lamictal therapy for bipolar disorder Continue supportive therapy Vies patient to refrain from tobacco abuse Recommend conservative cardiac therapy at the patient follow-up as an outpatient Kyston Gonce D Brevan Luberto 04/05/2018, 5:21 PM

## 2018-04-05 NOTE — Care Management Obs Status (Signed)
[facility] NOTIFICATION   Patient Details  Name: Joshua Soulier MRN: [id number] Date of Birth: August 15, 1950   Medicare Observation Status Notification Given:  Yes    Roniyah Llorens A Melina Mosteller, RN 04/05/2018, 12:10 PM

## 2018-04-06 LAB — URINE CULTURE

## 2018-04-07 LAB — GLUCOSE, CAPILLARY
Glucose-Capillary: >600 mg/dL (ref 70–99)
Glucose-Capillary: 292 mg/dL — ABNORMAL HIGH (ref 70–99)

## 2019-10-19 IMAGING — CR DG CHEST 2V
2 series · 2 of 2 positions shown · non-contrast
Comparison: 10/28/2015

CLINICAL DATA: PT with known history of diabetes, hyperlipidemia,
hypertension, coronary artery disease comes to the emergency room
after he felt he was going to pass out at work.Patient states he has
been drinking a lot of water for last three weeks. He is really
urinating excessive. Has ran out of his diabetes and blood pressure
meds for last three months due to his losing his insurance coverage;
in the ER patient was found to have blood sugar of 845 he was
dehydrated with elevated creatinine and low sodium 8RGRmoker

EXAM:
CHEST - 2 VIEW

[chest pa]
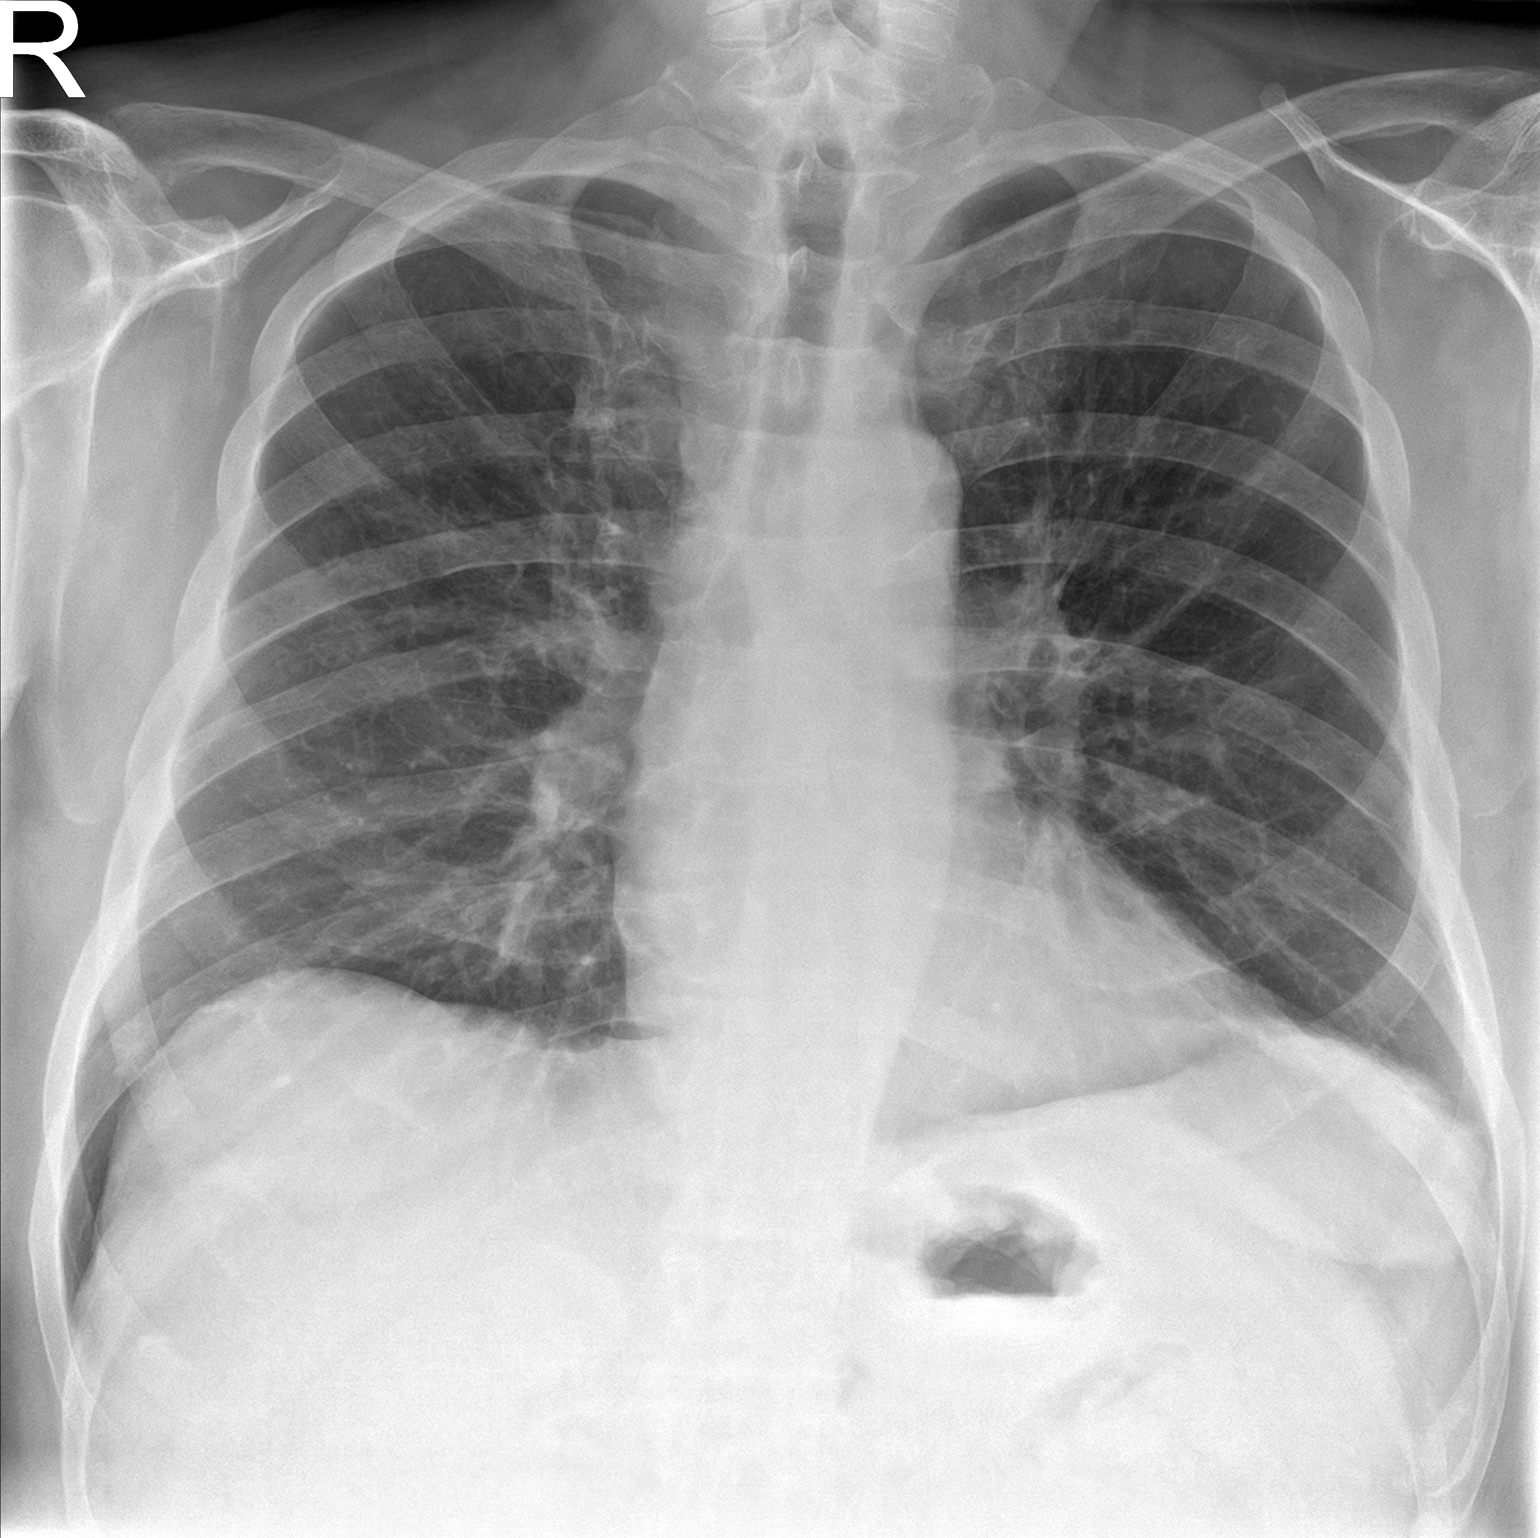

[chest lat]
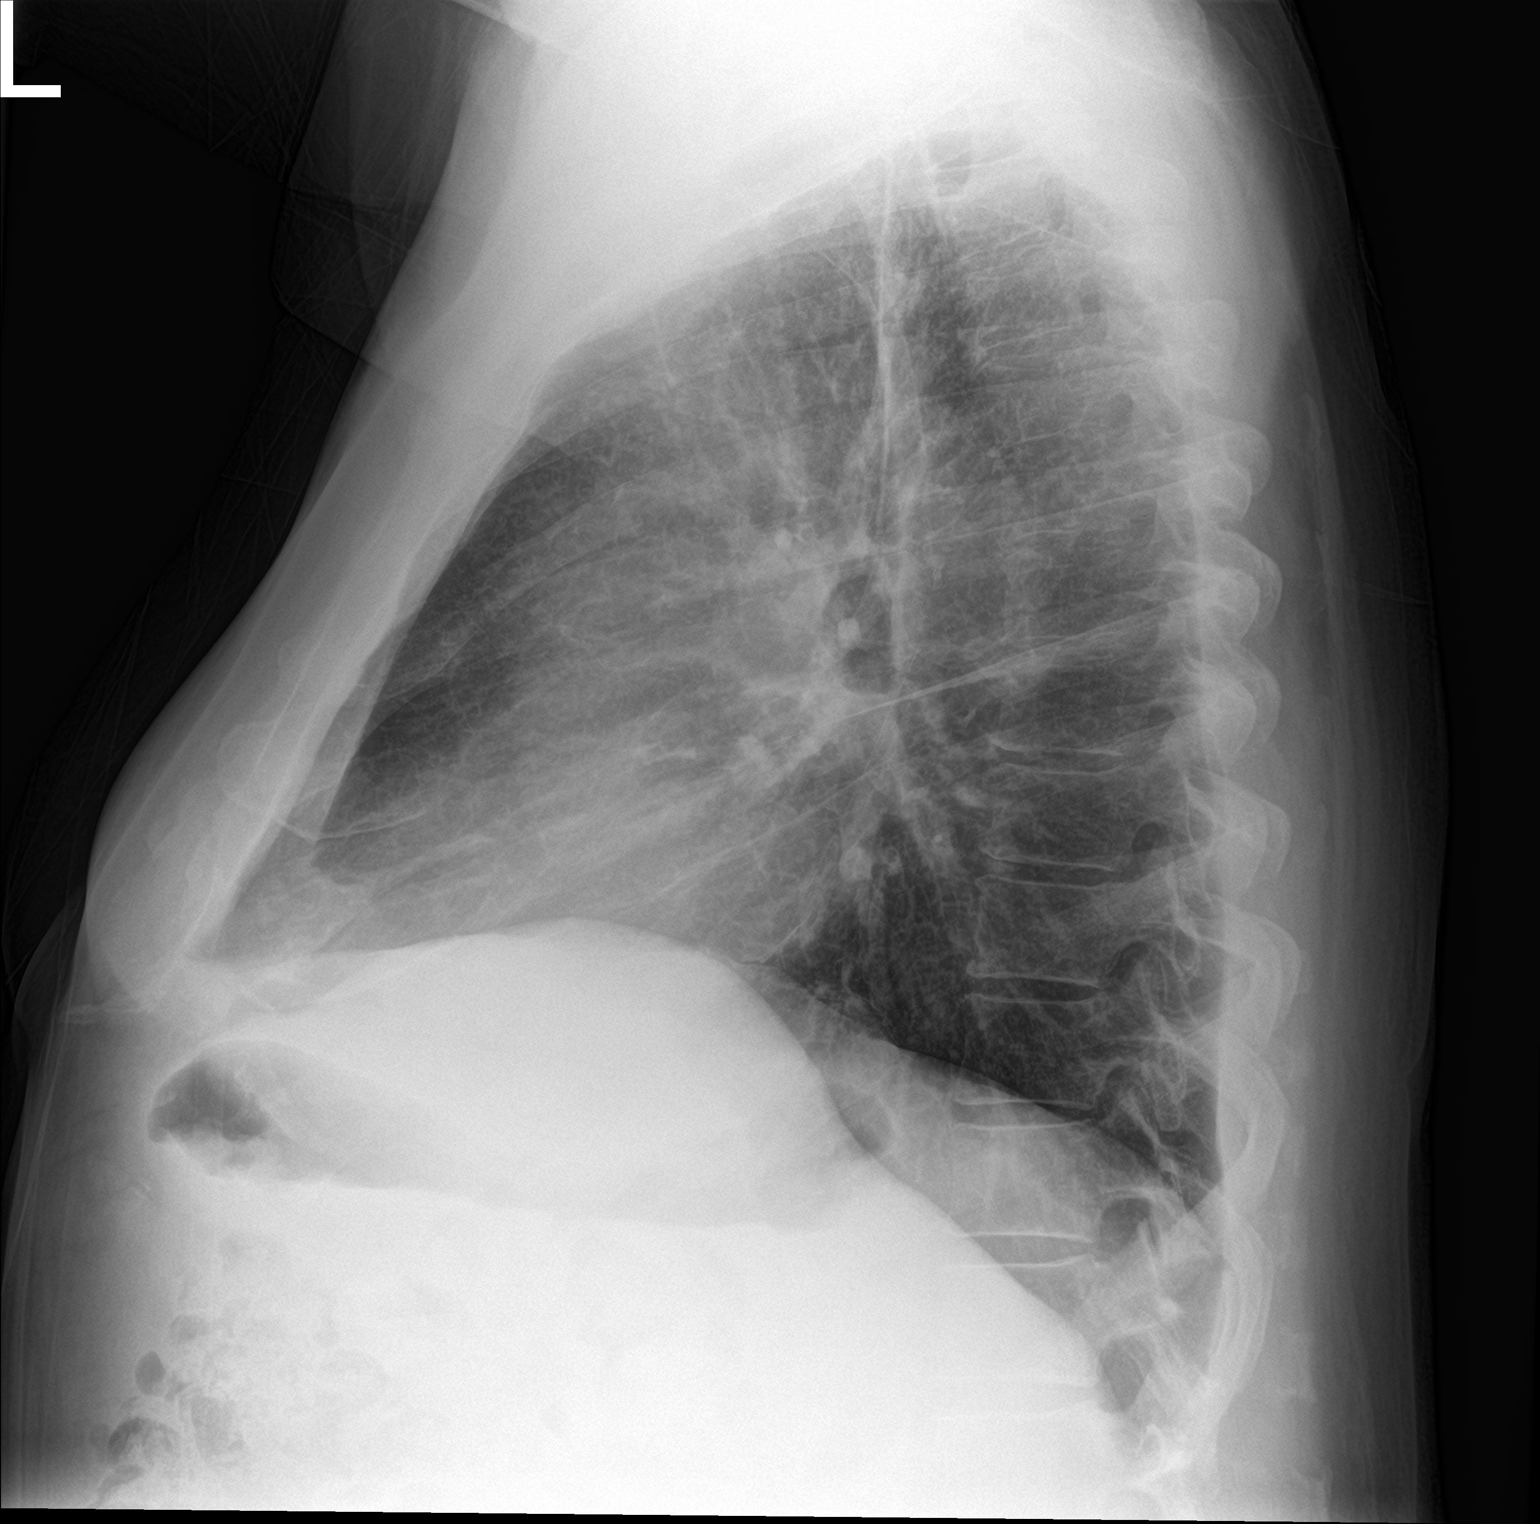

[2 of 2 positions shown; findings below may reference images not displayed]

FINDINGS: Normal heart, mediastinum and hila.

Clear lungs.  No pleural effusion or pneumothorax.

Skeletal structures are intact.
IMPRESSION: No active cardiopulmonary disease.

## 2021-03-02 ENCOUNTER — Emergency Department: Payer: Medicare Other

## 2021-03-02 ENCOUNTER — Other Ambulatory Visit: Payer: Self-pay

## 2021-03-02 ENCOUNTER — Encounter: Payer: Self-pay | Admitting: Intensive Care

## 2021-03-02 DIAGNOSIS — Z6821 Body mass index (BMI) 21.0-21.9, adult: Secondary | ICD-10-CM

## 2021-03-02 DIAGNOSIS — E860 Dehydration: Secondary | ICD-10-CM | POA: Diagnosis present

## 2021-03-02 DIAGNOSIS — Z7982 Long term (current) use of aspirin: Secondary | ICD-10-CM

## 2021-03-02 DIAGNOSIS — Z8547 Personal history of malignant neoplasm of testis: Secondary | ICD-10-CM

## 2021-03-02 DIAGNOSIS — Z79890 Hormone replacement therapy (postmenopausal): Secondary | ICD-10-CM

## 2021-03-02 DIAGNOSIS — Z20822 Contact with and (suspected) exposure to covid-19: Secondary | ICD-10-CM | POA: Diagnosis present

## 2021-03-02 DIAGNOSIS — C159 Malignant neoplasm of esophagus, unspecified: Principal | ICD-10-CM | POA: Diagnosis present

## 2021-03-02 DIAGNOSIS — F319 Bipolar disorder, unspecified: Secondary | ICD-10-CM | POA: Diagnosis present

## 2021-03-02 DIAGNOSIS — R1310 Dysphagia, unspecified: Secondary | ICD-10-CM | POA: Diagnosis present

## 2021-03-02 DIAGNOSIS — I2510 Atherosclerotic heart disease of native coronary artery without angina pectoris: Secondary | ICD-10-CM | POA: Diagnosis present

## 2021-03-02 DIAGNOSIS — Z888 Allergy status to other drugs, medicaments and biological substances status: Secondary | ICD-10-CM

## 2021-03-02 DIAGNOSIS — I10 Essential (primary) hypertension: Secondary | ICD-10-CM | POA: Diagnosis present

## 2021-03-02 DIAGNOSIS — R112 Nausea with vomiting, unspecified: Secondary | ICD-10-CM | POA: Diagnosis present

## 2021-03-02 DIAGNOSIS — K222 Esophageal obstruction: Secondary | ICD-10-CM | POA: Diagnosis present

## 2021-03-02 DIAGNOSIS — Z794 Long term (current) use of insulin: Secondary | ICD-10-CM

## 2021-03-02 DIAGNOSIS — I252 Old myocardial infarction: Secondary | ICD-10-CM

## 2021-03-02 DIAGNOSIS — E119 Type 2 diabetes mellitus without complications: Secondary | ICD-10-CM | POA: Diagnosis present

## 2021-03-02 DIAGNOSIS — Z7984 Long term (current) use of oral hypoglycemic drugs: Secondary | ICD-10-CM

## 2021-03-02 DIAGNOSIS — Z79899 Other long term (current) drug therapy: Secondary | ICD-10-CM

## 2021-03-02 DIAGNOSIS — N179 Acute kidney failure, unspecified: Secondary | ICD-10-CM | POA: Diagnosis present

## 2021-03-02 DIAGNOSIS — E785 Hyperlipidemia, unspecified: Secondary | ICD-10-CM | POA: Diagnosis present

## 2021-03-02 DIAGNOSIS — K219 Gastro-esophageal reflux disease without esophagitis: Secondary | ICD-10-CM | POA: Diagnosis present

## 2021-03-02 DIAGNOSIS — E440 Moderate protein-calorie malnutrition: Secondary | ICD-10-CM | POA: Diagnosis present

## 2021-03-02 DIAGNOSIS — F17210 Nicotine dependence, cigarettes, uncomplicated: Secondary | ICD-10-CM | POA: Diagnosis present

## 2021-03-02 DIAGNOSIS — Z8249 Family history of ischemic heart disease and other diseases of the circulatory system: Secondary | ICD-10-CM

## 2021-03-02 LAB — COMPREHENSIVE METABOLIC PANEL
ALT: 13 U/L (ref 0–44)
AST: 13 U/L — ABNORMAL LOW (ref 15–41)
Albumin: 4.4 g/dL (ref 3.5–5.0)
Alkaline Phosphatase: 66 U/L (ref 38–126)
Anion gap: 13 (ref 5–15)
BUN: 43 mg/dL — ABNORMAL HIGH (ref 8–23)
CO2: 27 mmol/L (ref 22–32)
Calcium: 10.3 mg/dL (ref 8.9–10.3)
Chloride: 96 mmol/L — ABNORMAL LOW (ref 98–111)
Creatinine, Ser: 1.64 mg/dL — ABNORMAL HIGH (ref 0.61–1.24)
GFR, Estimated: 46 mL/min — ABNORMAL LOW (ref >60)
Glucose, Bld: 143 mg/dL — ABNORMAL HIGH (ref 70–99)
Potassium: 4.7 mmol/L (ref 3.5–5.1)
Sodium: 136 mmol/L (ref 135–145)
Total Bilirubin: 1.4 mg/dL — ABNORMAL HIGH (ref 0.3–1.2)
Total Protein: 8 g/dL (ref 6.5–8.1)

## 2021-03-02 LAB — CBC WITH DIFFERENTIAL/PLATELET
Abs Immature Granulocytes: 0.07 10*3/uL (ref 0.00–0.07)
Basophils Absolute: 0.1 10*3/uL (ref 0.0–0.1)
Basophils Relative: 1 %
Eosinophils Absolute: 0.3 10*3/uL (ref 0.0–0.5)
Eosinophils Relative: 4 %
HCT: 40 % (ref 39.0–52.0)
Hemoglobin: 14.7 g/dL (ref 13.0–17.0)
Immature Granulocytes: 1 %
Lymphocytes Relative: 17 %
Lymphs Abs: 1.6 10*3/uL (ref 0.7–4.0)
MCH: 35.8 pg — ABNORMAL HIGH (ref 26.0–34.0)
MCHC: 36.8 g/dL — ABNORMAL HIGH (ref 30.0–36.0)
MCV: 97.3 fL (ref 80.0–100.0)
Monocytes Absolute: 0.8 10*3/uL (ref 0.1–1.0)
Monocytes Relative: 8 %
Neutro Abs: 6.5 10*3/uL (ref 1.7–7.7)
Neutrophils Relative %: 69 %
Platelets: 264 10*3/uL (ref 150–400)
RBC: 4.11 MIL/uL — ABNORMAL LOW (ref 4.22–5.81)
RDW: 14.4 % (ref 11.5–15.5)
WBC: 9.4 10*3/uL (ref 4.0–10.5)
nRBC: 0 % (ref 0.0–0.2)

## 2021-03-02 LAB — LIPASE, BLOOD: Lipase: 32 U/L (ref 11–51)

## 2021-03-02 NOTE — ED Provider Notes (Signed)
Emergency Medicine Provider Triage Evaluation Note  [facility] Medicine Provider Triage Evaluation Note  Alexander Tran, a 70 y.o. male  was evaluated in triage.  Pt complains of 40 lb weight loss and persistent nausea, vomiting, and reflux.  Patient was started on Protonix by his last week.  Was advised that the symptoms did not improve patient report to the ED for further evaluation.  Patient reports generalized fatigue as well as intermittent epigastric and that he describes as severe acid reflux issues.  Review of Systems  Positive: N/V, weight loss Negative: CP, SOB  Physical Exam  BP (!) 153/84 (BP Location: Left Arm)   Pulse 71   Temp 98 F (36.7 C) (Oral)   Resp 20   Ht 6\' 5"  (1.956 m)   Wt 82.6 kg   SpO2 100%   BMI 21.58 kg/m  Gen:   Awake, no distress  NAD Resp:  Normal effort CTA MSK:   Moves extremities without difficulty  Other:  ABD: soft, nontender  Medical Decision Making  Medically screening exam initiated at 6:37 PM.  Appropriate orders placed.  Alexander Tran was informed that the remainder of the evaluation will be completed by another provider, this initial triage assessment does not replace that evaluation, and the importance of remaining in the ED until their evaluation is complete.  Patient ED evaluation management of 40 pound weight loss as well as severe acid reflux issues with associated postprandial nausea and vomiting for the last 5 weeks.   Melvenia Needles, PA-C 03/02/21 1842    Vanessa [place], MD 03/02/21 [phone]

## 2021-03-02 NOTE — ED Triage Notes (Signed)
Report severe acid reflux issues. Cannot keep anything down. Reports feeling dehydrated. Lost 40lb in 5 weeks. Patient has been taking protonix with no relief.

## 2021-03-02 NOTE — ED Triage Notes (Signed)
Pt in via EMS form [facility] with c/o NV and dehydration for 4-5 weeks. Pt was given zofran 8mg  at 1645.

## 2021-03-03 ENCOUNTER — Encounter: Payer: Self-pay | Admitting: Internal Medicine

## 2021-03-03 ENCOUNTER — Encounter
Admission: EM | Disposition: A | Discharge status: Home / Self Care | Payer: Self-pay | Source: Home / Self Care | Attending: Internal Medicine

## 2021-03-03 ENCOUNTER — Observation Stay: Payer: Medicare Other | Admitting: Certified Registered Nurse Anesthetist

## 2021-03-03 ENCOUNTER — Inpatient Hospital Stay
Admission: EM | Admit: 2021-03-03 | Discharge: 2021-03-07 | DRG: 357 | Disposition: A | Discharge status: Home / Self Care | Payer: Medicare Other | Attending: Internal Medicine | Admitting: Internal Medicine

## 2021-03-03 ENCOUNTER — Emergency Department: Payer: Medicare Other

## 2021-03-03 DIAGNOSIS — Z7982 Long term (current) use of aspirin: Secondary | ICD-10-CM | POA: Diagnosis not present

## 2021-03-03 DIAGNOSIS — I2510 Atherosclerotic heart disease of native coronary artery without angina pectoris: Secondary | ICD-10-CM | POA: Diagnosis present

## 2021-03-03 DIAGNOSIS — I252 Old myocardial infarction: Secondary | ICD-10-CM | POA: Diagnosis not present

## 2021-03-03 DIAGNOSIS — E119 Type 2 diabetes mellitus without complications: Secondary | ICD-10-CM | POA: Diagnosis present

## 2021-03-03 DIAGNOSIS — E785 Hyperlipidemia, unspecified: Secondary | ICD-10-CM | POA: Diagnosis present

## 2021-03-03 DIAGNOSIS — N179 Acute kidney failure, unspecified: Secondary | ICD-10-CM | POA: Diagnosis present

## 2021-03-03 DIAGNOSIS — Z7984 Long term (current) use of oral hypoglycemic drugs: Secondary | ICD-10-CM | POA: Diagnosis not present

## 2021-03-03 DIAGNOSIS — K229 Disease of esophagus, unspecified: Secondary | ICD-10-CM

## 2021-03-03 DIAGNOSIS — Z8249 Family history of ischemic heart disease and other diseases of the circulatory system: Secondary | ICD-10-CM | POA: Diagnosis not present

## 2021-03-03 DIAGNOSIS — K219 Gastro-esophageal reflux disease without esophagitis: Secondary | ICD-10-CM | POA: Diagnosis present

## 2021-03-03 DIAGNOSIS — D490 Neoplasm of unspecified behavior of digestive system: Secondary | ICD-10-CM | POA: Diagnosis not present

## 2021-03-03 DIAGNOSIS — Z8547 Personal history of malignant neoplasm of testis: Secondary | ICD-10-CM | POA: Diagnosis not present

## 2021-03-03 DIAGNOSIS — N178 Other acute kidney failure: Secondary | ICD-10-CM

## 2021-03-03 DIAGNOSIS — I10 Essential (primary) hypertension: Secondary | ICD-10-CM | POA: Diagnosis present

## 2021-03-03 DIAGNOSIS — F17210 Nicotine dependence, cigarettes, uncomplicated: Secondary | ICD-10-CM

## 2021-03-03 DIAGNOSIS — K222 Esophageal obstruction: Secondary | ICD-10-CM | POA: Diagnosis present

## 2021-03-03 DIAGNOSIS — Z794 Long term (current) use of insulin: Secondary | ICD-10-CM | POA: Diagnosis not present

## 2021-03-03 DIAGNOSIS — E860 Dehydration: Secondary | ICD-10-CM

## 2021-03-03 DIAGNOSIS — Z20822 Contact with and (suspected) exposure to covid-19: Secondary | ICD-10-CM | POA: Diagnosis present

## 2021-03-03 DIAGNOSIS — R634 Abnormal weight loss: Secondary | ICD-10-CM | POA: Diagnosis not present

## 2021-03-03 DIAGNOSIS — K2289 Other specified disease of esophagus: Secondary | ICD-10-CM | POA: Diagnosis not present

## 2021-03-03 DIAGNOSIS — R112 Nausea with vomiting, unspecified: Secondary | ICD-10-CM | POA: Diagnosis present

## 2021-03-03 DIAGNOSIS — R1310 Dysphagia, unspecified: Secondary | ICD-10-CM

## 2021-03-03 DIAGNOSIS — Z888 Allergy status to other drugs, medicaments and biological substances status: Secondary | ICD-10-CM | POA: Diagnosis not present

## 2021-03-03 DIAGNOSIS — Z79890 Hormone replacement therapy (postmenopausal): Secondary | ICD-10-CM | POA: Diagnosis not present

## 2021-03-03 DIAGNOSIS — C159 Malignant neoplasm of esophagus, unspecified: Secondary | ICD-10-CM | POA: Diagnosis present

## 2021-03-03 DIAGNOSIS — Z95828 Presence of other vascular implants and grafts: Secondary | ICD-10-CM

## 2021-03-03 DIAGNOSIS — Z6821 Body mass index (BMI) 21.0-21.9, adult: Secondary | ICD-10-CM | POA: Diagnosis not present

## 2021-03-03 DIAGNOSIS — Z79899 Other long term (current) drug therapy: Secondary | ICD-10-CM | POA: Diagnosis not present

## 2021-03-03 DIAGNOSIS — E440 Moderate protein-calorie malnutrition: Secondary | ICD-10-CM | POA: Diagnosis present

## 2021-03-03 DIAGNOSIS — F319 Bipolar disorder, unspecified: Secondary | ICD-10-CM | POA: Diagnosis present

## 2021-03-03 HISTORY — PX: ESOPHAGOGASTRODUODENOSCOPY (EGD) WITH PROPOFOL: SHX5813

## 2021-03-03 HISTORY — DX: Gastro-esophageal reflux disease without esophagitis: K21.9

## 2021-03-03 HISTORY — DX: Acute myocardial infarction, unspecified: I21.9

## 2021-03-03 LAB — CBC
HCT: 37.2 % — ABNORMAL LOW (ref 39.0–52.0)
Hemoglobin: 13.6 g/dL (ref 13.0–17.0)
MCH: 35.4 pg — ABNORMAL HIGH (ref 26.0–34.0)
MCHC: 36.6 g/dL — ABNORMAL HIGH (ref 30.0–36.0)
MCV: 96.9 fL (ref 80.0–100.0)
Platelets: 213 10*3/uL (ref 150–400)
RBC: 3.84 MIL/uL — ABNORMAL LOW (ref 4.22–5.81)
RDW: 14.4 % (ref 11.5–15.5)
WBC: 8 10*3/uL (ref 4.0–10.5)
nRBC: 0 % (ref 0.0–0.2)

## 2021-03-03 LAB — URINALYSIS, ROUTINE W REFLEX MICROSCOPIC
Glucose, UA: NEGATIVE mg/dL
Ketones, ur: 20 mg/dL — AB
Nitrite: NEGATIVE
Protein, ur: 100 mg/dL — AB
Specific Gravity, Urine: 1.021 (ref 1.005–1.030)
Squamous Epithelial / HPF: NONE SEEN (ref 0–5)
WBC, UA: >50 WBC/hpf — ABNORMAL HIGH (ref 0–5)
pH: 5 (ref 5.0–8.0)

## 2021-03-03 LAB — CBG MONITORING, ED
Glucose-Capillary: 125 mg/dL — ABNORMAL HIGH (ref 70–99)
Glucose-Capillary: 127 mg/dL — ABNORMAL HIGH (ref 70–99)
Glucose-Capillary: 102 mg/dL — ABNORMAL HIGH (ref 70–99)
Glucose-Capillary: 106 mg/dL — ABNORMAL HIGH (ref 70–99)

## 2021-03-03 LAB — RESP PANEL BY RT-PCR (FLU A&B, COVID) ARPGX2
Influenza A by PCR: NEGATIVE
Influenza B by PCR: NEGATIVE
SARS Coronavirus 2 by RT PCR: NEGATIVE

## 2021-03-03 LAB — GLUCOSE, CAPILLARY
Glucose-Capillary: 95 mg/dL (ref 70–99)
Glucose-Capillary: 88 mg/dL (ref 70–99)

## 2021-03-03 LAB — CREATININE, SERUM
Creatinine, Ser: 1.55 mg/dL — ABNORMAL HIGH (ref 0.61–1.24)
GFR, Estimated: 49 mL/min — ABNORMAL LOW (ref >60)

## 2021-03-03 LAB — HIV ANTIBODY (ROUTINE TESTING W REFLEX): HIV Screen 4th Generation wRfx: NONREACTIVE

## 2021-03-03 SURGERY — ESOPHAGOGASTRODUODENOSCOPY (EGD) WITH PROPOFOL
Anesthesia: General

## 2021-03-03 MED ORDER — ONDANSETRON HCL 4 MG/2ML IJ SOLN
INTRAMUSCULAR | Status: DC | PRN
  Administered 2021-03-03: 4 mg via INTRAVENOUS

## 2021-03-03 MED ORDER — SODIUM CHLORIDE 0.9 % IV SOLN: INTRAVENOUS | Status: DC

## 2021-03-03 MED ORDER — PANTOPRAZOLE SODIUM 40 MG IV SOLR
40.0000 mg | Freq: Once | INTRAVENOUS | Status: AC
  Administered 2021-03-03: 40 mg via INTRAVENOUS
  Filled 2021-03-03: qty 40

## 2021-03-03 MED ORDER — ONDANSETRON HCL 4 MG/2ML IJ SOLN
INTRAMUSCULAR | Status: AC
  Filled 2021-03-03: qty 2

## 2021-03-03 MED ORDER — INSULIN ASPART 100 UNIT/ML IJ SOLN: 0.0000 [IU] | Freq: Four times a day (QID) | INTRAMUSCULAR | Status: DC

## 2021-03-03 MED ORDER — PROPOFOL 10 MG/ML IV BOLUS
INTRAVENOUS | Status: AC
  Filled 2021-03-03: qty 40

## 2021-03-03 MED ORDER — INSULIN ASPART 100 UNIT/ML IJ SOLN: 0.0000 [IU] | INTRAMUSCULAR | Status: DC

## 2021-03-03 MED ORDER — ONDANSETRON HCL 4 MG/2ML IJ SOLN
4.0000 mg | Freq: Four times a day (QID) | INTRAMUSCULAR | Status: DC | PRN
  Administered 2021-03-04: 4 mg via INTRAVENOUS

## 2021-03-03 MED ORDER — LIDOCAINE HCL (CARDIAC) PF 100 MG/5ML IV SOSY
PREFILLED_SYRINGE | INTRAVENOUS | Status: DC | PRN
  Administered 2021-03-03: 25 mg via INTRAVENOUS

## 2021-03-03 MED ORDER — PHENYLEPHRINE HCL (PRESSORS) 10 MG/ML IV SOLN
INTRAVENOUS | Status: DC | PRN
  Administered 2021-03-03 (×2): 80 ug via INTRAVENOUS

## 2021-03-03 MED ORDER — LACTATED RINGERS IV SOLN: INTRAVENOUS | Status: DC | PRN

## 2021-03-03 MED ORDER — ONDANSETRON HCL 4 MG/2ML IJ SOLN: 4.0000 mg | Freq: Once | INTRAMUSCULAR | Status: DC | PRN

## 2021-03-03 MED ORDER — METOCLOPRAMIDE HCL 5 MG/ML IJ SOLN
5.0000 mg | Freq: Once | INTRAMUSCULAR | Status: AC
  Administered 2021-03-03: 5 mg via INTRAVENOUS
  Filled 2021-03-03: qty 2

## 2021-03-03 MED ORDER — SUCCINYLCHOLINE CHLORIDE 200 MG/10ML IV SOSY
PREFILLED_SYRINGE | INTRAVENOUS | Status: DC | PRN
  Administered 2021-03-03: 100 mg via INTRAVENOUS

## 2021-03-03 MED ORDER — SODIUM CHLORIDE 0.9 % IV BOLUS
1000.0000 mL | Freq: Once | INTRAVENOUS | Status: AC
  Administered 2021-03-03: 1000 mL via INTRAVENOUS

## 2021-03-03 MED ORDER — PROPOFOL 10 MG/ML IV BOLUS
INTRAVENOUS | Status: DC | PRN
  Administered 2021-03-03: 150 mg via INTRAVENOUS

## 2021-03-03 MED ORDER — ONDANSETRON HCL 4 MG/2ML IJ SOLN
4.0000 mg | Freq: Once | INTRAMUSCULAR | Status: DC
  Filled 2021-03-03: qty 2

## 2021-03-03 MED ORDER — ONDANSETRON HCL 4 MG PO TABS: 4.0000 mg | ORAL_TABLET | Freq: Four times a day (QID) | ORAL | Status: DC | PRN

## 2021-03-03 MED ORDER — FENTANYL CITRATE PF 50 MCG/ML IJ SOSY: 25.0000 ug | PREFILLED_SYRINGE | INTRAMUSCULAR | Status: DC | PRN

## 2021-03-03 MED ORDER — ENOXAPARIN SODIUM 40 MG/0.4ML IJ SOSY
40.0000 mg | PREFILLED_SYRINGE | INTRAMUSCULAR | Status: DC
  Administered 2021-03-03 – 2021-03-07 (×5): 40 mg via SUBCUTANEOUS
  Filled 2021-03-03 (×5): qty 0.4

## 2021-03-03 NOTE — Hospital Course (Addendum)
70 y.o. male with medical history significant for HTN, DM, CAD who presents with a several week history of nausea, vomiting, not improving with Protonix given by his PCP.  He also reports a 40 pound weight loss, unintentional.  Patient was sent from the urgent care where he was treated tonight with Zofran but without improvement.  He also had dysphagia for last 5 weeks with inability to even drink liquids  10/28: EGD shows completely obstructed distal esophagus mass with food stuck in his esophagus.  Multiple biopsies taken by [name]. oncology and surgery consult  10/29: Gastrostomy tube in place.  Tube feeding started.   10/30: Tolerating tube feeds, possible port placement tomorrow 10/31: Biopsy positive for Moderately differentiated adenocarcinoma.  Port placement today

## 2021-03-03 NOTE — Consult Note (Signed)
Alexander Lame, MD [facility]  [address], [place], [place] [address] Phone: [phone] Fax : [phone]  Consultation  Referring Provider:    Dr. Damita Dunnings Primary Care Physician:  Cherrie Distance, MD Primary Gastroenterologist: [facility]         Reason for Consultation:     Dysphagia  Date of Admission:  03/03/2021 Date of Consultation:  03/03/2021         HPI:   Alexander Tran is a 70 y.o. male who comes in after having 5 weeks of significant heartburn and reports that he has had progressive trouble swallowing with the inability to even keep down liquids this morning.  The patient states he has lost a significant amount of weight.  He reports that over the last 5 weeks it has been approximately 40 pounds and it has been unintentional.  The patient denies any constipation or diarrhea but states he has had very little stools since he has not been eating.  There is no report of any hematochezia.   Despite having symptoms the patient denies contacting his primary gastroenterologist or his primary care provider about the symptoms.  The patient had a CT scan of the abdomen today that showed:  IMPRESSION: 1. Mildly dilated distal esophagus with fluid content. Underlying mass or stricture of the gastroesophageal junction is not excluded. Further evaluation with esophagram or endoscopy on a nonemergent/outpatient basis recommended. 2. Sigmoid diverticulosis. No bowel obstruction. Normal appendix. 3. Aortic Atherosclerosis (ICD10-I70.0).  I am now being asked to see the patient for his dysphagia with a data esophagus sent to rule out any obstructive lesion.  Past Medical History:  Diagnosis Date  . Acid reflux   . Diabetes mellitus without complication ([place])   . Hyperlipidemia   . Hypertension   . Manic depression ([place])   . MI (myocardial infarction) ([place])   . 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[facility])     Past Surgical History:  Procedure Laterality Date  . BACK SURGERY    . CARDIAC  CATHETERIZATION Left 11/02/2015   Procedure: Left Heart Cath and Coronary Angiography;  Surgeon: Isaias Cowman, MD;  Location: [place] [facility];  Service: Cardiovascular;  Laterality: Left;  . SURGERY SCROTAL / TESTICULAR      Prior to Admission medications   Medication Sig Start Date End Date Taking? Authorizing Provider  atorvastatin (LIPITOR) 40 MG tablet Take 1 tablet (40 mg total) by mouth daily at 6 PM. 04/05/18  Yes Gladstone Lighter, MD  levothyroxine (SYNTHROID, LEVOTHROID) 100 MCG tablet Take 1 tablet (100 mcg total) by mouth every morning. 04/05/18  Yes Gladstone Lighter, MD  lisinopril (ZESTRIL) 2.5 MG tablet Take 2.5 mg by mouth daily. 03/01/21  Yes [provider]  metFORMIN (GLUCOPHAGE) 1000 MG tablet Take 1 tablet (1,000 mg total) by mouth 2 (two) times daily with a meal. Patient taking differently: Take 1,000 mg by mouth daily. 04/05/18  Yes Gladstone Lighter, MD  metoprolol tartrate (LOPRESSOR) 25 MG tablet Take 1 tablet (25 mg total) by mouth 2 (two) times daily. 04/05/18  Yes Gladstone Lighter, MD  oxybutynin (DITROPAN-XL) 10 MG 24 hr tablet Take 10 mg by mouth daily. 02/27/21  Yes [provider]  pantoprazole (PROTONIX) 40 MG tablet Take 40 mg by mouth daily. 02/22/21  Yes [provider]  aspirin 81 MG EC tablet Take 1 tablet (81 mg total) by mouth daily. 04/05/18   Gladstone Lighter, MD  aspirin 81 MG EC tablet Take 1 tablet by mouth daily. Patient not  taking: No sig reported 04/05/18   [provider]  blood glucose meter kit and supplies KIT Dispense based on patient and insurance preference. Use up to four times daily as directed. (FOR ICD-9 250.00, 250.01). 04/05/18   Gladstone Lighter, MD  Insulin Glargine (LANTUS SOLOSTAR) 100 UNIT/ML Solostar Pen Inject 22 Units into the skin daily. Patient not taking: No sig reported 04/05/18   Gladstone Lighter, MD  Insulin Pen Needle 31G X 4 MM MISC 1 pen by Does not apply  route daily. Use with each dose and as directed 04/05/18   Gladstone Lighter, MD  lisinopril (PRINIVIL,ZESTRIL) 10 MG tablet Take 1 tablet (10 mg total) by mouth daily. Patient not taking: No sig reported 04/05/18   Gladstone Lighter, MD    Family History  Problem Relation Age of Onset  . Coronary artery disease Father      Social History   Tobacco Use  . Smoking status: Every Day    Packs/day: 1.00    Types: Cigarettes  . Smokeless tobacco: Never  Vaping Use  . Vaping Use: Never used  Substance Use Topics  . Alcohol use: Yes    Alcohol/week: 21.0 standard drinks    Types: 21 Glasses of wine per week  . Drug use: No    Allergies as of 03/02/2021 - Review Complete 03/02/2021  Allergen Reaction Noted  . Iodine  03/02/2021  . Other Other (See Comments) 10/28/2015    Review of Systems:    All systems reviewed and negative except where noted in HPI.   Physical Exam:  Vital signs in last 24 hours: Temp:  [97.7 F (36.5 C)-98.1 F (36.7 C)] 98.1 F (36.7 C) (10/28 1202) Pulse Rate:  [62-73] 73 (10/28 1202) Resp:  [14-20] 14 (10/28 1202) BP: (136-153)/(72-89) 148/85 (10/28 1202) SpO2:  [98 %-100 %] 98 % (10/28 1202) Weight:  [82.6 kg] 82.6 kg (10/27 1753)   General:   Pleasant, cooperative in NAD Head:  Normocephalic and atraumatic. Eyes:   No icterus.   Conjunctiva pink. PERRLA. Ears:  Normal auditory acuity. Neck:  Supple; no masses or thyroidomegaly Lungs: Respirations even and unlabored. Lungs clear to auscultation bilaterally.   No wheezes, crackles, or rhonchi.  Heart:  Regular rate and rhythm;  Without murmur, clicks, rubs or gallops Abdomen:  Soft, nondistended, nontender. Normal bowel sounds. No appreciable masses or hepatomegaly.  No rebound or guarding.  Rectal:  Not performed. Msk:  Symmetrical without gross deformities.    Extremities:  Without edema, cyanosis or clubbing. Neurologic:  Alert and oriented x3;  grossly normal neurologically. Skin:   Intact without significant lesions or rashes. Cervical Nodes:  No significant cervical adenopathy. Psych:  Alert and cooperative. Normal affect.  LAB RESULTS: Recent Labs    03/02/21 1753 03/03/21 0708  WBC 9.4 8.0  HGB 14.7 13.6  HCT 40.0 37.2*  PLT 264 213   BMET Recent Labs    03/02/21 1753 03/03/21 0708  NA 136  --   K 4.7  --   CL 96*  --   CO2 27  --   GLUCOSE 143*  --   BUN 43*  --   CREATININE 1.64* 1.55*  CALCIUM 10.3  --    LFT Recent Labs    03/02/21 1753  PROT 8.0  ALBUMIN 4.4  AST 13*  ALT 13  ALKPHOS 66  BILITOT 1.4*   PT/INR No results for input(s): LABPROT, INR in the last 72 hours.  STUDIES: CT Abdomen Pelvis Wo Contrast  Result Date: 03/03/2021 CLINICAL DATA:  Nausea vomiting. EXAM: CT ABDOMEN AND PELVIS WITHOUT CONTRAST TECHNIQUE: Multidetector CT imaging of the abdomen and pelvis was performed following the standard protocol without IV contrast. COMPARISON:  None. FINDINGS: Evaluation of this exam is limited in the absence of intravenous contrast. Lower chest: The visualized lung bases are clear. There is coronary vascular calcification. No intra-abdominal free air or free fluid. Hepatobiliary: No focal liver abnormality is seen. No gallstones, gallbladder wall thickening, or biliary dilatation. Pancreas: Unremarkable. No pancreatic ductal dilatation or surrounding inflammatory changes. Spleen: Normal in size without focal abnormality. Adrenals/Urinary Tract: The adrenal glands are unremarkable. Kidneys, visualized ureters, and urinary bladder are unremarkable. Stomach/Bowel: Mildly dilated distal esophagus with fluid content. The stomach is nondistended. Underlying mass or stricture of the gastroesophageal junction is not excluded. Further evaluation with esophagram or endoscopy on a nonemergent/outpatient basis recommended. There is moderate stool throughout the colon. There is sigmoid diverticulosis without active inflammatory changes. There is no  bowel obstruction or active inflammation. The appendix is normal. Vascular/Lymphatic: Advanced aortoiliac atherosclerotic disease. The IVC is unremarkable. No portal venous gas. There is no adenopathy. Reproductive: The prostate and seminal vesicles are grossly unremarkable. No pelvic mass. Other: None Musculoskeletal: Osteopenia with degenerative changes of the spine. No acute osseous pathology. IMPRESSION: 1. Mildly dilated distal esophagus with fluid content. Underlying mass or stricture of the gastroesophageal junction is not excluded. Further evaluation with esophagram or endoscopy on a nonemergent/outpatient basis recommended. 2. Sigmoid diverticulosis. No bowel obstruction. Normal appendix. 3. Aortic Atherosclerosis (ICD10-I70.0). Electronically Signed   By: Anner Crete M.D.   On: 03/03/2021 03:17   DG Chest 2 View  Result Date: 03/02/2021 CLINICAL DATA:  Reflux, weight loss EXAM: CHEST - 2 VIEW COMPARISON:  04/04/2018 FINDINGS: Heart and mediastinal contours are within normal limits. No focal opacities or effusions. No acute bony abnormality. IMPRESSION: No active cardiopulmonary disease. Electronically Signed   By: Rolm Baptise M.D.   On: 03/02/2021 19:21      Impression / Plan:   Assessment: Principal Problem:   Intractable nausea and vomiting Active Problems:   Diabetes mellitus without complication ([place])   Hypertension   Esophageal stricture   Coronary artery disease involving native coronary artery of native heart without angina pectoris   AKI (acute kidney injury) (HCC)   Jamorris Ndiaye is a 70 y.o. y/o male with with dysphagia and an abnormal CT scan with a finding of fluid filled esophagus with mild dilation and a history of nausea vomiting.  The patient has a history of tobacco use and has to drink today.  Plan:  The patient will be set up for a upper endoscopy for today to look for any underlying stricture or mass as recommended by the patient CT scan of the abdomen done  today.  The patient has been explained the risks and benefits including aspiration.  He has also been given the parental diagnosis of achalasia versus a benign stricture versus malignancy.  The patient has been explained the plan and agrees with it.  Thank you for involving me in the care of this patient.      LOS: 0 days   Alexander Lame, MD, [facility] 03/03/2021, 1:46 PM,  Pager [phone] 7am-5pm  Check AMION for 5pm -7am coverage and on weekends   Note: This dictation was prepared with Dragon dictation along with smaller phrase technology. Any transcriptional errors that result from this process are unintentional.

## 2021-03-03 NOTE — Transfer of Care (Signed)
Immediate Anesthesia Transfer of Care Note  Patient: Alexander Tran  Procedure(s) Performed: ESOPHAGOGASTRODUODENOSCOPY (EGD) WITH PROPOFOL  Patient Location: PACU  Anesthesia Type:General  Level of Consciousness: awake and patient cooperative  Airway & Oxygen Therapy: Patient Spontanous Breathing and Patient connected to face mask oxygen  Post-op Assessment: Report given to RN and Post -op Vital signs reviewed and stable  Post vital signs: Reviewed and stable  Last Vitals:  Vitals Value Taken Time  BP    Temp    Pulse    Resp    SpO2      Last Pain:  Vitals:   03/03/21 1405  TempSrc: Temporal  PainSc: 0-No pain      Patients Stated Pain Goal: 0 (11/64/35 3912)  Complications: No notable events documented.

## 2021-03-03 NOTE — Assessment & Plan Note (Signed)
Not able to take oral medication due to dysphagia

## 2021-03-03 NOTE — Consult Note (Signed)
Hematology/Oncology Consult note [facility] Telephone:([phone] [phone]  Patient Care Team: Cherrie Distance, MD as PCP - General (Family Medicine)   Name of the patient: Alexander Tran  [id number]  [birth year]    Reason for consult: Esophageal mass   Requesting physician: Dr. [name]  Date of visit: 03/03/2021  History of presenting illness- Patient is a 70 year old male with a past medical history significant for hypertension type 2 diabetes and coronary artery disease who presented with symptoms of nausea vomiting to the ER as well as unintentional weight loss of about 40 pounds.  CT abdomen and pelvis without contrast in the ER showed mildly dilated distal esophagus with fluid content.  Underlying mass or stricture cannot be excluded.  There was no locoregional adenopathy or distant metastatic disease noted on CT abdomen.  Patient was seen by Dr. Verl Blalock and underwent EGD today.  Large fungating mass with no bleeding or stigmata of recent bleeding noted at the GE junction 40 cm from the incisors.  Mass was completely obstructing and circumferential.  Multiple biopsies were taken and are currently pending.  Oncology consulted for further management  Patient lives with his son. Reports feeling well until 5 weeks when he started developing dysphagia to the point that he can now barely swallow. States he has lost 100 pounds in the last 18 months unintentionally.   ECOG PS- 1  Pain scale- 3   Review of systems- Review of Systems  Constitutional:  Positive for malaise/fatigue and weight loss. Negative for chills and fever.  HENT:  Negative for congestion, ear discharge and nosebleeds.   Eyes:  Negative for blurred vision.  Respiratory:  Negative for cough, hemoptysis, sputum production, shortness of breath and wheezing.   Cardiovascular:  Negative for chest pain, palpitations, orthopnea and claudication.  Gastrointestinal:  Negative for abdominal  pain, blood in stool, constipation, diarrhea, heartburn, melena, nausea and vomiting.       Dysphagia  Genitourinary:  Negative for dysuria, flank pain, frequency, hematuria and urgency.  Musculoskeletal:  Negative for back pain, joint pain and myalgias.  Skin:  Negative for rash.  Neurological:  Negative for dizziness, tingling, focal weakness, seizures, weakness and headaches.  Endo/Heme/Allergies:  Does not bruise/bleed easily.  Psychiatric/Behavioral:  Negative for depression and suicidal ideas. The patient does not have insomnia.    Allergies  Allergen Reactions   Iodine    Other Other (See Comments)    Uncoded Allergy. Allergen: bandaid adhesive, Other Reaction: Contac Dermatitis    Patient Active Problem List   Diagnosis Date Noted   Esophageal mass 03/03/2021   Diabetes mellitus without complication (HCC)    Hypertension    Intractable nausea and vomiting    Esophageal stricture    AKI (acute kidney injury) ([place])    Impaired swallowing    Neoplasm of digestive system    Hyperglycemia 04/05/2018   DM (diabetes mellitus), secondary uncontrolled 04/04/2018   Coronary artery disease involving native coronary artery of native heart without angina pectoris 09/06/2016   Chest pain 10/28/2015     Past Medical History:  Diagnosis Date   Acid reflux    Diabetes mellitus without complication ([place])    Hyperlipidemia    Hypertension    Manic depression ([place])    MI (myocardial infarction) [facility])    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[facility])      Past Surgical History:  Procedure Laterality Date   BACK SURGERY     CARDIAC CATHETERIZATION Left 11/02/2015   Procedure: Left  Heart Cath and Coronary Angiography;  Surgeon: Isaias Cowman, MD;  Location: [place] [facility];  Service: Cardiovascular;  Laterality: Left;   SURGERY SCROTAL / TESTICULAR      Social History   Socioeconomic History   Marital status: Married    Spouse name: Not on file   Number of children: Not on file   Years  of education: Not on file   Highest education level: Not on file  Occupational History   Occupation: retired Estate manager/land agent   Occupation: works part time now  Tobacco Use   Smoking status: Every Day    Packs/day: 1.00    Types: Cigarettes   Smokeless tobacco: Never  Vaping Use   Vaping Use: Never used  Substance and Sexual Activity   Alcohol use: Yes    Alcohol/week: 21.0 standard drinks    Types: 21 Glasses of wine per week   Drug use: No   Sexual activity: Not on file  Other Topics Concern   Not on file  Social History Narrative   Not on file   Social Determinants of Health   Financial Resource Strain: Not on file  Food Insecurity: Not on file  Transportation Needs: Not on file  Physical Activity: Not on file  Stress: Not on file  Social Connections: Not on file  Intimate Partner Violence: Not on file     Family History  Problem Relation Age of Onset   Coronary artery disease Father      Current Facility-Administered Medications:    0.9 %  sodium chloride infusion, , Intravenous, Continuous, Athena Masse, MD, Last Rate: 100 mL/hr at 03/03/21 0806, New Bag at 03/03/21 0806   [MAR Hold] enoxaparin (LOVENOX) injection 40 mg, 40 mg, Subcutaneous, Q24H, Judd Gaudier V, MD, 40 mg at 03/03/21 0805   fentaNYL (SUBLIMAZE) injection 25-50 mcg, 25-50 mcg, Intravenous, Q5 min PRN, [place], Amy, MD   [MAR Hold] insulin aspart (novoLOG) injection 0-9 Units, 0-9 Units, Subcutaneous, Q4H, Damita Dunnings, Waldemar Dickens, MD   [MAR Hold] ondansetron (ZOFRAN) tablet 4 mg, 4 mg, Oral, Q6H PRN **OR** [MAR Hold] ondansetron (ZOFRAN) injection 4 mg, 4 mg, Intravenous, Q6H PRN, Athena Masse, MD   ondansetron (ZOFRAN) injection 4 mg, 4 mg, Intravenous, Once PRN, [place], Amy, MD   Physical exam:  Vitals:   03/03/21 1405 03/03/21 1530 03/03/21 1545 03/03/21 1600  BP: 98/73 (!) 156/80 127/76 (!) 147/73  Pulse: 67 71 68 69  Resp: 20 18 19 14   Temp: (!) 96.2 F (35.7 C) (!) 96.9 F (36.1  C)    TempSrc: Temporal     SpO2: 98% 98% 96% 100%  Weight:      Height:       Physical Exam Constitutional:      General: He is not in acute distress.    Comments: Appears fatigued  Cardiovascular:     Rate and Rhythm: Normal rate and regular rhythm.     Heart sounds: Normal heart sounds.  Pulmonary:     Effort: Pulmonary effort is normal.     Breath sounds: Normal breath sounds.  Abdominal:     General: Bowel sounds are normal.     Palpations: Abdomen is soft.     Comments: Scaphoid abdomen  Lymphadenopathy:     Comments: No palpable cervical, supraclavicular, axillary or inguinal adenopathy    Skin:    General: Skin is warm and dry.  Neurological:     Mental Status: He is alert and oriented to person, place,  and time.       CMP Latest Ref Rng & Units 03/03/2021  Glucose 70 - 99 mg/dL -  BUN 8 - 23 mg/dL -  Creatinine 0.61 - 1.24 mg/dL 1.55(H)  Sodium 135 - 145 mmol/L -  Potassium 3.5 - 5.1 mmol/L -  Chloride 98 - 111 mmol/L -  CO2 22 - 32 mmol/L -  Calcium 8.9 - 10.3 mg/dL -  Total Protein 6.5 - 8.1 g/dL -  Total Bilirubin 0.3 - 1.2 mg/dL -  Alkaline Phos 38 - 126 U/L -  AST 15 - 41 U/L -  ALT 0 - 44 U/L -   CBC Latest Ref Rng & Units 03/03/2021  WBC 4.0 - 10.5 K/uL 8.0  Hemoglobin 13.0 - 17.0 g/dL 13.6  Hematocrit 39.0 - 52.0 % 37.2(L)  Platelets 150 - 400 K/uL 213    @IMAGES @  CT Abdomen Pelvis Wo Contrast  Result Date: 03/03/2021 CLINICAL DATA:  Nausea vomiting. EXAM: CT ABDOMEN AND PELVIS WITHOUT CONTRAST TECHNIQUE: Multidetector CT imaging of the abdomen and pelvis was performed following the standard protocol without IV contrast. COMPARISON:  None. FINDINGS: Evaluation of this exam is limited in the absence of intravenous contrast. Lower chest: The visualized lung bases are clear. There is coronary vascular calcification. No intra-abdominal free air or free fluid. Hepatobiliary: No focal liver abnormality is seen. No gallstones, gallbladder wall  thickening, or biliary dilatation. Pancreas: Unremarkable. No pancreatic ductal dilatation or surrounding inflammatory changes. Spleen: Normal in size without focal abnormality. Adrenals/Urinary Tract: The adrenal glands are unremarkable. Kidneys, visualized ureters, and urinary bladder are unremarkable. Stomach/Bowel: Mildly dilated distal esophagus with fluid content. The stomach is nondistended. Underlying mass or stricture of the gastroesophageal junction is not excluded. Further evaluation with esophagram or endoscopy on a nonemergent/outpatient basis recommended. There is moderate stool throughout the colon. There is sigmoid diverticulosis without active inflammatory changes. There is no bowel obstruction or active inflammation. The appendix is normal. Vascular/Lymphatic: Advanced aortoiliac atherosclerotic disease. The IVC is unremarkable. No portal venous gas. There is no adenopathy. Reproductive: The prostate and seminal vesicles are grossly unremarkable. No pelvic mass. Other: None Musculoskeletal: Osteopenia with degenerative changes of the spine. No acute osseous pathology. IMPRESSION: 1. Mildly dilated distal esophagus with fluid content. Underlying mass or stricture of the gastroesophageal junction is not excluded. Further evaluation with esophagram or endoscopy on a nonemergent/outpatient basis recommended. 2. Sigmoid diverticulosis. No bowel obstruction. Normal appendix. 3. Aortic Atherosclerosis (ICD10-I70.0). Electronically Signed   By: Anner Crete M.D.   On: 03/03/2021 03:17   DG Chest 2 View  Result Date: 03/02/2021 CLINICAL DATA:  Reflux, weight loss EXAM: CHEST - 2 VIEW COMPARISON:  04/04/2018 FINDINGS: Heart and mediastinal contours are within normal limits. No focal opacities or effusions. No acute bony abnormality. IMPRESSION: No active cardiopulmonary disease. Electronically Signed   By: Rolm Baptise M.D.   On: 03/02/2021 19:21    Assessment and plan- Patient is a 70 y.o. 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  This is highly concerning for esophageal cancer.  Biopsies were taken and are currently pending.  Patient will need a PET CT scan as an outpatient which I will arrange.  If there is no evidence of distant metastatic disease he would be a potential candidate for neoadjuvant chemoradiation followed by definitive surgery.  Discussed etiology of esophageal cancer and various modalities of treatment including chemotherapy  radiation and potential surgery.  For now his biggest concern is that his mass is completely obstructing and therefore patient cannot get any nutrition orally.  I would therefore recommend surgical consult for PEG tube placement.  Even if patient is deemed to be a surgical candidate in the future PEG tube should not interfere with future surgery.  Will add CEA ferritin and iron studies B12 and folate to his tomorrow morning labs.     Visit Diagnosis 1. AKI (acute kidney injury) ([place])   2. Dehydration   3. Intractable nausea and vomiting     [name], MD, MPH [facility] [phone] 03/03/2021

## 2021-03-03 NOTE — ED Notes (Signed)
Pt laying calmly on side in bed; easily woken with voice/touch; visitor remains at bedside.

## 2021-03-03 NOTE — Progress Notes (Signed)
[name] at bs to discuss results of EGD.

## 2021-03-03 NOTE — ED Notes (Signed)
Provider Wohl at bedside.

## 2021-03-03 NOTE — Assessment & Plan Note (Signed)
S/p EGD esophageal mass which is completely obstructing.  Await surgery and on-call consult

## 2021-03-03 NOTE — Assessment & Plan Note (Signed)
Continue sliding scale for now

## 2021-03-03 NOTE — Assessment & Plan Note (Signed)
Await oncology input.  Multiple biopsies taken at EGD.

## 2021-03-03 NOTE — Anesthesia Preprocedure Evaluation (Signed)
Anesthesia Evaluation  Patient identified by MRN, date of birth, ID band Patient awake    Reviewed: Allergy & Precautions, NPO status , Patient's Chart, lab work & pertinent test results  History of Anesthesia Complications Negative for: history of anesthetic complications  Airway Mallampati: II  TM Distance: >3 FB Neck ROM: Full    Dental  (+) Poor Dentition   Pulmonary neg sleep apnea, neg COPD, Current Smoker and Patient abstained from smoking.,    breath sounds clear to auscultation- rhonchi (-) wheezing      Cardiovascular hypertension, Pt. on medications + CAD and + Past MI  (-) Cardiac Stents and (-) CABG  Rhythm:Regular Rate:Normal - Systolic murmurs and - Diastolic murmurs    Neuro/Psych neg Seizures PSYCHIATRIC DISORDERS Bipolar Disorder negative neurological ROS     GI/Hepatic Neg liver ROS, GERD  ,  Endo/Other  diabetes, Insulin Dependent  Renal/GU Renal InsufficiencyRenal disease     Musculoskeletal negative musculoskeletal ROS (+)   Abdominal (+) - obese,   Peds  Hematology negative hematology ROS (+)   Anesthesia Other Findings Past Medical History: No date: Acid reflux No date: Diabetes mellitus without complication (HCC) No date: Hyperlipidemia No date: Hypertension No date: Manic depression (HCC) No date: MI (myocardial infarction) ([place]) No date: 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HCC)   Reproductive/Obstetrics                             Anesthesia Physical Anesthesia Plan  ASA: 3  Anesthesia Plan: General   Post-op Pain Management:    Induction: Intravenous  PONV Risk Score and Plan: 0 and Propofol infusion  Airway Management Planned: Natural Airway  Additional Equipment:   Intra-op Plan:   Post-operative Plan:   Informed Consent: I have reviewed the patients History and Physical, chart, labs and discussed the procedure including the risks, benefits and  alternatives for the proposed anesthesia with the patient or authorized representative who has indicated his/her understanding and acceptance.     Dental advisory given  Plan Discussed with: CRNA and Anesthesiologist  Anesthesia Plan Comments:         Anesthesia Quick Evaluation

## 2021-03-03 NOTE — Anesthesia Postprocedure Evaluation (Signed)
Anesthesia Post Note  Patient: Alexander Tran  Procedure(s) Performed: ESOPHAGOGASTRODUODENOSCOPY (EGD) WITH PROPOFOL  Patient location during evaluation: PACU Anesthesia Type: General Level of consciousness: awake and alert Pain management: pain level controlled Vital Signs Assessment: post-procedure vital signs reviewed and stable Respiratory status: spontaneous breathing, nonlabored ventilation, respiratory function stable and patient connected to nasal cannula oxygen Cardiovascular status: stable and blood pressure returned to baseline Postop Assessment: no apparent nausea or vomiting Anesthetic complications: no   No notable events documented.   Last Vitals:  Vitals:   03/03/21 1600 03/03/21 1636  BP: (!) 147/73 (!) 151/75  Pulse: 69 61  Resp: 14 18  Temp:  36.7 C  SpO2: 100% 100%    Last Pain:  Vitals:   03/03/21 1640  TempSrc:   PainSc: 0-No pain                 [name]

## 2021-03-03 NOTE — Assessment & Plan Note (Signed)
Well-controlled for now. 

## 2021-03-03 NOTE — ED Provider Notes (Signed)
[facility] Emergency Department Provider Note   ____________________________________________   Event Date/Time   First MD Initiated Contact with Patient 03/03/21 0220     (approximate)  I have reviewed the triage vital signs and the nursing notes.   HISTORY  Chief Complaint Nausea, vomiting    HPI Alexander Tran is a 70 y.o. male who presents to the ED from home with a chief complaint of nausea/vomiting, feeling dehydrated.  Reports severe acid reflux issues.  Symptoms x5 weeks.  Had a telehealth visit last week and placed on Protonix which patient states is not helping his symptoms.  Reports a 40 pound unintentional weight loss in the past 5 weeks.  Denies fever, cough, chest pain, shortness of breath, abdominal pain, diarrhea.  Patient was sent from urgent care for further evaluation; received Zofran 8 mg last evening.     Past Medical History:  Diagnosis Date   Acid reflux    Diabetes mellitus without complication ([place])    Hyperlipidemia    Hypertension    Manic depression ([place])    MI (myocardial infarction) ([place])    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[facility])     Patient Active Problem List   Diagnosis Date Noted   Diabetes mellitus without complication ([place])    Hypertension    Intractable nausea and vomiting    Esophageal stricture    AKI (acute kidney injury) ([place])    Hyperglycemia 04/05/2018   DM (diabetes mellitus), secondary uncontrolled 04/04/2018   Coronary artery disease involving native coronary artery of native heart without angina pectoris 09/06/2016   Chest pain 10/28/2015    Past Surgical History:  Procedure Laterality Date   BACK SURGERY     CARDIAC CATHETERIZATION Left 11/02/2015   Procedure: Left Heart Cath and Coronary Angiography;  Surgeon: Isaias Cowman, MD;  Location: [facility];  Service: Cardiovascular;  Laterality: Left;   SURGERY SCROTAL / TESTICULAR      Prior to Admission medications   Medication Sig Start Date  End Date Taking? Authorizing Provider  aspirin 81 MG EC tablet Take 1 tablet (81 mg total) by mouth daily. 04/05/18   Gladstone Lighter, MD  atorvastatin (LIPITOR) 40 MG tablet Take 1 tablet (40 mg total) by mouth daily at 6 PM. 04/05/18   Gladstone Lighter, MD  blood glucose meter kit and supplies KIT Dispense based on patient and insurance preference. Use up to four times daily as directed. (FOR ICD-9 250.00, 250.01). 04/05/18   Gladstone Lighter, MD  Insulin Glargine (LANTUS SOLOSTAR) 100 UNIT/ML Solostar Pen Inject 22 Units into the skin daily. 04/05/18   Gladstone Lighter, MD  Insulin Pen Needle 31G X 4 MM MISC 1 pen by Does not apply route daily. Use with each dose and as directed 04/05/18   Gladstone Lighter, MD  levothyroxine (SYNTHROID, LEVOTHROID) 100 MCG tablet Take 1 tablet (100 mcg total) by mouth every morning. 04/05/18   Gladstone Lighter, MD  lisinopril (PRINIVIL,ZESTRIL) 10 MG tablet Take 1 tablet (10 mg total) by mouth daily. 04/05/18   Gladstone Lighter, MD  metFORMIN (GLUCOPHAGE) 1000 MG tablet Take 1 tablet (1,000 mg total) by mouth 2 (two) times daily with a meal. 04/05/18   Gladstone Lighter, MD  metoprolol tartrate (LOPRESSOR) 25 MG tablet Take 1 tablet (25 mg total) by mouth 2 (two) times daily. 04/05/18   Gladstone Lighter, MD    Allergies Iodine and Other  Family History  Problem Relation Age of Onset   Coronary artery disease Father  Social History Social History   Tobacco Use   Smoking status: Every Day    Packs/day: 1.00    Types: Cigarettes   Smokeless tobacco: Never  Vaping Use   Vaping Use: Never used  Substance Use Topics   Alcohol use: Yes    Alcohol/week: 21.0 standard drinks    Types: 21 Glasses of wine per week   Drug use: No    Review of Systems  Constitutional: No fever/chills Eyes: No visual changes. ENT: No sore throat. Cardiovascular: Denies chest pain. Respiratory: Denies shortness of breath. Gastrointestinal: No  abdominal pain.  Positive for nausea and vomiting.  No diarrhea.  No constipation. Genitourinary: Negative for dysuria. Musculoskeletal: Negative for back pain. Skin: Negative for rash. Neurological: Negative for headaches, focal weakness or numbness.   ____________________________________________   PHYSICAL EXAM:  VITAL SIGNS: ED Triage Vitals  Enc Vitals Group     BP 03/02/21 1753 (!) 153/84     Pulse Rate 03/02/21 1753 71     Resp 03/02/21 1753 20     Temp 03/02/21 1753 98 F (36.7 C)     Temp Source 03/02/21 1753 Oral     SpO2 03/02/21 1753 100 %     Weight 03/02/21 1753 182 lb (82.6 kg)     Height 03/02/21 1753 _0  (1.956 m)     Head Circumference --      Peak Flow --      Pain Score 03/02/21 1827 7     Pain Loc --      Pain Edu? --      Excl. in [place]? --     Constitutional: Alert and oriented.  Gaunt appearing and in mild acute distress. Eyes: Conjunctivae are normal. PERRL. EOMI. Head: Atraumatic. Nose: No congestion/rhinnorhea. Mouth/Throat: Mucous membranes are mildly dry. Neck: No stridor.   Cardiovascular: Normal rate, regular rhythm. Grossly normal heart sounds.  Good peripheral circulation. Respiratory: Normal respiratory effort.  No retractions. Lungs CTAB. Gastrointestinal: Soft and nontender to light or deep palpation. No distention. No abdominal bruits. No CVA tenderness. Musculoskeletal: No lower extremity tenderness nor edema.  No joint effusions. Neurologic:  Normal speech and language. No gross focal neurologic deficits are appreciated. No gait instability. Skin:  Skin is warm, dry and intact. No rash noted. Psychiatric: Mood and affect are normal. Speech and behavior are normal.  ____________________________________________   LABS (all labs ordered are listed, but only abnormal results are displayed)  Labs Reviewed  COMPREHENSIVE METABOLIC PANEL - Abnormal; Notable for the following components:      Result Value   Chloride 96 (*)     Glucose, Bld 143 (*)    BUN 43 (*)    Creatinine, Ser 1.64 (*)    AST 13 (*)    Total Bilirubin 1.4 (*)    GFR, Estimated 46 (*)    All other components within normal limits  CBC WITH DIFFERENTIAL/PLATELET - Abnormal; Notable for the following components:   RBC 4.11 (*)    MCH 35.8 (*)    MCHC 36.8 (*)    All other components within normal limits  URINALYSIS, ROUTINE W REFLEX MICROSCOPIC - Abnormal; Notable for the following components:   Color, Urine AMBER (*)    APPearance CLOUDY (*)    Hgb urine dipstick SMALL (*)    Bilirubin Urine SMALL (*)    Ketones, ur 20 (*)    Protein, ur 100 (*)    Leukocytes,Ua LARGE (*)    WBC, UA >50 (*)  Bacteria, UA MANY (*)    All other components within normal limits  RESP PANEL BY RT-PCR (FLU A&B, COVID) ARPGX2  LIPASE, BLOOD  HEMOGLOBIN A1C  HIV ANTIBODY (ROUTINE TESTING W REFLEX)  CBC  CREATININE, SERUM   ____________________________________________  EKG  None ____________________________________________  RADIOLOGY I, Levaughn Puccinelli J, personally viewed and evaluated these images (plain radiographs) as part of my medical decision making, as well as reviewing the written report by the radiologist.  ED MD interpretation: CT demonstrates mildly dilated esophagus, cannot rule out underlying mass; no acute cardiopulmonary process  Official radiology report(s): CT Abdomen Pelvis Wo Contrast  Result Date: 03/03/2021 CLINICAL DATA:  Nausea vomiting. EXAM: CT ABDOMEN AND PELVIS WITHOUT CONTRAST TECHNIQUE: Multidetector CT imaging of the abdomen and pelvis was performed following the standard protocol without IV contrast. COMPARISON:  None. FINDINGS: Evaluation of this exam is limited in the absence of intravenous contrast. Lower chest: The visualized lung bases are clear. There is coronary vascular calcification. No intra-abdominal free air or free fluid. Hepatobiliary: No focal liver abnormality is seen. No gallstones, gallbladder wall  thickening, or biliary dilatation. Pancreas: Unremarkable. No pancreatic ductal dilatation or surrounding inflammatory changes. Spleen: Normal in size without focal abnormality. Adrenals/Urinary Tract: The adrenal glands are unremarkable. Kidneys, visualized ureters, and urinary bladder are unremarkable. Stomach/Bowel: Mildly dilated distal esophagus with fluid content. The stomach is nondistended. Underlying mass or stricture of the gastroesophageal junction is not excluded. Further evaluation with esophagram or endoscopy on a nonemergent/outpatient basis recommended. There is moderate stool throughout the colon. There is sigmoid diverticulosis without active inflammatory changes. There is no bowel obstruction or active inflammation. The appendix is normal. Vascular/Lymphatic: Advanced aortoiliac atherosclerotic disease. The IVC is unremarkable. No portal venous gas. There is no adenopathy. Reproductive: The prostate and seminal vesicles are grossly unremarkable. No pelvic mass. Other: None Musculoskeletal: Osteopenia with degenerative changes of the spine. No acute osseous pathology. IMPRESSION: 1. Mildly dilated distal esophagus with fluid content. Underlying mass or stricture of the gastroesophageal junction is not excluded. Further evaluation with esophagram or endoscopy on a nonemergent/outpatient basis recommended. 2. Sigmoid diverticulosis. No bowel obstruction. Normal appendix. 3. Aortic Atherosclerosis (ICD10-I70.0). Electronically Signed   By: Anner Crete M.D.   On: 03/03/2021 03:17   DG Chest 2 View  Result Date: 03/02/2021 CLINICAL DATA:  Reflux, weight loss EXAM: CHEST - 2 VIEW COMPARISON:  04/04/2018 FINDINGS: Heart and mediastinal contours are within normal limits. No focal opacities or effusions. No acute bony abnormality. IMPRESSION: No active cardiopulmonary disease. Electronically Signed   By: Rolm Baptise M.D.   On: 03/02/2021 19:21     ____________________________________________   PROCEDURES  Procedure(s) performed (including Critical Care):  Procedures   ____________________________________________   INITIAL IMPRESSION / ASSESSMENT AND PLAN / ED COURSE  As part of my medical decision making, I reviewed the following data within the [place] History obtained from family, Nursing notes reviewed and incorporated, Labs reviewed, Old chart reviewed, Radiograph reviewed, Discussed with admitting physician, and Notes from prior ED visits     70 year old male presenting with nausea/vomiting, dehydration, unintentional weight loss.  Differential diagnosis includes, but is not limited to, acute appendicitis, renal colic, testicular torsion, urinary tract infection/pyelonephritis, prostatitis,  epididymitis, diverticulitis, small bowel obstruction or ileus, colitis, abdominal aortic aneurysm, gastroenteritis, hernia, etc.   Laboratory results remarkable for AKI; will initiate IV fluid resuscitation, IV Protonix, antiemetic and proceed with CT abdomen/pelvis to evaluate for underlying malignancy.      ____________________________________________  FINAL CLINICAL IMPRESSION(S) / ED DIAGNOSES  Final diagnoses:  AKI (acute kidney injury) ([place])  Dehydration  Intractable nausea and vomiting     ED Discharge Orders     None        Note:  This document was prepared using Dragon voice recognition software and may include unintentional dictation errors.    Paulette Blanch, MD 03/03/21 [phone]

## 2021-03-03 NOTE — Assessment & Plan Note (Signed)
Likely due to esophageal mass seen on EGD.  Await surgery input

## 2021-03-03 NOTE — Anesthesia Procedure Notes (Signed)
Procedure Name: Intubation Date/Time: 03/03/2021 2:48 PM Performed by: Louann Sjogren, CRNA Pre-anesthesia Checklist: Patient identified, Patient being monitored, Timeout performed, Emergency Drugs available and Suction available Patient Re-evaluated:Patient Re-evaluated prior to induction Oxygen Delivery Method: Circle system utilized Preoxygenation: Pre-oxygenation with 100% oxygen Induction Type: IV induction Ventilation: Mask ventilation without difficulty Laryngoscope Size: Mac, McGraph and 4 Grade View: Grade I Tube type: Oral Tube size: 7.5 mm Number of attempts: 1 Airway Equipment and Method: Stylet Placement Confirmation: ETT inserted through vocal cords under direct vision, positive ETCO2 and breath sounds checked- equal and bilateral Secured at: 21 cm Tube secured with: Tape Dental Injury: Teeth and Oropharynx as per pre-operative assessment

## 2021-03-03 NOTE — Assessment & Plan Note (Signed)
Worrisome for cancer.  Await oncology input

## 2021-03-03 NOTE — H&P (Signed)
History and Physical    Alexander Tran EXM:[id number] DOB: 12-Aug-1950 DOA: 03/03/2021  PCP: Cherrie Distance, MD   Patient coming from: home  I have personally briefly reviewed patient's old medical records in [place]  Chief Complaint: nausea, vomiting  HPI: Alexander Tran is a 70 y.o. male with medical history significant for HTN, DM, CAD who presents with a several week history of nausea, vomiting, not improving with Protonix given by his PCP.  He also reports a 40 pound weight loss, unintentional.  He denies cough or shortness of breath, abdominal pain or diarrhea and denies fever or chills.  Patient was sent from the urgent care where he was treated tonight with Zofran but without improvement  ED course: Vitals unremarkable except for slightly elevated BP of 153 Blood work mostly unremarkable except for BUN/creatinine of 43/1.64 with no recent creatinine on record  Imaging: CT abdomen and pelvis showing dilated distal esophagus with fluid content.  Underlying mass or stricture of the GE junction not excluded.  Further evaluation with endoscopy on a nonemergent/outpatient basis recommended  Patient treated with Reglan.  Hospitalist consulted for admission. Review of Systems: As per HPI otherwise all other systems on review of systems negative.    Past Medical History:  Diagnosis Date   Acid reflux    Diabetes mellitus without complication ([place])    Hyperlipidemia    Hypertension    Manic depression (HCC)    MI (myocardial infarction) [facility])    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[facility])     Past Surgical History:  Procedure Laterality Date   BACK SURGERY     CARDIAC CATHETERIZATION Left 11/02/2015   Procedure: Left Heart Cath and Coronary Angiography;  Surgeon: Isaias Cowman, MD;  Location: [facility];  Service: Cardiovascular;  Laterality: Left;   SURGERY SCROTAL / TESTICULAR       reports that he has been smoking cigarettes. He has been smoking an average of 1 pack  per day. He has never used smokeless tobacco. He reports current alcohol use of about 21.0 standard drinks per week. He reports that he does not use drugs.  Allergies  Allergen Reactions   Iodine    Other Other (See Comments)    Uncoded Allergy. Allergen: bandaid adhesive, Other Reaction: Contac Dermatitis    Family History  Problem Relation Age of Onset   Coronary artery disease Father       Prior to Admission medications   Medication Sig Start Date End Date Taking? Authorizing Provider  aspirin 81 MG EC tablet Take 1 tablet (81 mg total) by mouth daily. 04/05/18   Gladstone Lighter, MD  atorvastatin (LIPITOR) 40 MG tablet Take 1 tablet (40 mg total) by mouth daily at 6 PM. 04/05/18   Gladstone Lighter, MD  blood glucose meter kit and supplies KIT Dispense based on patient and insurance preference. Use up to four times daily as directed. (FOR ICD-9 250.00, 250.01). 04/05/18   Gladstone Lighter, MD  Insulin Glargine (LANTUS SOLOSTAR) 100 UNIT/ML Solostar Pen Inject 22 Units into the skin daily. 04/05/18   Gladstone Lighter, MD  Insulin Pen Needle 31G X 4 MM MISC 1 pen by Does not apply route daily. Use with each dose and as directed 04/05/18   Gladstone Lighter, MD  levothyroxine (SYNTHROID, LEVOTHROID) 100 MCG tablet Take 1 tablet (100 mcg total) by mouth every morning. 04/05/18   Gladstone Lighter, MD  lisinopril (PRINIVIL,ZESTRIL) 10 MG tablet Take 1 tablet (10 mg total) by mouth daily. 04/05/18  Gladstone Lighter, MD  metFORMIN (GLUCOPHAGE) 1000 MG tablet Take 1 tablet (1,000 mg total) by mouth 2 (two) times daily with a meal. 04/05/18   Gladstone Lighter, MD  metoprolol tartrate (LOPRESSOR) 25 MG tablet Take 1 tablet (25 mg total) by mouth 2 (two) times daily. 04/05/18   Gladstone Lighter, MD    Physical Exam: Vitals:   03/02/21 1753 03/03/21 0323  BP: (!) 153/84 136/74  Pulse: 71 62  Resp: 20 16  Temp: 98 F (36.7 C) 97.9 F (36.6 C)  TempSrc: Oral Oral  SpO2:  100% 100%  Weight: 82.6 kg   Height: 6' 5"  (1.956 m)      Vitals:   03/02/21 1753 03/03/21 0323  BP: (!) 153/84 136/74  Pulse: 71 62  Resp: 20 16  Temp: 98 F (36.7 C) 97.9 F (36.6 C)  TempSrc: Oral Oral  SpO2: 100% 100%  Weight: 82.6 kg   Height: 6' 5"  (1.956 m)       Constitutional: Alert and oriented x 3 . Not in any apparent distress HEENT:      Head: Normocephalic and atraumatic.         Eyes: PERLA, EOMI, Conjunctivae are normal. Sclera is non-icteric.       Mouth/Throat: Mucous membranes are moist.       Neck: Supple with no signs of meningismus. Cardiovascular: Regular rate and rhythm. No murmurs, gallops, or rubs. 2+ symmetrical distal pulses are present . No JVD. No LE edema Respiratory: Respiratory effort normal .Lungs sounds clear bilaterally. No wheezes, crackles, or rhonchi.  Gastrointestinal: Soft, non tender, and non distended with positive bowel sounds.  Genitourinary: No CVA tenderness. Musculoskeletal: Nontender with normal range of motion in all extremities. No cyanosis, or erythema of extremities. Neurologic:  Face is symmetric. Moving all extremities. No gross focal neurologic deficits . Skin: Skin is warm, dry.  No rash or ulcers Psychiatric: Mood and affect are normal    Labs on Admission: I have personally reviewed following labs and imaging studies  CBC: Recent Labs  Lab 03/02/21 1753  WBC 9.4  NEUTROABS 6.5  HGB 14.7  HCT 40.0  MCV 97.3  PLT 638   Basic Metabolic Panel: Recent Labs  Lab 03/02/21 1753  NA 136  K 4.7  CL 96*  CO2 27  GLUCOSE 143*  BUN 43*  CREATININE 1.64*  CALCIUM 10.3   GFR: Estimated Creatinine Clearance: 51.1 mL/min (A) (by C-G formula based on SCr of 1.64 mg/dL (H)). Liver Function Tests: Recent Labs  Lab 03/02/21 1753  AST 13*  ALT 13  ALKPHOS 66  BILITOT 1.4*  PROT 8.0  ALBUMIN 4.4   Recent Labs  Lab 03/02/21 1753  LIPASE 32   No results for input(s): AMMONIA in the last 168  hours. Coagulation Profile: No results for input(s): INR, PROTIME in the last 168 hours. Cardiac Enzymes: No results for input(s): CKTOTAL, CKMB, CKMBINDEX, TROPONINI in the last 168 hours. BNP (last 3 results) No results for input(s): PROBNP in the last 8760 hours. HbA1C: No results for input(s): HGBA1C in the last 72 hours. CBG: No results for input(s): GLUCAP in the last 168 hours. Lipid Profile: No results for input(s): CHOL, HDL, LDLCALC, TRIG, CHOLHDL, LDLDIRECT in the last 72 hours. Thyroid Function Tests: No results for input(s): TSH, T4TOTAL, FREET4, T3FREE, THYROIDAB in the last 72 hours. Anemia Panel: No results for input(s): VITAMINB12, FOLATE, FERRITIN, TIBC, IRON, RETICCTPCT in the last 72 hours. Urine analysis:    Component Value Date/Time  COLORURINE STRAW (A) 04/04/2018 1438   APPEARANCEUR HAZY (A) 04/04/2018 1438   LABSPEC 1.027 04/04/2018 1438   PHURINE 6.0 04/04/2018 1438   GLUCOSEU >=500 (A) 04/04/2018 1438   HGBUR NEGATIVE 04/04/2018 1438   BILIRUBINUR NEGATIVE 04/04/2018 1438   KETONESUR NEGATIVE 04/04/2018 1438   PROTEINUR NEGATIVE 04/04/2018 1438   NITRITE NEGATIVE 04/04/2018 1438   LEUKOCYTESUR SMALL (A) 04/04/2018 1438    Radiological Exams on Admission: CT Abdomen Pelvis Wo Contrast  Result Date: 03/03/2021 CLINICAL DATA:  Nausea vomiting. EXAM: CT ABDOMEN AND PELVIS WITHOUT CONTRAST TECHNIQUE: Multidetector CT imaging of the abdomen and pelvis was performed following the standard protocol without IV contrast. COMPARISON:  None. FINDINGS: Evaluation of this exam is limited in the absence of intravenous contrast. Lower chest: The visualized lung bases are clear. There is coronary vascular calcification. No intra-abdominal free air or free fluid. Hepatobiliary: No focal liver abnormality is seen. No gallstones, gallbladder wall thickening, or biliary dilatation. Pancreas: Unremarkable. No pancreatic ductal dilatation or surrounding inflammatory changes.  Spleen: Normal in size without focal abnormality. Adrenals/Urinary Tract: The adrenal glands are unremarkable. Kidneys, visualized ureters, and urinary bladder are unremarkable. Stomach/Bowel: Mildly dilated distal esophagus with fluid content. The stomach is nondistended. Underlying mass or stricture of the gastroesophageal junction is not excluded. Further evaluation with esophagram or endoscopy on a nonemergent/outpatient basis recommended. There is moderate stool throughout the colon. There is sigmoid diverticulosis without active inflammatory changes. There is no bowel obstruction or active inflammation. The appendix is normal. Vascular/Lymphatic: Advanced aortoiliac atherosclerotic disease. The IVC is unremarkable. No portal venous gas. There is no adenopathy. Reproductive: The prostate and seminal vesicles are grossly unremarkable. No pelvic mass. Other: None Musculoskeletal: Osteopenia with degenerative changes of the spine. No acute osseous pathology. IMPRESSION: 1. Mildly dilated distal esophagus with fluid content. Underlying mass or stricture of the gastroesophageal junction is not excluded. Further evaluation with esophagram or endoscopy on a nonemergent/outpatient basis recommended. 2. Sigmoid diverticulosis. No bowel obstruction. Normal appendix. 3. Aortic Atherosclerosis (ICD10-I70.0). Electronically Signed   By: Anner Crete M.D.   On: 03/03/2021 03:17   DG Chest 2 View  Result Date: 03/02/2021 CLINICAL DATA:  Reflux, weight loss EXAM: CHEST - 2 VIEW COMPARISON:  04/04/2018 FINDINGS: Heart and mediastinal contours are within normal limits. No focal opacities or effusions. No acute bony abnormality. IMPRESSION: No active cardiopulmonary disease. Electronically Signed   By: Rolm Baptise M.D.   On: 03/02/2021 19:21     Assessment/Plan    Intractable nausea and vomiting Esophageal abnormality on CT -Several week history of nausea vomiting and weight loss - CT abdomen and pelvis showing  possible stricture/?  Possible underlying mass esophagus - IV antiemetics - IV hydration - GI consult to evaluate for endoscopy - We will keep n.p.o.    AKI (acute kidney injury) ([place]) - Secondary to the above - Creatinine 1.64, no recent on record - IV hydration    Diabetes mellitus without complication  -Sliding scale insulin coverage    Hypertension - IV antihypertensives while n.p.o.    Coronary artery disease - No complaints of chest pain - Hold home aspirin, atorvastatin and metoprolol while n.p.o.  DVT prophylaxis: Lovenox  Code Status: full code  Family Communication:  none  Disposition Plan: Back to previous home environment Consults called: GI  Status:observation    Athena Masse MD [facility]     03/03/2021, 4:28 AM

## 2021-03-03 NOTE — Progress Notes (Signed)
  Progress Note    Alexander Tran   VEH:[id number]  DOB: [birth year]  DOA: 03/03/2021     0 Date of Service: 03/03/2021   Clinical Course  70 y.o. male with medical history significant for HTN, DM, CAD who presents with a several week history of nausea, vomiting, not improving with Protonix given by his PCP.  He also reports a 40 pound weight loss, unintentional.  Patient was sent from the urgent care where he was treated tonight with Zofran but without improvement.  He also had dysphagia for last 5 weeks with inability to even drink liquids  10/28: EGD shows completely obstructed distal esophagus mass with food stuck in his esophagus.  Multiple biopsies taken by [name]. oncology and surgery consult possible PEG tube placement.      Assessment and Plan * Intractable nausea and vomiting Likely due to esophageal mass seen on EGD.  Await surgery input  Esophageal mass Worrisome for cancer.  Await oncology input  Neoplasm of digestive system Await oncology input.  Multiple biopsies taken at EGD.  AKI (acute kidney injury) ([place]) Continue IV hydration and monitor  Coronary artery disease involving native coronary artery of native heart without angina pectoris Not able to take oral medication due to dysphagia  Esophageal stricture S/p EGD esophageal mass which is completely obstructing.  Await surgery and on-call consult  Hypertension Well-controlled for now  Diabetes mellitus without complication ([place]) Continue sliding scale for now     Subjective:  Reports dysphagia and weight loss, concerned about something serious.  Denies any further vomiting  Objective Vitals:   03/03/21 1530 03/03/21 1545 03/03/21 1600 03/03/21 1636  BP: (!) 156/80 127/76 (!) 147/73 (!) 151/75  Pulse: 71 68 69 61  Resp: 18 19 14 18   Temp: (!) 96.9 F (36.1 C)   98 F (36.7 C)  TempSrc:    Oral  SpO2: 98% 96% 100% 100%  Weight:      Height:       82.6 kg  Vital signs were reviewed and  unremarkable.   Exam Physical Exam   Constitutional:Alert and oriented x 3 . Not in any apparent distress HEENT: Head:Normocephalic and atraumatic.  Eyes:PERLA, EOMI, Conjunctivae are normal. Sclera is non-icteric.  Mouth/Throat:Mucous membranes are moist.  Neck:Supple with no signs of meningismus. Cardiovascular:Regular rate and rhythm. No murmurs, gallops, or rubs. 2+ symmetrical distal pulses are present . No JVD. No LE edema Respiratory:Respiratory effort normal .Lungs sounds clear bilaterally. No wheezes, crackles, or rhonchi.  Gastrointestinal:Soft, non tender, and non distended with positive bowel sounds.  Genitourinary:No CVA tenderness. Musculoskeletal:Nontender with normal range of motion in all extremities. No cyanosis, or erythema of extremities. Neurologic:  Face is symmetric. Moving all extremities. No gross focal neurologic deficits . Skin:Skin is warm, dry.  No rash or ulcers Psychiatric: Mood and affect are normal   Labs / Other Information My review of labs, imaging, notes and other tests is significant for Elevated creatinine     Disposition Plan: Status is: Inpatient  Remains inpatient appropriate because: Has esophageal mass seen during EGD concerning for malignancy which will need further evaluation by oncology.  Patient will likely need PEG tube due to completely obstructing esophageal mass.  Await surgery input    Time spent: 35 minutes [facility] 03/03/2021, 4:51 PM

## 2021-03-03 NOTE — Op Note (Addendum)
[facility] Patient Name: Alexander Tran Procedure Date: 03/03/2021 2:31 PM MRN: [id number] Account #: [id number] Date of Birth: Jan 10, 1951 Admit Type: Outpatient Age: 70 Room: [facility] ENDO ROOM 3 Gender: Male Note Status: Supervisor Override Instrument Name: Michaelle Birks [id number] Procedure:             Upper GI endoscopy Indications:           Dysphagia Providers:             [name] MD, MD Medicines:             General Anesthesia Complications:         No immediate complications. Procedure:             Pre-Anesthesia Assessment:                        - Prior to the procedure, a History and Physical was                         performed, and patient medications and allergies were                         reviewed. The patient's tolerance of previous                         anesthesia was also reviewed. The risks and benefits                         of the procedure and the sedation options and risks                         were discussed with the patient. All questions were                         answered, and informed consent was obtained. Prior                         Anticoagulants: The patient has taken no previous                         anticoagulant or antiplatelet agents. ASA Grade                         Assessment: II - A patient with mild systemic disease.                         After reviewing the risks and benefits, the patient                         was deemed in satisfactory condition to undergo the                         procedure.                        After obtaining informed consent, the endoscope was                         passed under direct vision. Throughout the procedure,  the patient's blood pressure, pulse, and oxygen                         saturations were monitored continuously. The Endoscope                         was introduced through the mouth, and advanced to the                          lower third of esophagus. The upper GI endoscopy was                         accomplished without difficulty. The patient tolerated                         the procedure well. Findings:      A large, fungating mass with no bleeding and no stigmata of recent       bleeding was found at the gastroesophageal junction, 40 cm from the       incisors. The mass was completely obstructing and circumferential. This       was biopsied with a cold forceps for histology. 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                         Biopsied. Recommendation:        - Return patient to hospital ward for ongoing care.                        - NPO.                        - Refer to an oncologist.                        - Consuider surgical vs radiological PEG tube placement Procedure Code(s):     --- Professional ---                        [phone], Esophagoscopy, flexible, transoral; with                         biopsy, single or multiple Diagnosis Code(s):     --- Professional ---                        R13.10, Dysphagia, unspecified                        D49.0, Neoplasm of unspecified behavior of digestive                         system CPT copyright 2019 American Medical Association. All rights reserved. The codes documented in this report are preliminary and upon coder review may  be revised to meet current compliance requirements. [name] MD, MD 03/03/2021 3:24:47 PM This report has been signed electronically. Number of  Addenda: 0 Note Initiated On: 03/03/2021 2:31 PM Estimated Blood Loss:  Estimated blood loss: none.      [facility]

## 2021-03-03 NOTE — Anesthesia Postprocedure Evaluation (Signed)
Anesthesia Post Note  Patient: Alexander Tran  Procedure(s) Performed: ESOPHAGOGASTRODUODENOSCOPY (EGD) WITH PROPOFOL  Patient location during evaluation: PACU Anesthesia Type: General Level of consciousness: awake and alert Pain management: pain level controlled Vital Signs Assessment: post-procedure vital signs reviewed and stable Respiratory status: spontaneous breathing, nonlabored ventilation, respiratory function stable and patient connected to nasal cannula oxygen Cardiovascular status: blood pressure returned to baseline and stable Postop Assessment: no apparent nausea or vomiting Anesthetic complications: no   No notable events documented.   Last Vitals:  Vitals:   03/03/21 1202 03/03/21 1405  BP: (!) 148/85 98/73  Pulse: 73 67  Resp: 14 20  Temp: 36.7 C (!) 35.7 C  SpO2: 98% 98%    Last Pain:  Vitals:   03/03/21 1405  TempSrc: Temporal  PainSc: 0-No pain                 [name]

## 2021-03-03 NOTE — Assessment & Plan Note (Signed)
Continue IV hydration and monitor

## 2021-03-03 NOTE — Consult Note (Signed)
SURGICAL CONSULTATION NOTE   HISTORY OF PRESENT ILLNESS (HPI):  70 y.o. male presented to [facility] ED for evaluation of difficulty swallowing. Patient reports he has been having issues with swallowing for the last few months.  She endorses having unintentional weight loss of 40 pounds.  He denies abdominal pain.  No pain radiation.  No alleviating or aggravating factors.  At the ED he was found without leukocytosis and adequate hemoglobin.  CT scan of the abdomen showed esophageal dilation with fluid content.  Upper endoscopy done today shows some vague distal esophageal mass and food boluses.  Scope unable to pass the mass.  I personally evaluated the images of the CT scan and the endoscopy.  Surgery is consulted by Dr. Janese Banks in this context for evaluation and management of feeding tube placement.  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 Authorizing Provider  atorvastatin (LIPITOR) 40 MG tablet Take 1 tablet (40 mg total) by mouth daily at 6 PM. 04/05/18  Yes Gladstone Lighter, MD  levothyroxine (SYNTHROID, LEVOTHROID) 100 MCG tablet Take 1 tablet (100 mcg total) by mouth every morning. 04/05/18  Yes Gladstone Lighter, MD  lisinopril (ZESTRIL) 2.5 MG tablet Take 2.5 mg by mouth daily. 03/01/21  Yes [provider]  metFORMIN  (GLUCOPHAGE) 1000 MG tablet Take 1 tablet (1,000 mg total) by mouth 2 (two) times daily with a meal. Patient taking differently: Take 1,000 mg by mouth daily. 04/05/18  Yes Gladstone Lighter, MD  metoprolol tartrate (LOPRESSOR) 25 MG tablet Take 1 tablet (25 mg total) by mouth 2 (two) times daily. 04/05/18  Yes Gladstone Lighter, MD  oxybutynin (DITROPAN-XL) 10 MG 24 hr tablet Take 10 mg by mouth daily. 02/27/21  Yes [provider]  pantoprazole (PROTONIX) 40 MG tablet Take 40 mg by mouth daily. 02/22/21  Yes [provider]  aspirin 81 MG EC tablet Take 1 tablet (81 mg total) by mouth daily. 04/05/18   Gladstone Lighter, MD  aspirin 81 MG EC tablet Take 1 tablet by mouth daily. Patient not taking: No sig reported 04/05/18   [provider]  blood glucose meter kit and supplies KIT Dispense based on patient and insurance preference. Use up to four times daily as directed. (FOR ICD-9 250.00, 250.01). 04/05/18   Gladstone Lighter, MD  Insulin Glargine (LANTUS SOLOSTAR) 100 UNIT/ML Solostar Pen Inject 22 Units into the skin daily. Patient not taking: No sig reported 04/05/18   Gladstone Lighter, MD  Insulin Pen Needle 31G X 4 MM MISC 1 pen by Does not apply route daily. Use with each dose and as directed 04/05/18   Gladstone Lighter, MD  lisinopril (PRINIVIL,ZESTRIL) 10 MG tablet Take 1 tablet (10 mg total) by mouth daily. Patient not taking: No sig reported 04/05/18   Gladstone Lighter,  MD     ALLERGIES:  Allergies  Allergen Reactions   Iodine    Other Other (See Comments)    Uncoded Allergy. Allergen: bandaid adhesive, Other Reaction: Contac Dermatitis     SOCIAL HISTORY:  Social History   Socioeconomic History   Marital status: Married    Spouse name: Not on file   Number of children: Not on file   Years of education: Not on file   Highest education level: Not on file  Occupational History   Occupation: retired Estate manager/land agent   Occupation:  works part time now  Tobacco Use   Smoking status: Every Day    Packs/day: 1.00    Types: Cigarettes   Smokeless tobacco: Never  Vaping Use   Vaping Use: Never used  Substance and Sexual Activity   Alcohol use: Yes    Alcohol/week: 21.0 standard drinks    Types: 21 Glasses of wine per week   Drug use: No   Sexual activity: Not on file  Other Topics Concern   Not on file  Social History Narrative   Not on file   Social Determinants of Health   Financial Resource Strain: Not on file  Food Insecurity: Not on file  Transportation Needs: Not on file  Physical Activity: Not on file  Stress: Not on file  Social Connections: Not on file  Intimate Partner Violence: Not on file      FAMILY HISTORY:  Family History  Problem Relation Age of Onset   Coronary artery disease Father      REVIEW OF SYSTEMS:  Constitutional: Positive weight loss, negative fever, chills, or sweats  Eyes: denies any other vision changes, history of eye injury  ENT: denies sore throat, hearing problems  Respiratory: denies shortness of breath, wheezing  Cardiovascular: denies chest pain, palpitations  Gastrointestinal: Negative abdominal pain, positive nausea and vomiting Genitourinary: denies burning with urination or urinary frequency Musculoskeletal: denies any other joint pains or cramps  Skin: denies any other rashes or skin discolorations  Neurological: denies any other headache, dizziness, weakness  Psychiatric: denies any other depression, anxiety   All other review of systems were negative   VITAL SIGNS:  Temp:  [96.2 F (35.7 C)-98.1 F (36.7 C)] 98 F (36.7 C) (10/28 1636) Pulse Rate:  [61-73] 61 (10/28 1636) Resp:  [14-20] 18 (10/28 1636) BP: (98-156)/(72-89) 151/75 (10/28 1636) SpO2:  [96 %-100 %] 100 % (10/28 1636) Weight:  [82.6 kg] 82.6 kg (10/27 1753)     Height: 6' 5"  (195.6 cm) Weight: 82.6 kg BMI (Calculated): 21.58   INTAKE/OUTPUT:  This shift: Total I/O In: 1000 [IV  Piggyback:1000] Out: -   Last 2 shifts: @IOLAST2SHIFTS @   PHYSICAL EXAM:  Constitutional:  -- Normal body habitus  -- Awake, alert, and oriented x3  Eyes:  -- Pupils equally round and reactive to light  -- No scleral icterus  Ear, nose, and throat:  -- No jugular venous distension  Pulmonary:  -- No crackles  -- Equal breath sounds bilaterally -- Breathing non-labored at rest Cardiovascular:  -- S1, S2 present  -- No pericardial rubs Gastrointestinal:  -- Abdomen soft, nontender, non-distended, no guarding or rebound tenderness -- No abdominal masses appreciated, pulsatile or otherwise  Musculoskeletal and Integumentary:  -- Wounds: None appreciated -- Extremities: B/L UE and LE FROM, hands and feet warm, no edema  Neurologic:  -- Motor function: intact and symmetric -- Sensation: intact and symmetric   Labs:  CBC Latest Ref Rng & Units 03/03/2021  03/02/2021 04/04/2018  WBC 4.0 - 10.5 K/uL 8.0 9.4 12.0(H)  Hemoglobin 13.0 - 17.0 g/dL 13.6 14.7 17.1(H)  Hematocrit 39.0 - 52.0 % 37.2(L) 40.0 48.6  Platelets 150 - 400 K/uL 213 264 266   CMP Latest Ref Rng & Units 03/03/2021 03/02/2021 04/05/2018  Glucose 70 - 99 mg/dL - 143(H) 227(H)  BUN 8 - 23 mg/dL - 43(H) 14  Creatinine 0.61 - 1.24 mg/dL 1.55(H) 1.64(H) 0.83  Sodium 135 - 145 mmol/L - 136 137  Potassium 3.5 - 5.1 mmol/L - 4.7 3.7  Chloride 98 - 111 mmol/L - 96(L) 104  CO2 22 - 32 mmol/L - 27 27  Calcium 8.9 - 10.3 mg/dL - 10.3 8.5(L)  Total Protein 6.5 - 8.1 g/dL - 8.0 -  Total Bilirubin 0.3 - 1.2 mg/dL - 1.4(H) -  Alkaline Phos 38 - 126 U/L - 66 -  AST 15 - 41 U/L - 13(L) -  ALT 0 - 44 U/L - 13 -     Imaging studies:  EXAM: CT ABDOMEN AND PELVIS WITHOUT CONTRAST   TECHNIQUE: Multidetector CT imaging of the abdomen and pelvis was performed following the standard protocol without IV contrast.   COMPARISON:  None.   FINDINGS: Evaluation of this exam is limited in the absence of intravenous contrast.    Lower chest: The visualized lung bases are clear. There is coronary vascular calcification.   No intra-abdominal free air or free fluid.   Hepatobiliary: No focal liver abnormality is seen. No gallstones, gallbladder wall thickening, or biliary dilatation.   Pancreas: Unremarkable. No pancreatic ductal dilatation or surrounding inflammatory changes.   Spleen: Normal in size without focal abnormality.   Adrenals/Urinary Tract: The adrenal glands are unremarkable. Kidneys, visualized ureters, and urinary bladder are unremarkable.   Stomach/Bowel: Mildly dilated distal esophagus with fluid content. The stomach is nondistended. Underlying mass or stricture of the gastroesophageal junction is not excluded. Further evaluation with esophagram or endoscopy on a nonemergent/outpatient basis recommended.   There is moderate stool throughout the colon. There is sigmoid diverticulosis without active inflammatory changes. There is no bowel obstruction or active inflammation. The appendix is normal.   Vascular/Lymphatic: Advanced aortoiliac atherosclerotic disease. The IVC is unremarkable. No portal venous gas. There is no adenopathy.   Reproductive: The prostate and seminal vesicles are grossly unremarkable. No pelvic mass.   Other: None   Musculoskeletal: Osteopenia with degenerative changes of the spine. No acute osseous pathology.   IMPRESSION: 1. Mildly dilated distal esophagus with fluid content. Underlying mass or stricture of the gastroesophageal junction is not excluded. Further evaluation with esophagram or endoscopy on a nonemergent/outpatient basis recommended. 2. Sigmoid diverticulosis. No bowel obstruction. Normal appendix. 3. Aortic Atherosclerosis (ICD10-I70.0).     Electronically Signed   By: Anner Crete M.D.   On: 03/03/2021 03:17  Assessment/Plan:  70 y.o. male with large fungating mass of the distal esophagus, complicated by pertinent comorbidities  including malnutrition, coronary disease.  Patient with newly found large fungating mass of the tail esophagus.  This mass is completely obstructing the esophagus.  Patient has unintended weight loss of 40 pounds the last few months.  This is concerning for severe malnutrition.  Patient was evaluated by medical oncology and is currently on work-up and planning of care for treatment of esophageal cancer.  He was requested to put a feeding tube to be able to get nutrition and start radiation therapy and chemotherapy.  I discussed with the patient and his son about the gastrostomy tube  procedure.  I discussed with the patient the risk of bleeding, infection, injury to adjacent organs, problem with gastrostomy dislodgment, among others.  The patient endorses he understood and agreed to proceed with procedure.  We will schedule patient to OR tomorrow  Arnold Long, MD

## 2021-03-04 ENCOUNTER — Inpatient Hospital Stay: Payer: Medicare Other | Admitting: Pediatrics

## 2021-03-04 ENCOUNTER — Encounter
Admission: EM | Disposition: A | Discharge status: Home / Self Care | Payer: Self-pay | Source: Home / Self Care | Attending: Internal Medicine

## 2021-03-04 ENCOUNTER — Encounter: Payer: Self-pay | Admitting: Internal Medicine

## 2021-03-04 DIAGNOSIS — N179 Acute kidney failure, unspecified: Secondary | ICD-10-CM | POA: Diagnosis not present

## 2021-03-04 DIAGNOSIS — I2510 Atherosclerotic heart disease of native coronary artery without angina pectoris: Secondary | ICD-10-CM | POA: Diagnosis not present

## 2021-03-04 DIAGNOSIS — E860 Dehydration: Secondary | ICD-10-CM | POA: Diagnosis not present

## 2021-03-04 DIAGNOSIS — K2289 Other specified disease of esophagus: Secondary | ICD-10-CM | POA: Diagnosis not present

## 2021-03-04 HISTORY — PX: GASTROSTOMY: SHX5249

## 2021-03-04 LAB — GLUCOSE, CAPILLARY
Glucose-Capillary: 88 mg/dL (ref 70–99)
Glucose-Capillary: 96 mg/dL (ref 70–99)
Glucose-Capillary: 105 mg/dL — ABNORMAL HIGH (ref 70–99)
Glucose-Capillary: 120 mg/dL — ABNORMAL HIGH (ref 70–99)
Glucose-Capillary: 138 mg/dL — ABNORMAL HIGH (ref 70–99)
Glucose-Capillary: 169 mg/dL — ABNORMAL HIGH (ref 70–99)

## 2021-03-04 LAB — CBC
HCT: 33.9 % — ABNORMAL LOW (ref 39.0–52.0)
Hemoglobin: 11.9 g/dL — ABNORMAL LOW (ref 13.0–17.0)
MCH: 33.8 pg (ref 26.0–34.0)
MCHC: 35.1 g/dL (ref 30.0–36.0)
MCV: 96.3 fL (ref 80.0–100.0)
Platelets: 188 10*3/uL (ref 150–400)
RBC: 3.52 MIL/uL — ABNORMAL LOW (ref 4.22–5.81)
RDW: 14.3 % (ref 11.5–15.5)
WBC: 6.2 10*3/uL (ref 4.0–10.5)
nRBC: 0 % (ref 0.0–0.2)

## 2021-03-04 LAB — BASIC METABOLIC PANEL
Anion gap: 3 — ABNORMAL LOW (ref 5–15)
BUN: 35 mg/dL — ABNORMAL HIGH (ref 8–23)
CO2: 26 mmol/L (ref 22–32)
Calcium: 9.1 mg/dL (ref 8.9–10.3)
Chloride: 108 mmol/L (ref 98–111)
Creatinine, Ser: 1.05 mg/dL (ref 0.61–1.24)
GFR, Estimated: >60 mL/min (ref >60)
Glucose, Bld: 88 mg/dL (ref 70–99)
Potassium: 4.3 mmol/L (ref 3.5–5.1)
Sodium: 137 mmol/L (ref 135–145)

## 2021-03-04 LAB — HEMOGLOBIN A1C
Hgb A1c MFr Bld: 5.9 % — ABNORMAL HIGH (ref 4.8–5.6)
Mean Plasma Glucose: 122.63 mg/dL

## 2021-03-04 LAB — VITAMIN B12: Vitamin B-12: 181 pg/mL (ref 180–914)

## 2021-03-04 LAB — FERRITIN: Ferritin: 167 ng/mL (ref 24–336)

## 2021-03-04 LAB — IRON AND TIBC
Iron: 47 ug/dL (ref 45–182)
Saturation Ratios: 28 % (ref 17.9–39.5)
TIBC: 171 ug/dL — ABNORMAL LOW (ref 250–450)
UIBC: 124 ug/dL

## 2021-03-04 SURGERY — INSERTION OF GASTROSTOMY TUBE
Anesthesia: General

## 2021-03-04 MED ORDER — DEXAMETHASONE SODIUM PHOSPHATE 10 MG/ML IJ SOLN
INTRAMUSCULAR | Status: AC
  Filled 2021-03-04: qty 1

## 2021-03-04 MED ORDER — MIDAZOLAM HCL 2 MG/2ML IJ SOLN
INTRAMUSCULAR | Status: AC
  Filled 2021-03-04: qty 2

## 2021-03-04 MED ORDER — FENTANYL CITRATE (PF) 100 MCG/2ML IJ SOLN
INTRAMUSCULAR | Status: AC
  Filled 2021-03-04: qty 2

## 2021-03-04 MED ORDER — DEXAMETHASONE SODIUM PHOSPHATE 10 MG/ML IJ SOLN
INTRAMUSCULAR | Status: DC | PRN
  Administered 2021-03-04: 4 mg via INTRAVENOUS

## 2021-03-04 MED ORDER — KETOROLAC TROMETHAMINE 30 MG/ML IJ SOLN
INTRAMUSCULAR | Status: AC
  Administered 2021-03-04: 30 mg via INTRAVENOUS
  Filled 2021-03-04: qty 1

## 2021-03-04 MED ORDER — OSMOLITE 1.2 CAL PO LIQD: 1000.0000 mL | ORAL | Status: DC

## 2021-03-04 MED ORDER — ACETAMINOPHEN 160 MG/5ML PO SOLN
325.0000 mg | ORAL | Status: DC | PRN
  Filled 2021-03-04: qty 20.3

## 2021-03-04 MED ORDER — OSMOLITE 1.5 CAL PO LIQD
1000.0000 mL | ORAL | Status: DC
  Administered 2021-03-04 – 2021-03-05 (×2): 1000 mL

## 2021-03-04 MED ORDER — PROMETHAZINE HCL 25 MG/ML IJ SOLN
INTRAMUSCULAR | Status: AC
  Filled 2021-03-04: qty 1

## 2021-03-04 MED ORDER — FENTANYL CITRATE (PF) 100 MCG/2ML IJ SOLN
INTRAMUSCULAR | Status: DC | PRN
  Administered 2021-03-04 (×2): 25 ug via INTRAVENOUS

## 2021-03-04 MED ORDER — LACTATED RINGERS IV SOLN: INTRAVENOUS | Status: DC | PRN

## 2021-03-04 MED ORDER — MORPHINE SULFATE (PF) 4 MG/ML IV SOLN: 4.0000 mg | INTRAVENOUS | Status: DC | PRN

## 2021-03-04 MED ORDER — ONDANSETRON HCL 4 MG/2ML IJ SOLN
INTRAMUSCULAR | Status: AC
  Filled 2021-03-04: qty 2

## 2021-03-04 MED ORDER — ACETAMINOPHEN 325 MG PO TABS: 325.0000 mg | ORAL_TABLET | ORAL | Status: DC | PRN

## 2021-03-04 MED ORDER — PROPOFOL 10 MG/ML IV BOLUS
INTRAVENOUS | Status: DC | PRN
  Administered 2021-03-04: 120 mg via INTRAVENOUS
  Administered 2021-03-04: 20 mg via INTRAVENOUS

## 2021-03-04 MED ORDER — SODIUM CHLORIDE FLUSH 0.9 % IV SOLN
INTRAVENOUS | Status: AC
  Filled 2021-03-04: qty 10

## 2021-03-04 MED ORDER — DROPERIDOL 2.5 MG/ML IJ SOLN
0.6250 mg | Freq: Once | INTRAMUSCULAR | Status: AC | PRN
  Administered 2021-03-04: 0.625 mg via INTRAVENOUS
  Filled 2021-03-04 (×2): qty 2

## 2021-03-04 MED ORDER — FENTANYL CITRATE (PF) 100 MCG/2ML IJ SOLN: 25.0000 ug | INTRAMUSCULAR | Status: DC | PRN

## 2021-03-04 MED ORDER — HYDROCODONE-ACETAMINOPHEN 7.5-325 MG PO TABS: 1.0000 | ORAL_TABLET | Freq: Four times a day (QID) | ORAL | Status: AC | PRN

## 2021-03-04 MED ORDER — MEPERIDINE HCL 25 MG/ML IJ SOLN: 6.2500 mg | INTRAMUSCULAR | Status: DC | PRN

## 2021-03-04 MED ORDER — OXYCODONE HCL 5 MG/5ML PO SOLN: 5.0000 mg | Freq: Once | ORAL | Status: DC | PRN

## 2021-03-04 MED ORDER — LIDOCAINE HCL (CARDIAC) PF 100 MG/5ML IV SOSY
PREFILLED_SYRINGE | INTRAVENOUS | Status: DC | PRN
  Administered 2021-03-04: 100 mg via INTRATRACHEAL

## 2021-03-04 MED ORDER — PROSOURCE TF PO LIQD
45.0000 mL | Freq: Three times a day (TID) | ORAL | Status: DC
  Administered 2021-03-04 – 2021-03-06 (×5): 45 mL
  Filled 2021-03-04: qty 45

## 2021-03-04 MED ORDER — OXYCODONE HCL 5 MG PO TABS: 5.0000 mg | ORAL_TABLET | Freq: Once | ORAL | Status: DC | PRN

## 2021-03-04 MED ORDER — INSULIN ASPART 100 UNIT/ML IJ SOLN
0.0000 [IU] | INTRAMUSCULAR | Status: DC
  Administered 2021-03-04: 2 [IU] via SUBCUTANEOUS
  Administered 2021-03-05: 1 [IU] via SUBCUTANEOUS
  Administered 2021-03-05 (×5): 2 [IU] via SUBCUTANEOUS
  Administered 2021-03-05 – 2021-03-06 (×2): 1 [IU] via SUBCUTANEOUS
  Administered 2021-03-07: 2 [IU] via SUBCUTANEOUS
  Administered 2021-03-07: 5 [IU] via SUBCUTANEOUS
  Filled 2021-03-04 (×12): qty 1

## 2021-03-04 MED ORDER — BUPIVACAINE-EPINEPHRINE (PF) 0.25% -1:200000 IJ SOLN
INTRAMUSCULAR | Status: DC | PRN
  Administered 2021-03-04: 30 mL via PERINEURAL

## 2021-03-04 MED ORDER — CEFAZOLIN SODIUM-DEXTROSE 2-3 GM-%(50ML) IV SOLR
INTRAVENOUS | Status: DC | PRN
  Administered 2021-03-04: 2 g via INTRAVENOUS

## 2021-03-04 MED ORDER — PROPOFOL 500 MG/50ML IV EMUL
INTRAVENOUS | Status: AC
  Filled 2021-03-04: qty 50

## 2021-03-04 MED ORDER — INSULIN ASPART 100 UNIT/ML IJ SOLN
0.0000 [IU] | INTRAMUSCULAR | Status: DC
Start: 2021-03-05 — End: 2021-03-04

## 2021-03-04 MED ORDER — PROMETHAZINE HCL 25 MG/ML IJ SOLN
6.2500 mg | INTRAMUSCULAR | Status: DC | PRN
  Administered 2021-03-04: 6.25 mg via INTRAVENOUS

## 2021-03-04 MED ORDER — KETOROLAC TROMETHAMINE 30 MG/ML IJ SOLN: 30.0000 mg | Freq: Once | INTRAMUSCULAR | Status: AC | PRN

## 2021-03-04 MED ORDER — SUCCINYLCHOLINE CHLORIDE 200 MG/10ML IV SOSY
PREFILLED_SYRINGE | INTRAVENOUS | Status: DC | PRN
  Administered 2021-03-04: 120 mg via INTRAVENOUS

## 2021-03-04 SURGICAL SUPPLY — 27 items
BLADE SURG 15 STRL LF DISP TIS (BLADE) ×1 IMPLANT
BLADE SURG 15 STRL SS (BLADE) ×1
CHLORAPREP W/TINT 26 (MISCELLANEOUS) ×2 IMPLANT
COVER LIGHT HANDLE STERIS (MISCELLANEOUS) ×2 IMPLANT
DERMABOND ADVANCED (GAUZE/BANDAGES/DRESSINGS) ×1
DERMABOND ADVANCED .7 DNX12 (GAUZE/BANDAGES/DRESSINGS) ×1 IMPLANT
DRAPE LAPAROTOMY 100X77 ABD (DRAPES) ×2 IMPLANT
ELECT REM PT RETURN 9FT ADLT (ELECTROSURGICAL) ×2
ELECTRODE REM PT RTRN 9FT ADLT (ELECTROSURGICAL) ×1 IMPLANT
GAUZE 4X4 16PLY ~~LOC~~+RFID DBL (SPONGE) ×2 IMPLANT
GOWN STRL REUS W/ TWL LRG LVL3 (GOWN DISPOSABLE) ×1 IMPLANT
GOWN STRL REUS W/TWL LRG LVL3 (GOWN DISPOSABLE) ×1
MANIFOLD NEPTUNE II (INSTRUMENTS) ×2 IMPLANT
NS IRRIG 1000ML POUR BTL (IV SOLUTION) ×2 IMPLANT
PACK BASIN MINOR ARMC (MISCELLANEOUS) ×2 IMPLANT
SPONGE DRAIN TRACH 4X4 STRL 2S (GAUZE/BANDAGES/DRESSINGS) ×2 IMPLANT
SUT MNCRL 4-0 (SUTURE) ×1
SUT MNCRL 4-0 27XMFL (SUTURE) ×1
SUT PDS AB 0 CT1 27 (SUTURE) ×2 IMPLANT
SUT PROLENE 2 0 FS (SUTURE) ×2 IMPLANT
SUT SILK 2 0 SH CR/8 (SUTURE) ×4 IMPLANT
SUT STRATAFIX 0 PDS+ CT-2 23 (SUTURE) ×2
SUTURE MNCRL 4-0 27XMF (SUTURE) ×1 IMPLANT
SUTURE STRATFX 0 PDS+ CT-2 23 (SUTURE) ×1 IMPLANT
SYR 20ML LL LF (SYRINGE) ×4 IMPLANT
TUBE JEJUNO 22X14 (TUBING) ×4 IMPLANT
WATER STERILE IRR 500ML POUR (IV SOLUTION) ×2 IMPLANT

## 2021-03-04 NOTE — Progress Notes (Signed)
Initial Nutrition Assessment  DOCUMENTATION CODES:   Not applicable  INTERVENTION:  Initiate Osmolite 1.5 cal formula @ 20 ml/hr via G-tube and increase by 10 ml every 8 hours to goal rate of 60 ml/hr.   45 ml Prosource TF TID per tube.    Tube feeding regimen provides 2280 kcal, 123 grams of protein, and 1094 ml of H2O.   Monitor magnesium, potassium, and phosphorus BID for at least 3 days, MD to replete as needed, as pt is at risk for refeeding syndrome given significant weight loss and poor po.  NUTRITION DIAGNOSIS:   Inadequate oral intake related to inability to eat as evidenced by NPO status.  GOAL:   Patient will meet greater than or equal to 90% of their needs  MONITOR:   TF tolerance, Skin, Weight trends, Labs, I & O's  REASON FOR ASSESSMENT:   Malnutrition Screening Tool, Consult Enteral/tube feeding initiation and management  ASSESSMENT:   70 y.o. male with medical history significant for HTN, DM, CAD who presents with a several week history of nausea, vomiting, difficulty swallowing. Upper endoscopy shows some vague distal esophageal mass and food boluses. Scope unable to pass the mass. Pt with large fungating mass of the distal esophagus. Currently on work-up and planning of care for treatment of esophageal cancer.  G-tube placed today. RD consulted for tube feeding initiation and management. Pt is currently in PACU. Per MD, pt reports unintentional weight loss of 40 lbs and difficulties swallowing over the past few months. Pt at risk for refeeding syndrome. Plans to start tube feeds at low and slow rate and monitor for tolerance. Unable to complete Nutrition-Focused physical exam at this time.   Labs and medications reviewed.   Diet Order:   Diet Order             Diet NPO time specified  Diet effective now                   EDUCATION NEEDS:   Not appropriate for education at this time  Skin:  Skin Assessment: Skin Integrity Issues: Skin  Integrity Issues:: Incisions Incisions: abdomen  Last BM:  PTA  Height:   Ht Readings from Last 1 Encounters:  03/02/21 6\' 5"  (1.956 m)    Weight:   Wt Readings from Last 1 Encounters:  03/02/21 82.6 kg    Ideal Body Weight:  94.5 kg  BMI:  Body mass index is 21.58 kg/m.  Estimated Nutritional Needs:   Kcal:  6945-0388  Protein:  115-130 grams  Fluid:  >/= 2 L/day  Corrin Parker, MS, RD, LDN RD pager number/after hours weekend pager number on Amion.

## 2021-03-04 NOTE — Assessment & Plan Note (Signed)
Continue sliding scale for now.

## 2021-03-04 NOTE — Anesthesia Preprocedure Evaluation (Addendum)
Anesthesia Evaluation  Patient identified by MRN, date of birth, ID band Patient awake    Reviewed: Allergy & Precautions, NPO status , Patient's Chart, lab work & pertinent test results  History of Anesthesia Complications Negative for: history of anesthetic complications  Airway Mallampati: II  TM Distance: >3 FB Neck ROM: Full    Dental  (+) Poor Dentition,    Pulmonary neg sleep apnea, neg COPD, Current Smoker and Patient abstained from smoking.,    breath sounds clear to auscultation- rhonchi (-) wheezing      Cardiovascular hypertension, Pt. on medications + CAD and + Past MI  (-) Cardiac Stents and (-) CABG  Rhythm:Regular Rate:Normal - Systolic murmurs and - Diastolic murmurs    Neuro/Psych neg Seizures PSYCHIATRIC DISORDERS Bipolar Disorder negative neurological ROS     GI/Hepatic Neg liver ROS, GERD  , Obstructing esophageal tumor c/f esophageal CA; s/p EGD 03/03/21 w/ food boluses & tumor found; p/f GT insertion   Endo/Other  diabetes, Insulin Dependent  Renal/GU Renal InsufficiencyRenal disease     Musculoskeletal negative musculoskeletal ROS (+)   Abdominal (+) - obese,   Peds  Hematology negative hematology ROS (+)   Anesthesia Other Findings Past Medical History: No date: Acid reflux No date: Diabetes mellitus without complication (HCC) No date: Hyperlipidemia No date: Hypertension No date: Manic depression (HCC) No date: MI (myocardial infarction) ([place]) No date: 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[place])   Reproductive/Obstetrics                            Anesthesia Physical  Anesthesia Plan  ASA: 3  Anesthesia Plan: General   Post-op Pain Management:    Induction: Intravenous and Rapid sequence  PONV Risk Score and Plan: 0 and Ondansetron  Airway Management Planned: Oral ETT  Additional Equipment:   Intra-op Plan:   Post-operative Plan:   Informed Consent: I have  reviewed the patients History and Physical, chart, labs and discussed the procedure including the risks, benefits and alternatives for the proposed anesthesia with the patient or authorized representative who has indicated his/her understanding and acceptance.     Dental advisory given  Plan Discussed with: CRNA and Anesthesiologist  Anesthesia Plan Comments: (Given obstructive esophageal tumor, will plan for RSI, GETA for GT insertion.)        Anesthesia Quick Evaluation

## 2021-03-04 NOTE — Transfer of Care (Addendum)
Immediate Anesthesia Transfer of Care Note  Patient: Alexander Tran  Procedure(s) Performed: INSERTION OF GASTROSTOMY TUBE  Patient Location: PACU  Anesthesia Type:General  Level of Consciousness: awake, alert , oriented and patient cooperative  Airway & Oxygen Therapy: Patient Spontanous Breathing  Post-op Assessment: Report given to RN, Post -op Vital signs reviewed and stable and Patient moving all extremities X 4  Post vital signs: stable    Last Vitals:  Vitals Value Taken Time  BP 124/59 03/04/21 1129  Temp    Pulse 56 03/04/21 1130  Resp 17 03/04/21 1130  SpO2 100 % 03/04/21 1130  Vitals shown include unvalidated device data.  Last Pain:  Vitals:   03/04/21 1129  TempSrc: Skin  PainSc: 0-No pain      Patients Stated Pain Goal: 0 (37/62/83 1517)  Complications: No notable events documented.

## 2021-03-04 NOTE — Assessment & Plan Note (Signed)
Likely due to esophageal mass seen on EGD.  Now resolved

## 2021-03-04 NOTE — Assessment & Plan Note (Addendum)
Not able to take oral medication due to dysphagia.  Can resume his medication through gastrostomy tube

## 2021-03-04 NOTE — Anesthesia Procedure Notes (Signed)
Procedure Name: Intubation Date/Time: 03/04/2021 10:14 AM Performed by: Loree Fee, CRNA Pre-anesthesia Checklist: Patient identified, Emergency Drugs available, Suction available, Patient being monitored and Timeout performed Patient Re-evaluated:Patient Re-evaluated prior to induction Oxygen Delivery Method: Circle system utilized Preoxygenation: Pre-oxygenation with 100% oxygen Induction Type: IV induction Laryngoscope Size: McGraph and 4 Grade View: Grade I Tube type: Oral Endobronchial tube: Right Tube size: 7.5 mm Number of attempts: 1 Airway Equipment and Method: Stylet and Video-laryngoscopy Placement Confirmation: ETT inserted through vocal cords under direct vision, positive ETCO2, CO2 detector and breath sounds checked- equal and bilateral Secured at: 24 cm Tube secured with: Tape Dental Injury: Teeth and Oropharynx as per pre-operative assessment

## 2021-03-04 NOTE — Assessment & Plan Note (Addendum)
Worrisome for cancer.Gastrostomy tube placed by surgery today.  Tube feeds started.  He will likely need port placement as well.  Further cancer care as an outpatient including PET scan.  Oncology Dr. Janese Banks following

## 2021-03-04 NOTE — Op Note (Signed)
Preoperative diagnosis: Obstructive esophageal cancer.   Postoperative diagnosis: same.   Procedure: Stamm gastrostomy.  Anesthesia: GETA  Surgeon: Dr. Windell Moment, MD  Wound Classification: Clean Contaminated  Indications: Patient is a 70 y.o. male required prolonged enteral nutrition because of obstructive esophageal mass. Stamm gastrostomy was chosen as the route of nutritional support.   Findings: 1. Gastrostomy 22 Fr in place 2. Normal gastric anatomy 3. Adequate hemostasis  Description of procedure: The patient was placed in the supine position and general endotracheal anesthesia was induced. A time-out was completed verifying correct patient, procedure, site, positioning, and implant(s) and/or special equipment prior to beginning this procedure. Preoperative antibiotics were given. The abdomen was prepped and draped in the usual sterile fashion. A short upper midline incision was made and deepened through the subcutaneous tissues with electrocautery. Hemostasis was assured. The linea alba was incised and the peritoneal cavity entered.  The stomach was identified and a location on the anterior wall near the greater curvature was selected. That site was approximated to the chosen exit site and found to reach without tension. A small incision was made and the 22-French Gastrostome was passed through the anterior abdominal wall and into the field.  A purse-string suture of 2-0 silk was placed on the anterior surface of the stomach and a gastrotomy was made with electrocautery in the center of the suture. The catheter was inserted into the lumen of the stomach. The purse-string suture was secured in place in such a manner as to inkwell the stomach around the catheter. A second, outer concentric pursestring was placed in a similar manner and tied to further inkwell the stomach.  The stomach was then tacked to the anterior abdominal wall at the catheter entrance site with several with the same  silk sutures used for the purse-string in such a manner as to prevent leakage or torsion. The catheter was secured to the skin with 2-0 Prolene.  Hemostasis was checked and omentum was brought adjacent to the surgical field. The fascia was closed with a running suture of Vicryl 0.  The skin was closed with subcuticular sutures of Monocryl 4-0.   The patient tolerated the procedure well and was taken to the postanesthesia care unit in stable condition  Specimen: None  Complications: None  EBL: 5 mL

## 2021-03-04 NOTE — Assessment & Plan Note (Signed)
Continue IV hydration and monitor.

## 2021-03-04 NOTE — Assessment & Plan Note (Signed)
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  Oncology following

## 2021-03-04 NOTE — Progress Notes (Signed)
  Progress Note    Alexander Tran   XFG:[id number]  DOB: June 21, 1950  DOA: 03/03/2021     1 Date of Service: 03/04/2021   Clinical Course  70 y.o. male with medical history significant for HTN, DM, CAD who presents with a several week history of nausea, vomiting, not improving with Protonix given by his PCP.  He also reports a 40 pound weight loss, unintentional.  Patient was sent from the urgent care where he was treated tonight with Zofran but without improvement.  He also had dysphagia for last 5 weeks with inability to even drink liquids  10/28: EGD shows completely obstructed distal esophagus mass with food stuck in his esophagus.  Multiple biopsies taken by [name]. oncology and surgery consult  10/29: Gastrostomy tube in place.  Tube feeding started.         Assessment and Plan * Esophageal mass Worrisome for cancer.Gastrostomy tube placed by surgery today.  Tube feeds started.  He will likely need port placement as well.  Further cancer care as an outpatient including PET scan.  Oncology Dr. 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  Oncology following  AKI (acute kidney injury) ([place]) Continue IV hydration and monitor.  Coronary artery disease involving native coronary artery of native heart without angina pectoris Not able to take oral medication due to dysphagia.  Can resume his medication through gastrostomy tube  Intractable nausea and vomiting Likely due to esophageal mass seen on EGD.  Now resolved  Diabetes mellitus without complication ([place]) Continue sliding scale for now.     Subjective:  Overwhelmed with information and possible new diagnosis of cancer.  Agreeable with management plan including port if he needs   Objective Vitals:   03/04/21 1145 03/04/21 1200 03/04/21 1215 03/04/21 1300  BP: 123/62 126/65 128/62 (!) 143/76  Pulse: 63 62 60 62  Resp: 11 17 13 15   Temp:  (!) 97.4 F (36.3 C)  98.2 F (36.8 C)  TempSrc:     Oral  SpO2: 99% 100% 100% 100%  Weight:      Height:       82.6 kg  Vital signs were reviewed and unremarkable.   Exam Physical Exam   Constitutional:Alert and oriented x 3. Not in any apparent distress HEENT: Head:Normocephalic and atraumatic.  Eyes:PERLA, EOMI, Conjunctivae are normal. Sclera is non-icteric.  Mouth/Throat:Mucous membranes are moist.  Neck:Supple with no signs of meningismus. Cardiovascular:Regular rate and rhythm. No murmurs, gallops, or rubs. 2+ symmetrical distal pulses are present . No JVD. No LE edema Respiratory:Respiratory effort normal .Lungs sounds clear bilaterally. No wheezes, crackles, or rhonchi.  Gastrointestinal:Soft, non tender, and non distended with positive bowel sounds.  Gastrostomy tube in place Genitourinary:No CVA tenderness. Musculoskeletal:Nontender with normal range of motion in all extremities. No cyanosis, or erythema of extremities. Neurologic: Face is symmetric. Moving all extremities. No gross focal neurologic deficits . Skin:Skin is warm, dry. No rash or ulcers Psychiatric: Mood and affect are normal  Labs / Other Information My review of labs, imaging, notes and other tests shows no new significant findings.    Disposition Plan: Status is: Inpatient  Remains inpatient appropriate because: Received PEG tube today will likely need port and to make sure he tolerates tube feed.  Possible discharge on Tuesday     Time spent: 35 minutes [facility] 03/04/2021, 1:46 PM

## 2021-03-04 NOTE — Anesthesia Postprocedure Evaluation (Signed)
Anesthesia Post Note  Patient: Alexander Tran  Procedure(s) Performed: INSERTION OF GASTROSTOMY TUBE  Patient location during evaluation: PACU Anesthesia Type: General Level of consciousness: awake and alert Pain management: pain level controlled Vital Signs Assessment: post-procedure vital signs reviewed and stable Respiratory status: spontaneous breathing, nonlabored ventilation, respiratory function stable and patient connected to nasal cannula oxygen Cardiovascular status: blood pressure returned to baseline and stable Postop Assessment: no apparent nausea or vomiting Anesthetic complications: no   No notable events documented.   Last Vitals:  Vitals:   03/04/21 1129 03/04/21 1130  BP: (!) 124/59   Pulse: 71   Resp: 18   Temp:  (!) 36.2 C  SpO2:      Last Pain:  Vitals:   03/04/21 1130  TempSrc:   PainSc: 2                  Larey Dresser

## 2021-03-05 ENCOUNTER — Encounter: Payer: Self-pay | Admitting: General Surgery

## 2021-03-05 DIAGNOSIS — N179 Acute kidney failure, unspecified: Secondary | ICD-10-CM | POA: Diagnosis not present

## 2021-03-05 DIAGNOSIS — E860 Dehydration: Secondary | ICD-10-CM | POA: Diagnosis not present

## 2021-03-05 DIAGNOSIS — K2289 Other specified disease of esophagus: Secondary | ICD-10-CM | POA: Diagnosis not present

## 2021-03-05 DIAGNOSIS — I2510 Atherosclerotic heart disease of native coronary artery without angina pectoris: Secondary | ICD-10-CM | POA: Diagnosis not present

## 2021-03-05 LAB — GLUCOSE, CAPILLARY
Glucose-Capillary: 142 mg/dL — ABNORMAL HIGH (ref 70–99)
Glucose-Capillary: 144 mg/dL — ABNORMAL HIGH (ref 70–99)
Glucose-Capillary: 154 mg/dL — ABNORMAL HIGH (ref 70–99)
Glucose-Capillary: 154 mg/dL — ABNORMAL HIGH (ref 70–99)
Glucose-Capillary: 162 mg/dL — ABNORMAL HIGH (ref 70–99)
Glucose-Capillary: 164 mg/dL — ABNORMAL HIGH (ref 70–99)
Glucose-Capillary: 188 mg/dL — ABNORMAL HIGH (ref 70–99)

## 2021-03-05 LAB — CEA: CEA: 12 ng/mL — ABNORMAL HIGH (ref 0.0–4.7)

## 2021-03-05 NOTE — Assessment & Plan Note (Addendum)
Resolved with hydration 

## 2021-03-05 NOTE — Progress Notes (Signed)
Patient ID: Alexander Tran, male   DOB: 1950/07/28, 70 y.o.   MRN: [id number]     [facility] Day(s): 2.   Interval History: Patient seen and examined, no acute events or new complaints overnight. Patient reports feeling well.  He denies any issue with the gastrostomy.  He has been tolerating feeding tube well.  He denies any pain.  Vital signs in last 24 hours: [min-max] current  Temp:  [97.4 F (36.3 C)-99.4 F (37.4 C)] 98.7 F (37.1 C) (10/30 0725) Pulse Rate:  [60-67] 66 (10/30 0725) Resp:  [11-18] 12 (10/30 0725) BP: (90-143)/(45-76) 111/45 (10/30 0725) SpO2:  [96 %-100 %] 96 % (10/30 0725)     Height: 6\' 5"  (195.6 cm) Weight: 82.6 kg BMI (Calculated): 21.58   Physical Exam:  Constitutional: alert, cooperative and no distress  Respiratory: breathing non-labored at rest  Cardiovascular: regular rate and sinus rhythm  Gastrointestinal: soft, non-tender, and non-distended.  Gastrostomy in place.  Labs:  CBC Latest Ref Rng & Units 03/04/2021 03/03/2021 03/02/2021  WBC 4.0 - 10.5 K/uL 6.2 8.0 9.4  Hemoglobin 13.0 - 17.0 g/dL 11.9(L) 13.6 14.7  Hematocrit 39.0 - 52.0 % 33.9(L) 37.2(L) 40.0  Platelets 150 - 400 K/uL 188 213 264   CMP Latest Ref Rng & Units 03/04/2021 03/03/2021 03/02/2021  Glucose 70 - 99 mg/dL 88 - 143(H)  BUN 8 - 23 mg/dL 35(H) - 43(H)  Creatinine 0.61 - 1.24 mg/dL 1.05 1.55(H) 1.64(H)  Sodium 135 - 145 mmol/L 137 - 136  Potassium 3.5 - 5.1 mmol/L 4.3 - 4.7  Chloride 98 - 111 mmol/L 108 - 96(L)  CO2 22 - 32 mmol/L 26 - 27  Calcium 8.9 - 10.3 mg/dL 9.1 - 10.3  Total Protein 6.5 - 8.1 g/dL - - 8.0  Total Bilirubin 0.3 - 1.2 mg/dL - - 1.4(H)  Alkaline Phos 38 - 126 U/L - - 66  AST 15 - 41 U/L - - 13(L)  ALT 0 - 44 U/L - - 13    Imaging studies: No new pertinent imaging studies   Assessment/Plan:  70 y.o. male with obstructing esophageal mass 1 Day Post-Op s/p gastrostomy placement, complicated by pertinent comorbidities including  malnutrition, coronary artery disease.  Patient today evaluated him doing well after gastrostomy placement.  Patient has been tolerating enteral feedings.  This morning enteral feeding without 50 mL/h.  He should be able to be advanced to goal of 60 m/h today.  I was asked by medical oncology to consider doing Port-A-Cath placement during this admission.  I discussed with patient about the port placement procedure.  He understands he understand and agree to have it placed.  Looking at the OR schedule tomorrow seems to be pretty full but I will discuss with our charge nurse tomorrow morning to see if I can fit him in schedule.  This should not interfere with his discharge planning.  If patient is ready to be discharged tomorrow and I can put the port tomorrow he should be able to go after the port.  If not this can be placed as an outpatient procedure.  I will reevaluate tomorrow morning after discussion with our charge nurse to see if there is any OR availability.  Arnold Long, MD

## 2021-03-05 NOTE — Assessment & Plan Note (Signed)
Continue sliding scale for now

## 2021-03-05 NOTE — Assessment & Plan Note (Signed)
Not able to take oral medication due to dysphagia.  Can resume his medication through gastrostomy tube.

## 2021-03-05 NOTE — Assessment & Plan Note (Signed)
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  Oncology following.

## 2021-03-05 NOTE — Progress Notes (Signed)
Patient is asking for drinks and ice chips. Dr Windell Moment that anything the patient gets should be going through G-tube

## 2021-03-05 NOTE — Progress Notes (Signed)
  Progress Note    Alexander Tran   HDQ:[id number]  DOB: 22-Mar-1951  DOA: 03/03/2021     2 Date of Service: 03/05/2021   Clinical Course  70 y.o. male with medical history significant for HTN, DM, CAD who presents with a several week history of nausea, vomiting, not improving with Protonix given by his PCP.  He also reports a 40 pound weight loss, unintentional.  Patient was sent from the urgent care where he was treated tonight with Zofran but without improvement.  He also had dysphagia for last 5 weeks with inability to even drink liquids  10/28: EGD shows completely obstructed distal esophagus mass with food stuck in his esophagus.  Multiple biopsies taken by [name]. oncology and surgery consult  10/29: Gastrostomy tube in place.  Tube feeding started.   10/30: Tolerating tube feeds, possible port placement tomorrow       Assessment and Plan * Esophageal mass Worrisome for cancer.Gastrostomy tube placed by surgery today.  Tube feeds started.  He will likely need port placement possibly tomorrow.  Further cancer care as an outpatient including PET scan.  Oncology Dr. 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  Oncology following.  AKI (acute kidney injury) ([place]) Resolved with hydration  Coronary artery disease involving native coronary artery of native heart without angina pectoris Not able to take oral medication due to dysphagia.  Can resume his medication through gastrostomy tube.  Intractable nausea and vomiting Likely due to esophageal mass seen on EGD.  Now resolved.  Hypertension Well-controlled for now.  Diabetes mellitus without complication (HCC) Continue sliding scale for now     Subjective:   Tolerating tube feeds.  No new issues.  Agreeable for port placement  Objective Vitals:   03/04/21 1636 03/04/21 1934 03/05/21 0414 03/05/21 0725  BP: 119/63 (!) 90/46 116/61 (!) 111/45  Pulse: 63 67 66 66  Resp: 18 16 16 12   Temp:  98.4 F (36.9 C) 99.4 F (37.4 C) 99.1 F (37.3 C) 98.7 F (37.1 C)  TempSrc: Oral Oral Oral Oral  SpO2: 98% 96% 97% 96%  Weight:      Height:       82.6 kg  Vital signs were reviewed and unremarkable.   Exam Physical Exam   Constitutional:Alert and oriented x 3. Not in any apparent distress HEENT: Head:Normocephalic and atraumatic.  Eyes:PERLA, EOMI, Conjunctivae are normal. Sclera is non-icteric.  Mouth/Throat:Mucous membranes are moist.  Neck:Supple with no signs of meningismus. Cardiovascular:Regular rate and rhythm. No murmurs, gallops, or rubs. 2+ symmetrical distal pulses are present . No JVD. No LE edema Respiratory:Respiratory effort normal .Lungs sounds clear bilaterally. No wheezes, crackles, or rhonchi.  Gastrointestinal:Soft, non tender, and non distended with positive bowel sounds.  Gastrostomy tube in place Genitourinary:No CVA tenderness. Musculoskeletal:Nontender with normal range of motion in all extremities. No cyanosis, or erythema of extremities. Neurologic: Face is symmetric. Moving all extremities. No gross focal neurologic deficits . Skin:Skin is warm, dry. No rash or ulcers Psychiatric: Mood and affect are normal  Labs / Other Information My review of labs, imaging, notes and other tests shows no new significant findings.    Disposition Plan: Status is: Inpatient  Remains inpatient appropriate because: Port-A-Cath placement tomorrow depending on OR schedule.  Likely discharge on Tuesday        Time spent: 35 minutes [facility] 03/05/2021, 11:50 AM

## 2021-03-05 NOTE — Assessment & Plan Note (Signed)
Likely due to esophageal mass seen on EGD.  Now resolved.

## 2021-03-05 NOTE — Assessment & Plan Note (Signed)
Well-controlled for now. 

## 2021-03-05 NOTE — Assessment & Plan Note (Signed)
Worrisome for cancer.Gastrostomy tube placed by surgery today.  Tube feeds started.  He will likely need port placement possibly tomorrow.  Further cancer care as an outpatient including PET scan.  Oncology Dr. Janese Banks following

## 2021-03-05 NOTE — Plan of Care (Signed)
  Problem: Health Behavior/Discharge Planning: Goal: Ability to manage health-related needs will improve Outcome: Progressing   Problem: Activity: Goal: Risk for activity intolerance will decrease Outcome: Progressing   Problem: Nutrition: Goal: Adequate nutrition will be maintained Outcome: Progressing   Problem: Coping: Goal: Level of anxiety will decrease Outcome: Progressing   Problem: Pain Managment: Goal: General experience of comfort will improve Outcome: Progressing   Problem: Safety: Goal: Ability to remain free from injury will improve Outcome: Progressing   

## 2021-03-06 ENCOUNTER — Inpatient Hospital Stay: Payer: Medicare Other | Admitting: Anesthesiology

## 2021-03-06 ENCOUNTER — Inpatient Hospital Stay: Payer: Medicare Other

## 2021-03-06 ENCOUNTER — Other Ambulatory Visit: Payer: Self-pay

## 2021-03-06 ENCOUNTER — Encounter: Payer: Self-pay | Admitting: Gastroenterology

## 2021-03-06 ENCOUNTER — Encounter
Admission: EM | Disposition: A | Discharge status: Home / Self Care | Payer: Self-pay | Source: Home / Self Care | Attending: Internal Medicine

## 2021-03-06 DIAGNOSIS — E440 Moderate protein-calorie malnutrition: Secondary | ICD-10-CM | POA: Insufficient documentation

## 2021-03-06 DIAGNOSIS — N179 Acute kidney failure, unspecified: Secondary | ICD-10-CM | POA: Diagnosis not present

## 2021-03-06 DIAGNOSIS — E860 Dehydration: Secondary | ICD-10-CM | POA: Diagnosis not present

## 2021-03-06 DIAGNOSIS — C159 Malignant neoplasm of esophagus, unspecified: Secondary | ICD-10-CM

## 2021-03-06 DIAGNOSIS — I2510 Atherosclerotic heart disease of native coronary artery without angina pectoris: Secondary | ICD-10-CM | POA: Diagnosis not present

## 2021-03-06 HISTORY — PX: PORTACATH PLACEMENT: SHX2246

## 2021-03-06 LAB — GLUCOSE, CAPILLARY
Glucose-Capillary: 101 mg/dL — ABNORMAL HIGH (ref 70–99)
Glucose-Capillary: 102 mg/dL — ABNORMAL HIGH (ref 70–99)
Glucose-Capillary: 107 mg/dL — ABNORMAL HIGH (ref 70–99)
Glucose-Capillary: 109 mg/dL — ABNORMAL HIGH (ref 70–99)
Glucose-Capillary: 116 mg/dL — ABNORMAL HIGH (ref 70–99)
Glucose-Capillary: 122 mg/dL — ABNORMAL HIGH (ref 70–99)
Glucose-Capillary: 164 mg/dL — ABNORMAL HIGH (ref 70–99)

## 2021-03-06 LAB — SURGICAL PATHOLOGY

## 2021-03-06 SURGERY — INSERTION, TUNNELED CENTRAL VENOUS DEVICE, WITH PORT
Anesthesia: General | Site: Chest | Laterality: Right

## 2021-03-06 MED ORDER — HEPARIN SODIUM (PORCINE) 5000 UNIT/ML IJ SOLN
INTRAMUSCULAR | Status: AC
  Filled 2021-03-06: qty 1

## 2021-03-06 MED ORDER — CEFAZOLIN SODIUM-DEXTROSE 2-3 GM-%(50ML) IV SOLR
INTRAVENOUS | Status: DC | PRN
  Administered 2021-03-06: 2 g via INTRAVENOUS

## 2021-03-06 MED ORDER — SODIUM CHLORIDE 0.9 % IV SOLN
INTRAVENOUS | Status: DC | PRN
  Administered 2021-03-06: 3 mL via INTRAMUSCULAR

## 2021-03-06 MED ORDER — FENTANYL CITRATE (PF) 100 MCG/2ML IJ SOLN: 25.0000 ug | INTRAMUSCULAR | Status: DC | PRN

## 2021-03-06 MED ORDER — MIDAZOLAM HCL 2 MG/2ML IJ SOLN
INTRAMUSCULAR | Status: DC | PRN
  Administered 2021-03-06 (×2): 1 mg via INTRAVENOUS

## 2021-03-06 MED ORDER — PROPOFOL 500 MG/50ML IV EMUL
INTRAVENOUS | Status: DC | PRN
  Administered 2021-03-06: 75 ug/kg/min via INTRAVENOUS

## 2021-03-06 MED ORDER — PROPOFOL 10 MG/ML IV BOLUS
INTRAVENOUS | Status: AC
  Filled 2021-03-06: qty 60

## 2021-03-06 MED ORDER — FREE WATER
190.0000 mL | Freq: Four times a day (QID) | Status: DC
  Administered 2021-03-07 (×2): 190 mL

## 2021-03-06 MED ORDER — ONDANSETRON HCL 4 MG/2ML IJ SOLN
INTRAMUSCULAR | Status: DC | PRN
  Administered 2021-03-06: 4 mg via INTRAVENOUS

## 2021-03-06 MED ORDER — MIDAZOLAM HCL 2 MG/2ML IJ SOLN
INTRAMUSCULAR | Status: AC
  Filled 2021-03-06: qty 2

## 2021-03-06 MED ORDER — PROPOFOL 10 MG/ML IV BOLUS
INTRAVENOUS | Status: DC | PRN
  Administered 2021-03-06 (×2): 30 mg via INTRAVENOUS

## 2021-03-06 MED ORDER — ONDANSETRON HCL 4 MG/2ML IJ SOLN: 4.0000 mg | Freq: Once | INTRAMUSCULAR | Status: DC | PRN

## 2021-03-06 MED ORDER — ACETAMINOPHEN 10 MG/ML IV SOLN: 1000.0000 mg | Freq: Once | INTRAVENOUS | Status: DC | PRN

## 2021-03-06 MED ORDER — SODIUM CHLORIDE (PF) 0.9 % IJ SOLN
INTRAMUSCULAR | Status: DC | PRN
  Administered 2021-03-06: 500 mL

## 2021-03-06 MED ORDER — ESMOLOL HCL 100 MG/10ML IV SOLN
INTRAVENOUS | Status: DC | PRN
  Administered 2021-03-06: 30 mg via INTRAVENOUS

## 2021-03-06 MED ORDER — FENTANYL CITRATE (PF) 100 MCG/2ML IJ SOLN
INTRAMUSCULAR | Status: AC
  Filled 2021-03-06: qty 2

## 2021-03-06 MED ORDER — SODIUM CHLORIDE 0.9 % IV SOLN
INTRAVENOUS | Status: DC | PRN
Start: 2021-03-06 — End: 2021-03-06

## 2021-03-06 MED ORDER — BUPIVACAINE-EPINEPHRINE (PF) 0.25% -1:200000 IJ SOLN
INTRAMUSCULAR | Status: AC
  Filled 2021-03-06: qty 30

## 2021-03-06 MED ORDER — OSMOLITE 1.5 CAL PO LIQD
474.0000 mL | Freq: Three times a day (TID) | ORAL | Status: DC
  Administered 2021-03-07 (×2): 474 mL
  Filled 2021-03-06 (×5): qty 474

## 2021-03-06 MED ORDER — OSMOLITE 1.5 CAL PO LIQD: 474.0000 mL | Freq: Three times a day (TID) | ORAL | 0 refills | Status: DC

## 2021-03-06 MED ORDER — OSMOLITE 1.5 CAL PO LIQD
237.0000 mL | Freq: Every day | ORAL | Status: DC
  Administered 2021-03-06: 237 mL
  Filled 2021-03-06 (×2): qty 237

## 2021-03-06 MED ORDER — OXYCODONE HCL 5 MG/5ML PO SOLN: 5.0000 mg | Freq: Once | ORAL | Status: DC | PRN

## 2021-03-06 MED ORDER — OXYCODONE HCL 5 MG PO TABS: 5.0000 mg | ORAL_TABLET | Freq: Once | ORAL | Status: DC | PRN

## 2021-03-06 MED ORDER — LACTATED RINGERS IV SOLN: INTRAVENOUS | Status: DC

## 2021-03-06 MED ORDER — SODIUM CHLORIDE (PF) 0.9 % IJ SOLN
INTRAMUSCULAR | Status: AC
  Filled 2021-03-06: qty 50

## 2021-03-06 MED ORDER — BUPIVACAINE-EPINEPHRINE (PF) 0.25% -1:200000 IJ SOLN
INTRAMUSCULAR | Status: DC | PRN
  Administered 2021-03-06: 13 mL

## 2021-03-06 SURGICAL SUPPLY — 34 items
BAG DECANTER FOR FLEXI CONT (MISCELLANEOUS) ×2 IMPLANT
BLADE SURG 11 STRL SS SAFETY (MISCELLANEOUS) ×2 IMPLANT
BLADE SURG SZ11 CARB STEEL (BLADE) ×2 IMPLANT
BOOT SUTURE AID YELLOW STND (SUTURE) ×2 IMPLANT
CHLORAPREP W/TINT 26 (MISCELLANEOUS) ×2 IMPLANT
COVER LIGHT HANDLE STERIS (MISCELLANEOUS) ×4 IMPLANT
DERMABOND ADVANCED (GAUZE/BANDAGES/DRESSINGS) ×1
DERMABOND ADVANCED .7 DNX12 (GAUZE/BANDAGES/DRESSINGS) ×1 IMPLANT
DRAPE C-ARM XRAY 36X54 (DRAPES) ×2 IMPLANT
ELECT REM PT RETURN 9FT ADLT (ELECTROSURGICAL) ×2
ELECTRODE REM PT RTRN 9FT ADLT (ELECTROSURGICAL) ×1 IMPLANT
GAUZE 4X4 16PLY ~~LOC~~+RFID DBL (SPONGE) ×2 IMPLANT
GLOVE SURG ENC MOIS LTX SZ6.5 (GLOVE) ×2 IMPLANT
GLOVE SURG UNDER POLY LF SZ6.5 (GLOVE) ×2 IMPLANT
GOWN STRL REUS W/ TWL LRG LVL3 (GOWN DISPOSABLE) ×3 IMPLANT
GOWN STRL REUS W/TWL LRG LVL3 (GOWN DISPOSABLE) ×3
IV NS 500ML (IV SOLUTION) ×1
IV NS 500ML BAXH (IV SOLUTION) ×1 IMPLANT
KIT PORT POWER 8FR ISP CVUE (Port) ×2 IMPLANT
KIT TURNOVER KIT A (KITS) ×2 IMPLANT
LABEL OR SOLS (LABEL) ×2 IMPLANT
MANIFOLD NEPTUNE II (INSTRUMENTS) ×2 IMPLANT
NEEDLE FILTER BLUNT 18X 1/2SAF (NEEDLE) ×1
NEEDLE FILTER BLUNT 18X1 1/2 (NEEDLE) ×1 IMPLANT
PACK PORT-A-CATH (MISCELLANEOUS) ×2 IMPLANT
SUT MNCRL AB 4-0 PS2 18 (SUTURE) ×2 IMPLANT
SUT PROLENE 2 0 FS (SUTURE) ×2 IMPLANT
SUT VIC AB 2-0 SH 27 (SUTURE) ×1
SUT VIC AB 2-0 SH 27XBRD (SUTURE) ×1 IMPLANT
SUT VIC AB 3-0 SH 27 (SUTURE) ×1
SUT VIC AB 3-0 SH 27X BRD (SUTURE) ×1 IMPLANT
SYR 10ML LL (SYRINGE) ×2 IMPLANT
SYR 3ML LL SCALE MARK (SYRINGE) ×2 IMPLANT
WATER STERILE IRR 500ML POUR (IV SOLUTION) ×2 IMPLANT

## 2021-03-06 NOTE — Op Note (Signed)
SURGICAL PROCEDURE REPORT  DATE OF PROCEDURE: 03/06/2021   SURGEON: Dr. 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   POST-OPERATIVE DIAGNOSIS: Same  PROCEDURE(S):  1.) Percutaneous access of Right internal jugular vein under ultrasound guidance 2.) Insertion of tunneled Right internal jugular central venous catheter with subcutaneous port  INTRAOPERATIVE FINDINGS: Patent easily compressible Right internal jugular vein with appropriate respiratory variations and well-secured tunneled central venous catheter with subcutaneous port at completion of the procedure  ESTIMATED BLOOD LOSS: Minimal (<20 mL)   SPECIMENS: None   IMPLANTS: 42F tunneled Bard PowerPort central venous catheter with subcutaneous port  DRAINS: None   COMPLICATIONS: None apparent   CONDITION AT COMPLETION: Hemodynamically stable, awake   DISPOSITION: PACU   INDICATION(S) FOR PROCEDURE:  Patient is a 70 y.o. male who presented with advanced esophageal cancer requiring durable central venous access for chemotherapy. All risks, benefits, and alternatives to above elective procedures were discussed with the patient, who elected to proceed, and informed consent was accordingly obtained at that time.  DETAILS OF PROCEDURE:  Patient was brought to the operative suite and appropriately identified. In Trendelenburg position, Right IJ venous access site was prepped and draped in the usual sterile fashion, and following a brief timeout, percutaneous Right IJ venous access was obtained under ultrasound guidance using Seldinger technique, by which local anesthetic was injected over the Right IJ vein, and access needle was inserted under direct ultrasound visualization into the Right IJ vein, through which soft guidewire was advanced, over which access needle was withdrawn. Guidewire was secured,  attention was directed to injection of local anesthetic along the planned tunnel site, 2-3 cm transverse right chest incision was made and confirmed to accommodate the subcutaneous port, and flushed catheter was tunneled retrograde from the port site over the Right chest to the Right IJ access site with the attached port well-secured to the catheter and within the subcutaneous pocket. Insertion sheath was advanced over the guidewire, which was withdrawn along with the insertion sheath dilator. The catheter was introduced through the sheath and left on the [place] junction under fluoro guidance and catheter cut to desire lenght. Catheter connected to port and fixed to the pocket on two side to avoid twisting. Port was confirmed to withdraw blood and flush easily, after which concentrated heparin was instilled into the port and catheter. Dermis at the subcutaneous pocket was re-approximated using buried interrupted 3-0 Vicryl suture, and 4-0 Monocryl suture was used to re-approximate skin at the insertion and subcutaneous port sites in running subcuticular fashion for the subcutaneous port and buried interrupted fashion for the insertion site. Skin was cleaned, dried, and sterile skin glue was applied. Patient was then safely transferred to PACU for a chest x-ray. Ultrasound images are available on paper chart and Fluoroscopy guidance images are available in Epic.

## 2021-03-06 NOTE — Transfer of Care (Signed)
Immediate Anesthesia Transfer of Care Note  Patient: Alexander Tran  Procedure(s) Performed: INSERTION PORT-A-CATH (Right: Chest)  Patient Location: PACU  Anesthesia Type:General  Level of Consciousness: awake, drowsy and patient cooperative  Airway & Oxygen Therapy: Patient Spontanous Breathing  Post-op Assessment: Report given to RN, Post -op Vital signs reviewed and stable and Patient moving all extremities  Post vital signs: Reviewed and stable  Last Vitals:  Vitals Value Taken Time  BP 139/78 03/06/21 1606  Temp    Pulse 83 03/06/21 1610  Resp 14 03/06/21 1610  SpO2 98 % 03/06/21 1610  Vitals shown include unvalidated device data.  Last Pain:  Vitals:   03/06/21 1412  TempSrc: Temporal  PainSc: 0-No pain      Patients Stated Pain Goal: 0 (39/53/20 2334)  Complications: No notable events documented.

## 2021-03-06 NOTE — Assessment & Plan Note (Signed)
Continue sliding scale for now.

## 2021-03-06 NOTE — TOC CM/SW Note (Addendum)
Per RN in rounds meeting, patient with new peg tube. Sent referral information to [employer] representative for home supplies. Per MD, likely discharge tomorrow. [employer] representative said to discharge him with 3 days worth of formula and supplies. Sent secure chat to RN and dietician to notify.  Dayton Scrape, [place]

## 2021-03-06 NOTE — Assessment & Plan Note (Signed)
S/p EGD esophageal mass which is completely obstructing.  Status post PEG placement.  Now on tube feeds

## 2021-03-06 NOTE — Progress Notes (Signed)
Patient ID: Alexander Tran, male   DOB: 1951/04/07, 70 y.o.   MRN: [id number]     [facility] Hospital Day(s): 3.   Interval History: Patient seen and examined, no acute events or new complaints overnight. Patient reports doing well.  He denies any abdominal pain.  He denies any chest pain.  He denies any nausea or vomiting.  He is tolerating enteral feedings.  Vital signs in last 24 hours: [min-max] current  Temp:  [98.3 F (36.8 C)-99.1 F (37.3 C)] 98.3 F (36.8 C) (10/31 0449) Pulse Rate:  [59-70] 59 (10/31 0449) Resp:  [12-20] 16 (10/31 0449) BP: (106-150)/(54-63) 150/63 (10/31 0449) SpO2:  [94 %-96 %] 96 % (10/31 0449)     Height: 6\' 5"  (195.6 cm) Weight: 82.6 kg BMI (Calculated): 21.58   Physical Exam:  Constitutional: alert, cooperative and no distress  Respiratory: breathing non-labored at rest  Cardiovascular: regular rate and sinus rhythm  Gastrointestinal: soft, non-tender, and non-distended.  Gastrostomy in place  Labs:  CBC Latest Ref Rng & Units 03/04/2021 03/03/2021 03/02/2021  WBC 4.0 - 10.5 K/uL 6.2 8.0 9.4  Hemoglobin 13.0 - 17.0 g/dL 11.9(L) 13.6 14.7  Hematocrit 39.0 - 52.0 % 33.9(L) 37.2(L) 40.0  Platelets 150 - 400 K/uL 188 213 264   CMP Latest Ref Rng & Units 03/04/2021 03/03/2021 03/02/2021  Glucose 70 - 99 mg/dL 88 - 143(H)  BUN 8 - 23 mg/dL 35(H) - 43(H)  Creatinine 0.61 - 1.24 mg/dL 1.05 1.55(H) 1.64(H)  Sodium 135 - 145 mmol/L 137 - 136  Potassium 3.5 - 5.1 mmol/L 4.3 - 4.7  Chloride 98 - 111 mmol/L 108 - 96(L)  CO2 22 - 32 mmol/L 26 - 27  Calcium 8.9 - 10.3 mg/dL 9.1 - 10.3  Total Protein 6.5 - 8.1 g/dL - - 8.0  Total Bilirubin 0.3 - 1.2 mg/dL - - 1.4(H)  Alkaline Phos 38 - 126 U/L - - 66  AST 15 - 41 U/L - - 13(L)  ALT 0 - 44 U/L - - 13    Imaging studies: No new pertinent imaging studies   Assessment/Plan:  70 y.o. male with obstructing esophageal mass 2 Day Post-Op s/p gastrostomy placement, complicated by pertinent  comorbidities including malnutrition, coronary artery disease.  Patient recovering adequately from gastrostomy placement.  Patient is tolerating diet.  Medical oncology discussed with patient about the need of chemotherapy.  I was asked to place Chemo-Port.  I discussed with the patient about the benefit and risk of the procedure of insertion of Port-A-Cath.  He understand the risk of infection, bleeding, pneumothorax, arteriovenous fistula, among others.  Patient endorses he understood and agreed to proceed.  Arnold Long, MD

## 2021-03-06 NOTE — Assessment & Plan Note (Signed)
Resolved with hydration 

## 2021-03-06 NOTE — Assessment & Plan Note (Signed)
Biopsy confirmed moderately differentiated adenocarcinoma.s/p PEG placement and now getting port placement today.  Patient tolerating tube feeds thus far further cancer care as an outpatient including PET scan.  Oncology Dr. Janese Banks following

## 2021-03-06 NOTE — Anesthesia Preprocedure Evaluation (Addendum)
Anesthesia Evaluation  Patient identified by MRN, date of birth, ID band Patient awake    Reviewed: Allergy & Precautions, NPO status , Patient's Chart, lab work & pertinent test results, reviewed documented beta blocker date and time   History of Anesthesia Complications Negative for: history of anesthetic complications  Airway Mallampati: III  TM Distance: >3 FB Neck ROM: Full    Dental  (+) Poor Dentition,    Pulmonary Current Smoker and Patient abstained from smoking.,    Pulmonary exam normal breath sounds clear to auscultation- rhonchi (-) wheezing      Cardiovascular hypertension, Pt. on medications and Pt. on home beta blockers + CAD and + Past MI  Normal cardiovascular exam Rhythm:Regular Rate:Normal - Systolic murmurs and - Diastolic murmurs    Neuro/Psych PSYCHIATRIC DISORDERS Bipolar Disorder negative neurological ROS     GI/Hepatic Neg liver ROS, GERD  , Obstructing esophageal tumor c/f esophageal CA; s/p EGD 03/03/21 w/ food boluses & tumor found; now s/p GT insertion on 03/04/21.  He had dysphagia for last 5 weeks with inability to even drink liquids.   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[place])   Reproductive/Obstetrics                          Anesthesia Physical  Anesthesia Plan  ASA: 3  Anesthesia Plan: General   Post-op Pain Management:    Induction: Intravenous  PONV Risk Score and Plan: 1 and Propofol infusion, TIVA  and Treatment may vary due to age or medical condition  Airway Management Planned: Natural Airway  Additional Equipment:   Intra-op Plan:   Post-operative Plan:   Informed Consent: I have reviewed the patients History and Physical, chart, labs and discussed the procedure including the risks, benefits and alternatives for the proposed anesthesia with the patient or authorized representative who has indicated his/her understanding and acceptance.       Plan Discussed with: CRNA  Anesthesia Plan Comments: (LMA/GETA backup.)     Anesthesia Quick Evaluation

## 2021-03-06 NOTE — Care Management Important Message (Signed)
Important Message  Patient Details  Name: Alexander Tran MRN: [id number] Date of Birth: 1951-04-03   Medicare Important Message Given:  Yes     Dannette Barbara 03/06/2021, 1:20 PM

## 2021-03-06 NOTE — Progress Notes (Signed)
Progress Note    Alexander Tran   LAG:[id number]  DOB: 1950/10/06  DOA: 03/03/2021     3 Date of Service: 03/06/2021   Clinical Course  70 y.o. male with medical history significant for HTN, DM, CAD who presents with a several week history of nausea, vomiting, not improving with Protonix given by his PCP.  He also reports a 40 pound weight loss, unintentional.  Patient was sent from the urgent care where he was treated tonight with Zofran but without improvement.  He also had dysphagia for last 5 weeks with inability to even drink liquids  10/28: EGD shows completely obstructed distal esophagus mass with food stuck in his esophagus.  Multiple biopsies taken by [name]. oncology and surgery consult  10/29: Gastrostomy tube in place.  Tube feeding started.   10/30: Tolerating tube feeds, possible port placement tomorrow 10/31: Biopsy positive for Moderately differentiated adenocarcinoma.  Port placement today       Assessment and Plan * Esophageal cancer ([place]) New diagnosis.  Biopsy from endoscopy resulted today showing Moderately differentiated adenocarcinoma  Esophageal mass Biopsy confirmed moderately differentiated adenocarcinoma.s/p PEG placement and now getting port placement today.  Patient tolerating tube feeds thus far further cancer care as an outpatient including PET scan.  Oncology Dr. Janese Banks following  AKI (acute kidney injury) [facility]) Resolved with hydration.  Coronary artery disease involving native coronary artery of native heart without angina pectoris Change oral medications to be given via tube feeds  Esophageal stricture S/p EGD esophageal mass which is completely obstructing.  Status post PEG placement.  Now on tube feeds  Intractable nausea and vomiting Likely due to esophageal mass seen on EGD.  Now resolved  Hypertension Well-controlled for now  Diabetes mellitus without complication ([place]) Continue sliding scale for now.     Subjective:  Agreeable  to get port placement  Objective Vitals:   03/05/21 1602 03/05/21 1949 03/06/21 0449 03/06/21 0859  BP: (!) 119/57 (!) 106/55 (!) 150/63 138/84  Pulse: 65 70 (!) 59 60  Resp: 12 20 16 16   Temp: 98.6 F (37 C) 99.1 F (37.3 C) 98.3 F (36.8 C) 98.8 F (37.1 C)  TempSrc: Oral Oral Oral Oral  SpO2: 96% 94% 96% 95%  Weight:      Height:       82.6 kg  Vital signs were reviewed and unremarkable.   Exam Physical Exam   Constitutional:Alert and oriented x 3. Not in any apparent distress HEENT: Head:Normocephalic and atraumatic.  Eyes:PERLA, EOMI, Conjunctivae are normal. Sclera is non-icteric.  Mouth/Throat:Mucous membranes are moist.  Neck:Supple with no signs of meningismus. Cardiovascular:Regular rate and rhythm. No murmurs, gallops, or rubs. 2+ symmetrical distal pulses are present . No JVD. No LE edema Respiratory:Respiratory effort normal .Lungs sounds clear bilaterally. No wheezes, crackles, or rhonchi.  Gastrointestinal:Soft, non tender, and non distended with positive bowel sounds. Gastrostomy tube in place.  PEG tube in place and tube feeds running Genitourinary:No CVA tenderness. Musculoskeletal:Nontender with normal range of motion in all extremities. No cyanosis, or erythema of extremities. Neurologic: Face is symmetric. Moving all extremities. No gross focal neurologic deficits . Skin:Skin is warm, dry. No rash or ulcers Psychiatric: Mood and affect are normal  Labs / Other Information My review of labs, imaging, notes and other tests is significant for Esophageal biopsy confirmed cancer     Disposition Plan: Status is: Inpatient  Remains inpatient appropriate because: Getting port placement today.  Plan discharge for tomorrow  Time spent: 35 minutes [facility] 03/06/2021, 1:32 PM

## 2021-03-06 NOTE — Assessment & Plan Note (Signed)
New diagnosis.  Biopsy from endoscopy resulted today showing Moderately differentiated adenocarcinoma

## 2021-03-06 NOTE — Assessment & Plan Note (Signed)
Well-controlled for now. 

## 2021-03-06 NOTE — Anesthesia Postprocedure Evaluation (Signed)
Anesthesia Post Note  Patient: Camillia Herter  Procedure(s) Performed: INSERTION PORT-A-CATH (Right: Chest)  Patient location during evaluation: PACU Anesthesia Type: General Level of consciousness: awake and alert Pain management: pain level controlled Vital Signs Assessment: post-procedure vital signs reviewed and stable Respiratory status: spontaneous breathing, nonlabored ventilation, respiratory function stable and patient connected to nasal cannula oxygen Cardiovascular status: blood pressure returned to baseline and stable Postop Assessment: no apparent nausea or vomiting Anesthetic complications: no   No notable events documented.   Last Vitals:  Vitals:   03/06/21 0859 03/06/21 1412  BP: 138/84 (!) 152/74  Pulse: 60 (!) 57  Resp: 16 16  Temp: 37.1 C 36.7 C  SpO2: 95% 98%    Last Pain:  Vitals:   03/06/21 1412  TempSrc: Temporal  PainSc: 0-No pain                 Molli Barrows

## 2021-03-06 NOTE — Assessment & Plan Note (Signed)
Likely due to esophageal mass seen on EGD.  Now resolved

## 2021-03-06 NOTE — Progress Notes (Signed)
Nutrition Follow-up  DOCUMENTATION CODES:   Non-severe (moderate) malnutrition in context of chronic illness  INTERVENTION:   Initiate bolus feedings:   2 ARC cartons (474 ml) Osmolite 1.5 TID via PEG   1 ARC carton (237 ml) Osmolite 1.5 daily q HS via PEG  95 ml free water flush before and after each feeding administration  Tube feeding regimen provides 2485 kcal (100% of needs), 104 grams of protein, and 1267 ml of H2O. 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  Ongoing  GOAL:   Patient will meet greater than or equal to 90% of their needs  Progressing   MONITOR:   Diet advancement, Labs, Weight trends, TF tolerance, Skin, I & O's  REASON FOR ASSESSMENT:   Malnutrition Screening Tool, Consult Enteral/tube feeding initiation and management  ASSESSMENT:   70 y.o. male with medical history significant for HTN, DM, CAD who presents with a several week history of nausea, vomiting, difficulty swallowing. Upper endoscopy shows some vague distal esophageal mass and food boluses. Scope unable to pass the mass. Pt with large fungating mass of the distal esophagus. Currently on work-up and planning of care for treatment of esophageal cancer.  10/29- g-tube pacement, TF initiated  Reviewed I/O's: +921 ml x 24 hours and +5.2 L since admission  UOP: 400 ml x 24 hours  Pt currently NPO and TF held secondary to port placement today.  Spoke with pt and pt son at bedside. Pt reports minimal oral intake over the past 5 weeks. Pt shares that anything he tried to eat would come "right back up" and reports MD found calcified foods particles surrounding the esophageal mass. Plan to start chemo and radiation. Pt is looking forward to when he can eat PO, but understands that he will need to receive  his nutrition via PEG for the time being.   Pt shares that he was tolerating TF well. He denies any nausea, vomiting, or abdominal pain. He shares that he feels full, but feels strange that he does not feel the other senses that come with eating (taste, smell, texture, etc). RD provided emotional support.   Per pt, he experienced an intentional 100-150# wt loss over the past year and a 30# unintentional wt loss over the past month. Per CareEverywhere, pt has experienced a 16.8% wt loss over the past 6 months, which is significant for time frame.   Discussed with pt transitioning to bolus feeds prior to discharge, which will allow him ore freedom and mobility, as he will not be hooked to the feeding pump. He is agreeable and shares that his son will help support him. Son verbalized agreement to help him.   Case discussed with RN and TOC team. A 3 day supply of TF will be provided to pt room prior to discharge (expected discharge tomorrow, 03/07/21) per request of [facility].   Also forwarded note to [facility] RDs.   Medications reviewed and include 0.9% sodium chloride infusion @ 100 ml/hr.   Labs reviewed: CBGS: [id number] (inpatient orders for glycemic control are 0-9 units insulin aspart every 4 hours).    NUTRITION - FOCUSED PHYSICAL EXAM:  Flowsheet Row Most Recent Value  Orbital Region Moderate depletion  Upper Arm Region Mild depletion  Thoracic and Lumbar Region No depletion  Buccal Region Mild depletion  Temple Region Mild  depletion  Clavicle Bone Region Moderate depletion  Clavicle and Acromion Bone Region Moderate depletion  Scapular Bone Region Moderate depletion  Dorsal Hand Moderate depletion  Patellar Region Moderate depletion  Anterior Thigh Region Moderate depletion  Posterior Calf Region Moderate depletion  Edema (RD Assessment) None  Hair Reviewed  Eyes Reviewed  Mouth Reviewed  Skin Reviewed  Nails Reviewed       Diet Order:   Diet Order              Diet NPO time specified  Diet effective midnight                   EDUCATION NEEDS:   Education needs have been addressed  Skin:  Skin Assessment: Skin Integrity Issues: Skin Integrity Issues:: Incisions Incisions: abdomen  Last BM:  Unknown  Height:   Ht Readings from Last 1 Encounters:  03/02/21 6\' 5"  (1.956 m)    Weight:   Wt Readings from Last 1 Encounters:  03/02/21 82.6 kg    Ideal Body Weight:  94.5 kg  BMI:  Body mass index is 21.58 kg/m.  Estimated Nutritional Needs:   Kcal:  9735-3299  Protein:  115-130 grams  Fluid:  >/= 2 L/day    Loistine Chance, RD, LDN, [place] Registered Dietitian II Certified Diabetes Care and Education Specialist Please refer to [facility] for RD and/or RD on-call/weekend/after hours pager

## 2021-03-06 NOTE — Assessment & Plan Note (Signed)
Change oral medications to be given via tube feeds

## 2021-03-07 ENCOUNTER — Telehealth: Payer: Self-pay | Admitting: Oncology

## 2021-03-07 DIAGNOSIS — I2510 Atherosclerotic heart disease of native coronary artery without angina pectoris: Secondary | ICD-10-CM | POA: Diagnosis not present

## 2021-03-07 DIAGNOSIS — E860 Dehydration: Secondary | ICD-10-CM | POA: Diagnosis not present

## 2021-03-07 DIAGNOSIS — C159 Malignant neoplasm of esophagus, unspecified: Secondary | ICD-10-CM | POA: Diagnosis not present

## 2021-03-07 DIAGNOSIS — N179 Acute kidney failure, unspecified: Secondary | ICD-10-CM | POA: Diagnosis not present

## 2021-03-07 LAB — GLUCOSE, CAPILLARY
Glucose-Capillary: 106 mg/dL — ABNORMAL HIGH (ref 70–99)
Glucose-Capillary: 253 mg/dL — ABNORMAL HIGH (ref 70–99)

## 2021-03-07 NOTE — Telephone Encounter (Signed)
Spoke with patient about scheduled PET scan and MD f/u. Patient was agreeable to day/time and expressed understanding of instructions.

## 2021-03-08 ENCOUNTER — Telehealth: Payer: Self-pay | Admitting: *Deleted

## 2021-03-08 NOTE — Telephone Encounter (Signed)
Patient called reporting that he was discharged yesterday and that  he had a power port inserted. He states the the area where the port is inserted is swollen and is raising up and asks what to do about it. The port was inserted Monday by Dr Windell Moment. Please advise

## 2021-03-09 NOTE — Discharge Summary (Signed)
Physician Discharge Summary   Patient name: Alexander Tran  Admit date:     03/03/2021  Discharge date: 03/07/2021  Discharge Physician: Max Sane   PCP: Cherrie Distance, MD   Recommendations at discharge: f/up PCP, Onco and other specialists as requested.   Discharge Diagnoses Principal Problem:   Esophageal cancer ([place]) Active Problems:   Diabetes mellitus without complication (HCC)   Hypertension   Intractable nausea and vomiting   Esophageal stricture   Coronary artery disease involving native coronary artery of native heart without angina pectoris   AKI (acute kidney injury) (HCC)   Impaired swallowing   Esophageal mass   Dehydration   Malnutrition of moderate degree   Resolved Diagnoses Resolved Problems:   Neoplasm of digestive system   Hospital Course   70 y.o. male with medical history significant for HTN, DM, CAD who presents with a several week history of nausea, vomiting, not improving with Protonix given by his PCP.  He also reports a 40 pound weight loss, unintentional.  Patient was sent from the urgent care where he was treated tonight with Zofran but without improvement.  He also had dysphagia for last 5 weeks with inability to even drink liquids  10/28: EGD shows completely obstructed distal esophagus mass with food stuck in his esophagus.  Multiple biopsies taken by [name]. oncology and surgery consult  10/29: Gastrostomy tube in place.  Tube feeding started.   10/30: Tolerating tube feeds, possible port placement tomorrow 10/31: Biopsy positive for Moderately differentiated adenocarcinoma.  Port placement today    * Esophageal cancer [facility]) New diagnosis.  Biopsy from endoscopy showing Moderately differentiated adenocarcinoma. Further work up including PET scan and mgmt as an outpt with cancer center - Dr Elroy Channel office is coordinating all.  - s/p PEG and port placement Dr Windell Moment   AKI (acute kidney injury) [facility]) Resolved with hydration.  Prerenal.   Coronary artery disease involving native coronary artery of native heart without angina pectoris  Resume home meds at D/C   Esophageal stricture S/p EGD esophageal mass which is completely obstructing.  Status post PEG placement.  Now on tube feeds   Intractable nausea and vomiting due to esophageal mass seen on EGD.  Now resolved   Hypertension Well-controlled for now   Diabetes mellitus without complication [facility])    Procedures performed: Port placement in right IJ on 10/31, PEG on 10/29, EGD 10/28  Condition at discharge: good  Exam Physical Exam   Constitutional: Alert and oriented x 3 . Not in any apparent distress HEENT:      Head: Normocephalic and atraumatic.         Eyes: PERLA, EOMI, Conjunctivae are normal. Sclera is non-icteric.       Mouth/Throat: Mucous membranes are moist.       Neck: Supple with no signs of meningismus. Cardiovascular: Regular rate and rhythm. No murmurs, gallops, or rubs. 2+ symmetrical distal pulses are present . No JVD. No LE edema. Port in place right upper chest Respiratory: Respiratory effort normal .Lungs sounds clear bilaterally. No wheezes, crackles, or rhonchi.  Gastrointestinal: Soft, non tender, and non distended with positive bowel sounds.  Gastrostomy tube in place.  PEG tube in place and tube feeds running Genitourinary: No CVA tenderness. Musculoskeletal: Nontender with normal range of motion in all extremities. No cyanosis, or erythema of extremities. Neurologic:  Face is symmetric. Moving all extremities. No gross focal neurologic deficits . Skin: Skin is warm, dry.  No rash or ulcers Psychiatric: Mood  and affect are normal   Disposition: Home  Discharge time: greater than 30 minutes.  Follow-up Information     Cherrie Distance, MD. Go on 03/10/2021.   Specialty: Family Medicine Why: 9am appointment Contact information: [address] [place] [address] [phone]                  Allergies as of 03/07/2021       Reactions   Iodine    Other Other (See Comments)   Uncoded Allergy. Allergen: bandaid adhesive, Other Reaction: Contac Dermatitis        Medication List     STOP taking these medications    insulin glargine 100 UNIT/ML Solostar Pen Commonly known as: Lantus SoloStar       TAKE these medications    aspirin 81 MG EC tablet Take 1 tablet (81 mg total) by mouth daily. What changed: Another medication with the same name was removed. Continue taking this medication, and follow the directions you see here.   atorvastatin 40 MG tablet Commonly known as: LIPITOR Take 1 tablet (40 mg total) by mouth daily at 6 PM.   blood glucose meter kit and supplies Kit Dispense based on patient and insurance preference. Use up to four times daily as directed. (FOR ICD-9 250.00, 250.01).   feeding supplement (OSMOLITE 1.5 CAL) Liqd Place 474 mLs into feeding tube with breakfast, with lunch, and with evening meal.   Insulin Pen Needle 31G X 4 MM Misc 1 pen by Does not apply route daily. Use with each dose and as directed   levothyroxine 100 MCG tablet Commonly known as: SYNTHROID Take 1 tablet (100 mcg total) by mouth every morning.   lisinopril 2.5 MG tablet Commonly known as: ZESTRIL Take 2.5 mg by mouth daily. What changed: Another medication with the same name was removed. Continue taking this medication, and follow the directions you see here.   metFORMIN 1000 MG tablet Commonly known as: GLUCOPHAGE Take 1 tablet (1,000 mg total) by mouth 2 (two) times daily with a meal. What changed: when to take this   metoprolol tartrate 25 MG tablet Commonly known as: LOPRESSOR Take 1 tablet (25 mg total) by mouth 2 (two) times daily.   oxybutynin 10 MG 24 hr tablet Commonly known as: DITROPAN-XL Take 10 mg by mouth daily.   pantoprazole 40 MG tablet Commonly known as: PROTONIX Take 40 mg by mouth daily.        CT Abdomen Pelvis  Wo Contrast  Result Date: 03/03/2021 CLINICAL DATA:  Nausea vomiting. EXAM: CT ABDOMEN AND PELVIS WITHOUT CONTRAST TECHNIQUE: Multidetector CT imaging of the abdomen and pelvis was performed following the standard protocol without IV contrast. COMPARISON:  None. FINDINGS: Evaluation of this exam is limited in the absence of intravenous contrast. Lower chest: The visualized lung bases are clear. There is coronary vascular calcification. No intra-abdominal free air or free fluid. Hepatobiliary: No focal liver abnormality is seen. No gallstones, gallbladder wall thickening, or biliary dilatation. Pancreas: Unremarkable. No pancreatic ductal dilatation or surrounding inflammatory changes. Spleen: Normal in size without focal abnormality. Adrenals/Urinary Tract: The adrenal glands are unremarkable. Kidneys, visualized ureters, and urinary bladder are unremarkable. Stomach/Bowel: Mildly dilated distal esophagus with fluid content. The stomach is nondistended. Underlying mass or stricture of the gastroesophageal junction is not excluded. Further evaluation with esophagram or endoscopy on a nonemergent/outpatient basis recommended. There is moderate stool throughout the colon. There is sigmoid diverticulosis without active inflammatory changes. There is  no bowel obstruction or active inflammation. The appendix is normal. Vascular/Lymphatic: Advanced aortoiliac atherosclerotic disease. The IVC is unremarkable. No portal venous gas. There is no adenopathy. Reproductive: The prostate and seminal vesicles are grossly unremarkable. No pelvic mass. Other: None Musculoskeletal: Osteopenia with degenerative changes of the spine. No acute osseous pathology. IMPRESSION: 1. Mildly dilated distal esophagus with fluid content. Underlying mass or stricture of the gastroesophageal junction is not excluded. Further evaluation with esophagram or endoscopy on a nonemergent/outpatient basis recommended. 2. Sigmoid diverticulosis. No bowel  obstruction. Normal appendix. 3. Aortic Atherosclerosis (ICD10-I70.0). Electronically Signed   By: Anner Crete M.D.   On: 03/03/2021 03:17   DG Chest 2 View  Result Date: 03/02/2021 CLINICAL DATA:  Reflux, weight loss EXAM: CHEST - 2 VIEW COMPARISON:  04/04/2018 FINDINGS: Heart and mediastinal contours are within normal limits. No focal opacities or effusions. No acute bony abnormality. IMPRESSION: No active cardiopulmonary disease. Electronically Signed   By: Rolm Baptise M.D.   On: 03/02/2021 19:21   DG Chest Port 1 View  Result Date: 03/06/2021 CLINICAL DATA:  Port-A-Cath placement EXAM: PORTABLE CHEST 1 VIEW COMPARISON:  03/02/2021 FINDINGS: Interval placement of right Port-A-Cath with the tip at the cavoatrial junction. Heart is normal size. Bibasilar atelectasis. There is lucency noted at the right base and over the visualized upper abdomen. This could reflect sub pulmonic right pneumothorax or pneumoperitoneum. IMPRESSION: Lucency at the right lung base and over the upper abdomen. This could reflect sub pulmonic pneumothorax or pneumoperitoneum. Consider decubitus views (left side down) of the chest and abdomen for further evaluation. These results will be called to the ordering clinician or representative by the Radiologist Assistant, and communication documented in the PACS or [employer]. Electronically Signed   By: Rolm Baptise M.D.   On: 03/06/2021 17:42   DG C-Arm 1-60 Min-No Report  Result Date: 03/06/2021 Fluoroscopy was utilized by the requesting physician.  No radiographic interpretation.   Results for orders placed or performed during the hospital encounter of 03/03/21  Resp Panel by RT-PCR (Flu A&B, Covid) Nasopharyngeal Swab     Status: None   Collection Time: 03/03/21  3:44 AM   Specimen: Nasopharyngeal Swab; Nasopharyngeal(NP) swabs in vial transport medium  Result Value Ref Range Status   SARS Coronavirus 2 by RT PCR NEGATIVE NEGATIVE Final    Comment:  (NOTE) SARS-CoV-2 target nucleic acids are NOT DETECTED.  The SARS-CoV-2 RNA is generally detectable in upper respiratory specimens during the acute phase of infection. The lowest concentration of SARS-CoV-2 viral copies this assay can detect is 138 copies/mL. A negative result does not preclude SARS-Cov-2 infection and should not be used as the sole basis for treatment or other patient management decisions. A negative result may occur with  improper specimen collection/handling, submission of specimen other than nasopharyngeal swab, presence of viral mutation(s) within the areas targeted by this assay, and inadequate number of viral copies(<138 copies/mL). A negative result must be combined with clinical observations, patient history, and epidemiological information. The expected result is Negative.  Fact Sheet for Patients:  EntrepreneurPulse.com.au  Fact Sheet for Healthcare Providers:  IncredibleEmployment.be  This test is no t yet approved or cleared by the [place] FDA and  has been authorized for detection and/or diagnosis of SARS-CoV-2 by FDA under an Emergency Use Authorization (EUA). This EUA will remain  in effect (meaning this test can be used) for the duration of the COVID-19 declaration under Section 564(b)(1) of the Act, 21 U.S.C.section 360bbb-3(b)(1), unless the  authorization is terminated  or revoked sooner.       Influenza A by PCR NEGATIVE NEGATIVE Final   Influenza B by PCR NEGATIVE NEGATIVE Final    Comment: (NOTE) The Xpert Xpress SARS-CoV-2/FLU/RSV plus assay is intended as an aid in the diagnosis of influenza from Nasopharyngeal swab specimens and should not be used as a sole basis for treatment. Nasal washings and aspirates are unacceptable for Xpert Xpress SARS-CoV-2/FLU/RSV testing.  Fact Sheet for Patients: EntrepreneurPulse.com.au  Fact Sheet for Healthcare  Providers: IncredibleEmployment.be  This test is not yet approved or cleared by the [place] FDA and has been authorized for detection and/or diagnosis of SARS-CoV-2 by FDA under an Emergency Use Authorization (EUA). This EUA will remain in effect (meaning this test can be used) for the duration of the COVID-19 declaration under Section 564(b)(1) of the Act, 21 U.S.C. section 360bbb-3(b)(1), unless the authorization is terminated or revoked.  Performed at [facility], [address], [place], [place] [address]     Signed:  Max Sane MD.  [facility] 03/09/2021, 2:35 PM

## 2021-03-09 NOTE — Telephone Encounter (Signed)
I Called the pt. And he got son on phone and he does not see redness. He is not sure if it is jsut the port because he lost a lot of wt. They are both unsure if it good or not. I told them that if it is swollen then he can put a wash cloth over the site and have ice pack on it 10- 15 min. And do  it up to 3 times a day. He can also come over and we can look at it. He will try to get son to bring him over in am. He does not want to get out of car. I left message with Gabriel Cirri that she would be on the look out for him and call me and I will come look at it

## 2021-03-13 ENCOUNTER — Telehealth: Payer: Self-pay | Admitting: *Deleted

## 2021-03-13 ENCOUNTER — Telehealth: Payer: Self-pay

## 2021-03-13 NOTE — Telephone Encounter (Signed)
Patient called to report that he has run out of Osmolite for his tube feeding and has been unable to locate any locally. His son ordered it on line but that order will not be filled for a week. Can we supply him?

## 2021-03-13 NOTE — Telephone Encounter (Signed)
Nutrition  RD does have enteral supplies and case of osmolite 1.5 that patient can pick up.   RD called representative Zach with [facility] regarding shipment of enteral supplies.  Thedore Mins was going to look into it and get back with RD.  Noted on 10/31 Case Management contacted [facility] to provide enteral supplies to patient at discharge.  Patient was sent home with 3 days supplies of formula prior to discharge.    Case of osmolite 1.5 and supplies will be at registration desk of cancer center for patient to pick up.   RD called patient and no answer, multiple times this am.  No option to leave voicemail as mailbox full. RD called son, Broadus John and let him know that formula and supplies are located at the cancer center registration desk for pick up.  Broadus John said that he could pick up these supplies between 12-1pm today.    Shamieka Gullo B. Zenia Resides, [place], [place] Registered Dietitian [phone] (mobile)

## 2021-03-15 ENCOUNTER — Ambulatory Visit
Admission: RE | Admit: 2021-03-15 | Discharge: 2021-03-15 | Disposition: A | Discharge status: Home / Self Care | Payer: Medicare Other | Source: Ambulatory Visit | Attending: Oncology | Admitting: Oncology

## 2021-03-15 ENCOUNTER — Telehealth: Payer: Self-pay | Admitting: *Deleted

## 2021-03-15 ENCOUNTER — Other Ambulatory Visit: Payer: Self-pay

## 2021-03-15 DIAGNOSIS — C159 Malignant neoplasm of esophagus, unspecified: Secondary | ICD-10-CM | POA: Diagnosis not present

## 2021-03-15 DIAGNOSIS — I3139 Other pericardial effusion (noninflammatory): Secondary | ICD-10-CM | POA: Diagnosis not present

## 2021-03-15 DIAGNOSIS — I700 Atherosclerosis of aorta: Secondary | ICD-10-CM | POA: Insufficient documentation

## 2021-03-15 DIAGNOSIS — R911 Solitary pulmonary nodule: Secondary | ICD-10-CM | POA: Insufficient documentation

## 2021-03-15 DIAGNOSIS — J320 Chronic maxillary sinusitis: Secondary | ICD-10-CM | POA: Insufficient documentation

## 2021-03-15 DIAGNOSIS — M47816 Spondylosis without myelopathy or radiculopathy, lumbar region: Secondary | ICD-10-CM | POA: Diagnosis not present

## 2021-03-15 DIAGNOSIS — J432 Centrilobular emphysema: Secondary | ICD-10-CM | POA: Diagnosis not present

## 2021-03-15 DIAGNOSIS — M5136 Other intervertebral disc degeneration, lumbar region: Secondary | ICD-10-CM | POA: Diagnosis not present

## 2021-03-15 DIAGNOSIS — K5730 Diverticulosis of large intestine without perforation or abscess without bleeding: Secondary | ICD-10-CM | POA: Insufficient documentation

## 2021-03-15 DIAGNOSIS — I2510 Atherosclerotic heart disease of native coronary artery without angina pectoris: Secondary | ICD-10-CM | POA: Diagnosis not present

## 2021-03-15 DIAGNOSIS — J341 Cyst and mucocele of nose and nasal sinus: Secondary | ICD-10-CM | POA: Diagnosis not present

## 2021-03-15 LAB — GLUCOSE, CAPILLARY: Glucose-Capillary: 169 mg/dL — ABNORMAL HIGH (ref 70–99)

## 2021-03-15 MED ORDER — FLUDEOXYGLUCOSE F - 18 (FDG) INJECTION
10.3800 | Freq: Once | INTRAVENOUS | Status: AC | PRN
  Administered 2021-03-15: 10.38 via INTRAVENOUS

## 2021-03-15 NOTE — Telephone Encounter (Signed)
His mass was completely obstructing and GI had recommended not to eat or drink through his mouth. He could try soup but I am concerned it is not going to pass and he will land up throwing it up

## 2021-03-15 NOTE — Telephone Encounter (Signed)
Patient called reporting that he has gained 8 lbs in 8 days on the feeding tube and that he has not had any food or water orally in 8 weeks. He is asking if he can eat some soup by mouth. Please advise

## 2021-03-15 NOTE — Telephone Encounter (Signed)
Call returned to patient and I explained what Dr Janese Banks had said and told him that if he does decide to try soup that he only try a small amt to start with and see how he does with that and not to try to eat an entire bowl of soup. I also advised that he not eat soup with any chucks only to try the liquid soups. He agreed to this

## 2021-03-16 ENCOUNTER — Other Ambulatory Visit: Payer: Self-pay | Admitting: General Surgery

## 2021-03-16 ENCOUNTER — Telehealth: Payer: Self-pay

## 2021-03-16 NOTE — Telephone Encounter (Signed)
Referral sent to [facility] thoracic surgery, Dr. Elenor Quinones.

## 2021-03-17 ENCOUNTER — Other Ambulatory Visit: Payer: Self-pay

## 2021-03-17 ENCOUNTER — Other Ambulatory Visit: Payer: Self-pay | Admitting: General Surgery

## 2021-03-17 ENCOUNTER — Ambulatory Visit
Admission: RE | Admit: 2021-03-17 | Discharge: 2021-03-17 | Disposition: A | Discharge status: Home / Self Care | Payer: Medicare Other | Source: Ambulatory Visit | Attending: General Surgery | Admitting: General Surgery

## 2021-03-17 ENCOUNTER — Inpatient Hospital Stay (HOSPITAL_BASED_OUTPATIENT_CLINIC_OR_DEPARTMENT_OTHER): Payer: Medicare Other | Admitting: Oncology

## 2021-03-17 VITALS — BP 121/89 | HR 91 | Temp 96.9°F | Resp 18 | Wt 196.7 lb

## 2021-03-17 DIAGNOSIS — C155 Malignant neoplasm of lower third of esophagus: Secondary | ICD-10-CM | POA: Diagnosis not present

## 2021-03-17 DIAGNOSIS — K9423 Gastrostomy malfunction: Secondary | ICD-10-CM | POA: Diagnosis not present

## 2021-03-17 DIAGNOSIS — Z5111 Encounter for antineoplastic chemotherapy: Secondary | ICD-10-CM | POA: Insufficient documentation

## 2021-03-17 DIAGNOSIS — C159 Malignant neoplasm of esophagus, unspecified: Secondary | ICD-10-CM

## 2021-03-17 DIAGNOSIS — Z79899 Other long term (current) drug therapy: Secondary | ICD-10-CM | POA: Insufficient documentation

## 2021-03-17 DIAGNOSIS — Z7189 Other specified counseling: Secondary | ICD-10-CM | POA: Diagnosis not present

## 2021-03-17 DIAGNOSIS — Z510 Encounter for antineoplastic radiation therapy: Secondary | ICD-10-CM | POA: Diagnosis present

## 2021-03-17 HISTORY — PX: IR CM INJ ANY COLONIC TUBE W/FLUORO: IMG2336

## 2021-03-17 MED ORDER — IOHEXOL 350 MG/ML SOLN
30.0000 mL | Freq: Once | INTRAVENOUS | Status: AC | PRN
  Administered 2021-03-17: 30 mL
  Filled 2021-03-17: qty 30

## 2021-03-17 MED ORDER — SENNOSIDES 8.8 MG/5ML PO SYRP: 5.0000 mL | ORAL_SOLUTION | Freq: Every day | ORAL | 1 refills | Status: DC

## 2021-03-17 NOTE — Progress Notes (Signed)
Pt is having a hard time having a BM states it "takes him an hour to pass 3 inches of a stool, feels like he is pushing a rocks through a garden hose."

## 2021-03-17 NOTE — Progress Notes (Signed)
Referral placed to radiation oncology. They can see him on Monday at 1030. Appointment details reviewed with Alexander Tran.  I have contacted his pharmacy regarding his medications. Some of his medications do come in liquid or cruahable form. Contacted his PCP office, Dr. Alinda Sierras, regarding concern that he is not taking any of his prescribed medications except Metformin per Alexander Tran. Alexander Tran did have a hospital follow scheduled that he had to cancel due to multiple other appointments. They will get him rescheduled. I have also spoke to Alexander Tran and asked him to call and get this appointment arranged. He states he will call them now.

## 2021-03-20 ENCOUNTER — Ambulatory Visit
Admission: RE | Admit: 2021-03-20 | Discharge: 2021-03-20 | Disposition: A | Discharge status: Home / Self Care | Payer: Medicare Other | Source: Ambulatory Visit | Attending: Radiation Oncology | Admitting: Radiation Oncology

## 2021-03-20 ENCOUNTER — Encounter: Payer: Self-pay | Admitting: Oncology

## 2021-03-20 ENCOUNTER — Other Ambulatory Visit: Payer: Self-pay

## 2021-03-20 VITALS — BP 129/74 | HR 98 | Temp 97.7°F | Resp 16 | Wt 192.3 lb

## 2021-03-20 DIAGNOSIS — Z7189 Other specified counseling: Secondary | ICD-10-CM | POA: Insufficient documentation

## 2021-03-20 DIAGNOSIS — C155 Malignant neoplasm of lower third of esophagus: Secondary | ICD-10-CM

## 2021-03-20 MED ORDER — DEXAMETHASONE 4 MG PO TABS: 8.0000 mg | ORAL_TABLET | Freq: Every day | ORAL | 1 refills | Status: DC

## 2021-03-20 MED ORDER — LORAZEPAM 0.5 MG PO TABS: 0.5000 mg | ORAL_TABLET | Freq: Four times a day (QID) | ORAL | 0 refills | Status: AC | PRN

## 2021-03-20 MED ORDER — LIDOCAINE-PRILOCAINE 2.5-2.5 % EX CREA
TOPICAL_CREAM | CUTANEOUS | 3 refills | Status: AC
Start: 2021-03-20 — End: ?

## 2021-03-20 MED ORDER — PROCHLORPERAZINE MALEATE 10 MG PO TABS: 10.0000 mg | ORAL_TABLET | Freq: Four times a day (QID) | ORAL | 1 refills | Status: AC | PRN

## 2021-03-20 MED ORDER — ONDANSETRON HCL 8 MG PO TABS: 8.0000 mg | ORAL_TABLET | Freq: Two times a day (BID) | ORAL | 1 refills | Status: AC | PRN

## 2021-03-20 NOTE — Progress Notes (Signed)
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  CT abdomen and pelvis without contrast in the ER showed mildly dilated distal esophagus with fluid content.  Underlying mass or stricture cannot be excluded.  There was no locoregional adenopathy or distant metastatic disease noted on CT abdomen.  Patient was seen by Dr. Verl Blalock and underwent EGD today.  Large fungating mass with no bleeding or stigmata of recent bleeding noted at the GE junction 40 cm from the incisors.  Mass was completely obstructing and circumferential.    Biopsy showed moderately differentiated adenocarcinoma.Head CT scan showed distal esophageal activity with an SUV of 4.1 along the proximal stomach and GE junction.  0.6 cm distal paraesophageal lymph node with an SUV of 1.8.  Enlarged right gastric lymph node close proximity to the tumor 1.2 cm with an SUV of 2.5.  There was a large pneumoperitoneum in the upper abdomen which was also evaluated by general surgery Dr. Peyton Najjar who had put in the PEG tube and subsequent study did not show any evidence of contrast leakage into the peritoneal cavity.  Interval history-patient is urinating to eat but is currently n.p.o. and is getting his  nutritional intake through PEG tube feeds.  He has put on10 pounds in the last 1 week.  Denies any significant abdominal pain1  ECOG PS- 1 Pain scale- 0   Review of systems- Review of Systems  Constitutional:  Negative for chills, fever, malaise/fatigue and weight loss.  HENT:  Negative for congestion, ear discharge and nosebleeds.   Eyes:  Negative for blurred vision.  Respiratory:  Negative for cough, hemoptysis, sputum production, shortness of breath and wheezing.   Cardiovascular:  Negative for chest pain, palpitations, orthopnea and claudication.  Gastrointestinal:  Negative for abdominal pain, blood in stool, constipation, diarrhea, heartburn, melena, nausea and vomiting.  Genitourinary:  Negative for dysuria, flank pain, frequency, hematuria and urgency.  Musculoskeletal:  Negative for back pain, joint pain and myalgias.  Skin:  Negative for rash.  Neurological:  Negative for dizziness, tingling, focal weakness, seizures, weakness and headaches.  Endo/Heme/Allergies:  Does not bruise/bleed easily.  Psychiatric/Behavioral:  Negative for depression and suicidal ideas. The patient does not have insomnia.      Allergies  Allergen Reactions   Iodine    Other Other (See Comments)    Uncoded Allergy. 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[facility])      Past Surgical History:  Procedure Laterality Date   BACK SURGERY     CARDIAC CATHETERIZATION Left 11/02/2015  Procedure: Left Heart Cath and Coronary Angiography;  Surgeon: Isaias Cowman, MD;  Location: [place] [facility];  Service: Cardiovascular;  Laterality: Left;   ESOPHAGOGASTRODUODENOSCOPY (EGD) WITH PROPOFOL N/A 03/03/2021   Procedure: ESOPHAGOGASTRODUODENOSCOPY (EGD)  WITH PROPOFOL;  Surgeon: [name], MD;  Location: [facility] ENDOSCOPY;  Service: Endoscopy;  Laterality: N/A;   GASTROSTOMY N/A 03/04/2021   Procedure: INSERTION OF GASTROSTOMY TUBE;  Surgeon: Herbert Pun, MD;  Location: [facility] ORS;  Service: General;  Laterality: N/A;   IR CM INJ ANY COLONIC TUBE W/FLUORO  03/17/2021   PORTACATH PLACEMENT Right 03/06/2021   Procedure: INSERTION PORT-A-CATH;  Surgeon: Herbert Pun, MD;  Location: [facility] ORS;  Service: General;  Laterality: Right;   SURGERY SCROTAL / TESTICULAR      Social History   Socioeconomic History   Marital status: Married    Spouse name: Not on file   Number of children: Not on file   Years of education: Not on file   Highest education level: Not on file  Occupational History   Occupation: retired Estate manager/land agent   Occupation: works part time now  Tobacco Use   Smoking status: Every Day    Packs/day: 1.00    Types: Cigarettes   Smokeless tobacco: Never  Vaping Use   Vaping Use: Never used  Substance and Sexual Activity   Alcohol use: Yes    Alcohol/week: 21.0 standard drinks    Types: 21 Glasses of wine per week   Drug use: No   Sexual activity: Not on file  Other Topics Concern   Not on file  Social History Narrative   Not on file   Social Determinants of Health   Financial Resource Strain: Not on file  Food Insecurity: Not on file  Transportation Needs: Not on file  Physical Activity: Not on file  Stress: Not on file  Social Connections: Not on file  Intimate Partner Violence: Not on file    Family History  Problem Relation Age of Onset   Coronary artery disease Father      Current Outpatient Medications:    metFORMIN (GLUCOPHAGE) 1000 MG tablet, Take 1 tablet (1,000 mg total) by mouth 2 (two) times daily with a meal. (Patient taking differently: Take 1,000 mg by mouth daily.), Disp: 60 tablet, Rfl: 3   Nutritional Supplements (FEEDING SUPPLEMENT, OSMOLITE 1.5 CAL,) LIQD, Place 474 mLs  into feeding tube with breakfast, with lunch, and with evening meal., Disp: 42660 mL, Rfl: 0   sennosides (SENOKOT) 8.8 MG/5ML syrup, Take 5 mLs by mouth at bedtime., Disp: 236 mL, Rfl: 1   aspirin 81 MG EC tablet, Take 1 tablet (81 mg total) by mouth daily. (Patient not taking: No sig reported), Disp: 30 tablet, Rfl: 3   atorvastatin (LIPITOR) 40 MG tablet, Take 1 tablet (40 mg total) by mouth daily at 6 PM. (Patient not taking: No sig reported), Disp: 30 tablet, Rfl: 3   blood glucose meter kit and supplies KIT, Dispense based on patient and insurance preference. Use up to four times daily as directed. (FOR ICD-9 250.00, 250.01). (Patient not taking: No sig reported), Disp: 1 each, Rfl: 1   Insulin Pen Needle 31G X 4 MM MISC, 1 pen by Does not apply route daily. Use with each dose and as directed (Patient not taking: No sig reported), Disp: 100 each, Rfl: 3   levothyroxine (SYNTHROID, LEVOTHROID) 100 MCG tablet, Take 1 tablet (100 mcg total) by mouth every morning. (Patient not taking: No sig reported), Disp: 30 tablet, Rfl:  3   lisinopril (ZESTRIL) 2.5 MG tablet, Take 2.5 mg by mouth daily. (Patient not taking: No sig reported), Disp: , Rfl:    metoprolol succinate (TOPROL-XL) 25 MG 24 hr tablet, Take 25 mg by mouth daily. (Patient not taking: No sig reported), Disp: , Rfl:    metoprolol tartrate (LOPRESSOR) 25 MG tablet, Take 1 tablet (25 mg total) by mouth 2 (two) times daily. (Patient not taking: No sig reported), Disp: 60 tablet, Rfl: 3   oxybutynin (DITROPAN-XL) 10 MG 24 hr tablet, Take 10 mg by mouth daily. (Patient not taking: No sig reported), Disp: , Rfl:    pantoprazole (PROTONIX) 40 MG tablet, Take 40 mg by mouth daily. (Patient not taking: No sig reported), Disp: , Rfl:   Physical exam:  Vitals:   03/17/21 1416  BP: 121/89  Pulse: 91  Resp: 18  Temp: (!) 96.9 F (36.1 C)  SpO2: 100%  Weight: 196 lb 11.2 oz (89.2 kg)   Physical Exam Constitutional:      General: He is not in  acute distress. Cardiovascular:     Rate and Rhythm: Normal rate and regular rhythm.     Heart sounds: Normal heart sounds.  Pulmonary:     Effort: Pulmonary effort is normal.     Breath sounds: Normal breath sounds.  Abdominal:     General: Bowel sounds are normal.     Palpations: Abdomen is soft.     Comments: PEG tube in place  Skin:    General: Skin is warm and dry.  Neurological:     Mental Status: He is alert and oriented to person, place, and time.     CMP Latest Ref Rng & Units 03/04/2021  Glucose 70 - 99 mg/dL 88  BUN 8 - 23 mg/dL 35(H)  Creatinine 0.61 - 1.24 mg/dL 1.05  Sodium 135 - 145 mmol/L 137  Potassium 3.5 - 5.1 mmol/L 4.3  Chloride 98 - 111 mmol/L 108  CO2 22 - 32 mmol/L 26  Calcium 8.9 - 10.3 mg/dL 9.1  Total Protein 6.5 - 8.1 g/dL -  Total Bilirubin 0.3 - 1.2 mg/dL -  Alkaline Phos 38 - 126 U/L -  AST 15 - 41 U/L -  ALT 0 - 44 U/L -   CBC Latest Ref Rng & Units 03/04/2021  WBC 4.0 - 10.5 K/uL 6.2  Hemoglobin 13.0 - 17.0 g/dL 11.9(L)  Hematocrit 39.0 - 52.0 % 33.9(L)  Platelets 150 - 400 K/uL 188    No images are attached to the encounter.  CT Abdomen Pelvis Wo Contrast  Result Date: 03/03/2021 CLINICAL DATA:  Nausea vomiting. EXAM: CT ABDOMEN AND PELVIS WITHOUT CONTRAST TECHNIQUE: Multidetector CT imaging of the abdomen and pelvis was performed following the standard protocol without IV contrast. COMPARISON:  None. FINDINGS: Evaluation of this exam is limited in the absence of intravenous contrast. Lower chest: The visualized lung bases are clear. There is coronary vascular calcification. No intra-abdominal free air or free fluid. Hepatobiliary: No focal liver abnormality is seen. No gallstones, gallbladder wall thickening, or biliary dilatation. Pancreas: Unremarkable. No pancreatic ductal dilatation or surrounding inflammatory changes. Spleen: Normal in size without focal abnormality. Adrenals/Urinary Tract: The adrenal glands are unremarkable.  Kidneys, visualized ureters, and urinary bladder are unremarkable. Stomach/Bowel: Mildly dilated distal esophagus with fluid content. The stomach is nondistended. Underlying mass or stricture of the gastroesophageal junction is not excluded. Further evaluation with esophagram or endoscopy on a nonemergent/outpatient basis recommended. There is moderate stool throughout the colon. There  is sigmoid diverticulosis without active inflammatory changes. There is no bowel obstruction or active inflammation. The appendix is normal. Vascular/Lymphatic: Advanced aortoiliac atherosclerotic disease. The IVC is unremarkable. No portal venous gas. There is no adenopathy. Reproductive: The prostate and seminal vesicles are grossly unremarkable. No pelvic mass. Other: None Musculoskeletal: Osteopenia with degenerative changes of the spine. No acute osseous pathology. IMPRESSION: 1. Mildly dilated distal esophagus with fluid content. Underlying mass or stricture of the gastroesophageal junction is not excluded. Further evaluation with esophagram or endoscopy on a nonemergent/outpatient basis recommended. 2. Sigmoid diverticulosis. No bowel obstruction. Normal appendix. 3. Aortic Atherosclerosis (ICD10-I70.0). Electronically Signed   By: Anner Crete M.D.   On: 03/03/2021 03:17   DG Chest 2 View  Result Date: 03/02/2021 CLINICAL DATA:  Reflux, weight loss EXAM: CHEST - 2 VIEW COMPARISON:  04/04/2018 FINDINGS: Heart and mediastinal contours are within normal limits. No focal opacities or effusions. No acute bony abnormality. IMPRESSION: No active cardiopulmonary disease. Electronically Signed   By: Rolm Baptise M.D.   On: 03/02/2021 19:21   NM PET Image Initial (PI) Skull Base To Thigh  Addendum Date: 03/16/2021   ADDENDUM REPORT: 03/16/2021 13:16 ADDENDUM: The original report was by Dr. Van Clines. The following addendum is by Dr. Van Clines: Critical Value/emergent results were called by telephone at the  time of interpretation on 03/16/2021 at 12:52 pm to provider Beckey Rutter, NP, who verbally acknowledged these results. I later spoke with Dr. Janese Banks by telephone at 1:13 p.m., she informed me that the patient will be seeing her surgeon today and that information regarding the pneumoperitoneum has been passed on to the surgeon's office. Electronically Signed   By: Van Clines M.D.   On: 03/16/2021 13:16   Result Date: 03/16/2021 CLINICAL DATA:  Initial treatment strategy for esophageal cancer near the gastroesophageal junction. EXAM: NUCLEAR MEDICINE PET SKULL BASE TO THIGH TECHNIQUE: 10.4 mCi F-18 FDG was injected intravenously. Full-ring PET imaging was performed from the skull base to thigh after the radiotracer. CT data was obtained and used for attenuation correction and anatomic localization. Fasting blood glucose: 169 mg/dl COMPARISON:  CT abdomen 03/03/2021 FINDINGS: Mediastinal blood pool activity: SUV max 2.5 Liver activity: SUV max N/A NECK: No significant abnormal hypermetabolic activity in this region. Incidental CT findings: Mild bilateral common carotid atherosclerotic calcification. Small mucous retention cyst in the right maxillary sinus. CHEST: Mildly accentuated metabolic activity at the gastroesophageal junction. Distal esophageal activity with maximum SUV up to 4.1 and activity along the proximal stomach along the gastroesophageal junction with maximum SUV up to 4.5. This corroborates with the location of the patient's tumor. A 0.6 cm distal paraesophageal lymph node on image 140 of series 3 has a maximum SUV 1.8. Incidental CT findings: Coronary, aortic arch, and branch vessel atherosclerotic vascular disease. Right Port-A-Cath tip: SVC. Small anterior pericardial effusion. Centrilobular emphysema. 4 by 3 mm right lower lobe pulmonary nodule on image 118 of series 3, no change from 10/28/2015, considered benign. ABDOMEN/PELVIS: Activity along the gastroesophageal junction as noted in the  chest section above. There is a enlarged right gastric lymph node in fairly close proximity to the tumor, measuring 1.2 cm in short axis on image 164 series 3 (back on 10/28/2015 this lymph node measured 0.7 cm in short axis). This lymph node has a maximum SUV of 2.5. Physiologic activity in bowel. No hepatic hypermetabolic lesions identified. Incidental CT findings: Peg tube noted with the stomach in the vicinity of the tube abutting the anterior abdominal  wall. However, there is a large pneumoperitoneum primarily in the upper abdomen, which would not be normal 12 days out from PEG tube placement. Atherosclerosis is present, including aortoiliac atherosclerotic disease. There are atherosclerotic calcifications in the SMA, celiac trunk, and IMA. No obvious pneumatosis along the bowel. Descending and sigmoid colon diverticulosis. No dilated small bowel. No loculated fluid collections. No obvious bowel wall thickening although today's CT data was obtained without IV contrast. Mild wall thickening in the urinary bladder somewhat eccentric to the right. SKELETON: No significant abnormal hypermetabolic activity in this region. Incidental CT findings: Lumbar spondylosis and degenerative disc disease at L4-5. IMPRESSION: 1. There is a large mount of free intraperitoneal gas mostly in the upper abdomen. A direct cause is not identified. The patient had a PEG tube placed 12 days ago, this is too much gas and too far out from placement to be normal pneumoperitoneum associated with the procedure, raising the possibility of air leak around the tube into the peritoneal cavity. The patient does have substantial atherosclerosis, but I do not see obvious signs of pneumatosis or bowel wall thickening to suggest ischemic bowel with perforation. Careful correlation with lactate levels, patient's symptoms and physical exam, and surgical consultation recommended. If further workup of the vascular system is warranted, abdominopelvic CT  angiography might be considered. 2. Mildly hypermetabolic tumor along the gastroesophageal junction where the maximum SUV is 4.5. There is also an enlarged right gastric lymph node measuring 1.2 cm in short axis with maximum SUV of 2.5. No hypermetabolic liver lesion identified. No distant metastatic lesions observed. 3. Mild wall thickening in the urinary bladder with right eccentricity slightly more apparent than on 03/03/2021. Probably incidental due to low bladder volumes, sessile bladder tumor is a less likely differential diagnostic consideration. If the patient has hematuria then further workup by Urology would be recommended. 4. Other imaging findings of potential clinical significance: Chronic right maxillary sinusitis. Aortic Atherosclerosis (ICD10-I70.0). Coronary and carotid atherosclerosis. Emphysema (ICD10-J43.9). Degenerative disc disease at L4-5. Radiology assistant personnel have been notified to put me in telephone contact with the referring physician or the referring physician's clinical representative in order to discuss these findings. Once this communication is established I will issue an addendum to this report for documentation purposes. Electronically Signed: By: Van Clines M.D. On: 03/16/2021 12:24   IR Cm Inj Any Colonic Tube W/Fluoro  Result Date: 03/17/2021 INDICATION: History of obstructing carcinoma of the GE junction and status post surgical gastrostomy tube placement on 03/06/2021. The patient has been using the tube successfully at home for tube feeds without difficulty or pain. It was noted by PET scan on 03/15/2021 that there was a moderate amount free intraperitoneal air within the abdomen. EXAM: INJECTION OF GASTROSTOMY TUBE UNDER FLUOROSCOPY MEDICATIONS: None ANESTHESIA/SEDATION: None CONTRAST:  30 mL Omnipaque 350 - administered into the gastric lumen. FLUOROSCOPY TIME:  Fluoroscopy Time: 42 seconds.  5.1 mGy. COMPLICATIONS: None immediate. PROCEDURE: The  pre-existing gastrostomy tube was injected with contrast material under fluoroscopy. Real-time fluoroscopy was performed to evaluate contrast flow in multiple projections. FINDINGS: A balloon retention gastrostomy tube is appropriately position within the body of the stomach. Injected contrast material flows preferentially into the proximal stomach with the patient supine. No evidence of contrast leakage into the peritoneal cavity or outside of the patient's body. The patient was rolled up onto his right side and a small amount of contrast did flow towards the distal stomach. A large amount of free intraperitoneal air was not appreciated by  fluoroscopy in the supine position. The patient also was clearly not tender on exam or experiencing any abdominal pain. IMPRESSION: Appropriate position of gastrostomy tube in the body of the stomach without evidence of contrast leakage into the peritoneal cavity. A significant amount of free intraperitoneal air was not appreciated by fluoroscopy today. Electronically Signed   By: Aletta Edouard M.D.   On: 03/17/2021 13:42   DG Chest Port 1 View  Result Date: 03/06/2021 CLINICAL DATA:  Port-A-Cath placement EXAM: PORTABLE CHEST 1 VIEW COMPARISON:  03/02/2021 FINDINGS: Interval placement of right Port-A-Cath with the tip at the cavoatrial junction. Heart is normal size. Bibasilar atelectasis. There is lucency noted at the right base and over the visualized upper abdomen. This could reflect sub pulmonic right pneumothorax or pneumoperitoneum. IMPRESSION: Lucency at the right lung base and over the upper abdomen. This could reflect sub pulmonic pneumothorax or pneumoperitoneum. Consider decubitus views (left side down) of the chest and abdomen for further evaluation. These results will be called to the ordering clinician or representative by the Radiologist Assistant, and communication documented in the PACS or [employer]. Electronically Signed   By: Rolm Baptise M.D.    On: 03/06/2021 17:42   DG C-Arm 1-60 Min-No Report  Result Date: 03/06/2021 Fluoroscopy was utilized by the requesting physician.  No radiographic interpretation.     Assessment and plan- Patient is a 70 y.o. male with locally advanced GE junction adenocarcinoma T2/T3 N1 M0 stage IIIHere to discuss PET CT scan results and further management  Have reviewed PET/CT scan images independently and discussed findings with the patient which showed hypermetabolic activity in the GE junction and proximal gastric region.  There is a hypermetabolic distal esophageal and right gastric lymph node noted consistent with regional lymph node metastases.  No evidence of distant metastases noted on PET/CT scan.  Since the mass is completely obstructing-is not possible to get an EUS to get an accurate T staging.  He likely has T2 versus T3 disease.  I would like to refer him to [facility] Dr. Elenor Quinones to see if he would be a surgical candidate.  Patient already has a port placed and I would recommend neoadjuvant concurrent chemoradiation treatment with weekly CarboTaxol with carboplatin AUC 2 along with Taxol at 50 mg per metered squared given weekly for 5 weeks.He will be seeing radiation oncology to discuss radiation treatment as well.  Treatment will be given with a curative intent.  I will see him the day he starts radiation treatment.  Discussed risks and benefits of chemotherapy including all but not limited to possible nausea vomiting low blood counts risk of infections and hospitalization.  Risk of peripheral neuropathy associated with both carboplatin and Taxol.  Treatment will be given with a potential curative intent.  In the CROSS trial- 63 patients with potentially resectable esophageal or esophagogastric junction (EGJ) cancer (86 SCC, 273 AC, 4 other; majority distal esophageal, 11 percent EGJ) to preoperative CRT using weekly paclitaxel 50 mg/m2 plus carboplatin (area under the curve of  concentration X time [AUC] of 2) plus concurrent RT (41.4 Gy over five weeks) or surgery alone . Preoperative CRT was well tolerated, with grade 3 or worse hematologic toxicity in 7 percent, and grade 3 or higher non-hematologic toxicity in <13 percent; there were also no differences in postoperative morbidity or mortality between the two groups. The microscopically complete (R0) resection rate was higher with CRT (92 versus 69 percent), and 29 percent of those treated with CRT had  a pCR. At a median follow-up of 32 months, median overall survival was significantly better with preoperative CRT (HR for death 0.657, 95% CI 0.495-0.871, three-year survival rate 58 versus 44 percent).   Severe protein calorie malnutrition: Currently has a PEG tube in place and follows up with dietitian  Patient was previously on levothyroxine metoprolol and lisinopril among other medications which she was taking orally and is currently unable to do thatDue to complete obstruction from the esophageal cancer.  We will reach out to his PCP to see if his thyroid medication metoprolol can be changed to liquid or crushable form which can be given through PEG tube  Patient does report significant constipation and I have asked him to take MiraLAX powder mixed with water or juice along with senna syrup which can be given through his PEG tube at night  Cancer Staging Esophageal cancer [facility]) Staging form: Esophagus - Adenocarcinoma, AJCC 8th Edition - Clinical stage from 03/20/2021: Stage III (cT3, cN1, cM0, G2) - Signed by Sindy Guadeloupe, MD on 03/20/2021 Histologic grading system: 3 grade system      Visit Diagnosis 1. Malignant neoplasm of lower third of esophagus (HCC)   2. Goals of care, counseling/discussion      [name], MD, MPH [facility] [phone] 03/20/2021 12:49 PM

## 2021-03-20 NOTE — Progress Notes (Signed)
START ON PATHWAY REGIMEN - Gastroesophageal     Administer weekly during RT:     Paclitaxel      Carboplatin   **Always confirm dose/schedule in your pharmacy ordering system**  Patient Characteristics: Esophageal & GE Junction, Adenocarcinoma, Preoperative or Nonsurgical Candidate (Clinical Staging), cT2 or Higher or cN+, Surgical Candidate (Up to cT4a) - Preoperative Therapy, GE Junction Histology: Adenocarcinoma Disease Classification: GE Junction Therapeutic Status: Preoperative or Nonsurgical Candidate (Clinical Staging) AJCC Grade: G2 AJCC 8 Stage Grouping: III AJCC T Category: cT3 AJCC N Category: cN1 AJCC M Category: cM0 Intent of Therapy: Curative Intent, Discussed with Patient

## 2021-03-20 NOTE — Consult Note (Signed)
NEW PATIENT EVALUATION  Name: Alexander Tran  MRN: [id number]  Date:   03/20/2021     DOB: Feb 05, 1951   This 70 y.o. 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  REFERRING PHYSICIAN: Cherrie Distance, MD  CHIEF COMPLAINT:  Chief Complaint  Patient presents with   Esophageal Cancer    DIAGNOSIS: There were no encounter diagnoses.   PREVIOUS INVESTIGATIONS:  CT scan and PET CT scans reviewed Clinical notes reviewed Pathology report reviewed  HPI: Patient is a 70 year old male who presented to the ER with a 40 pound weight loss as well as persistent nausea and vomiting.  CT scan of his chest showed mildly dilated distal esophagus.  He underwent upper endoscopy showing a large fungating mass completely obstructing at the level of the GE junction.  Biopsy was positive for moderately differentiated adenocarcinoma.  He patient had a PET CT scan showing mild hypermetabolic activity along the GE junction as well as enlarged right gastric lymph node measuring 1.2 cm.  No metastatic disease to the liver was noted.  Patient has been seen by medical oncology and he is now referred to radiation oncology for consideration of concurrent chemoradiation.  He has had a PEG tube placed is putting on weight he is having no p.o. foods at this time.  Patient is also had a port placed.  Patient does have a remote history over 30 years ago of radiation therapy to his periaortic nodes for testicular tumor  PLANNED TREATMENT REGIMEN: Concurrent chemoradiation  PAST MEDICAL HISTORY:  has a past medical history of Acid reflux, Diabetes mellitus without complication ([place]), Hyperlipidemia, Hypertension, Manic depression ([place]), MI (myocardial infarction) ([place]), and 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[place]).    PAST SURGICAL HISTORY:  Past Surgical History:  Procedure Laterality Date   BACK SURGERY     CARDIAC CATHETERIZATION  Left 11/02/2015   Procedure: Left Heart Cath and Coronary Angiography;  Surgeon: Isaias Cowman, MD;  Location: [place] [facility];  Service: Cardiovascular;  Laterality: Left;   ESOPHAGOGASTRODUODENOSCOPY (EGD) WITH PROPOFOL N/A 03/03/2021   Procedure: ESOPHAGOGASTRODUODENOSCOPY (EGD) WITH PROPOFOL;  Surgeon: [name], MD;  Location: [facility];  Service: Endoscopy;  Laterality: N/A;   GASTROSTOMY N/A 03/04/2021   Procedure: INSERTION OF GASTROSTOMY TUBE;  Surgeon: Herbert Pun, MD;  Location: [facility];  Service: General;  Laterality: N/A;   IR CM INJ ANY COLONIC TUBE W/FLUORO  03/17/2021   PORTACATH PLACEMENT Right 03/06/2021   Procedure: INSERTION PORT-A-CATH;  Surgeon: Herbert Pun, MD;  Location: [facility];  Service: General;  Laterality: Right;   SURGERY SCROTAL / TESTICULAR      FAMILY HISTORY: family history includes Coronary artery disease in his father.  SOCIAL HISTORY:  reports that he has been smoking cigarettes. He has been smoking an average of 1 pack per day. He has never used smokeless tobacco. He reports current alcohol use of about 21.0 standard drinks per week. He reports that he does not use drugs.  ALLERGIES: Iodine and Other  MEDICATIONS:  Current Outpatient Medications  Medication Sig Dispense Refill   metFORMIN (GLUCOPHAGE) 1000 MG tablet Take 1 tablet (1,000 mg total) by mouth 2 (two) times daily with a meal. (Patient taking differently: Take 1,000 mg by mouth daily.) 60 tablet 3   Nutritional Supplements (FEEDING SUPPLEMENT, OSMOLITE 1.5 CAL,) LIQD Place 474 mLs into feeding tube with breakfast, with lunch, and with evening meal. 42660 mL 0   sennosides (SENOKOT)  8.8 MG/5ML syrup Take 5 mLs by mouth at bedtime. 236 mL 1   aspirin 81 MG EC tablet Take 1 tablet (81 mg total) by mouth daily. (Patient not taking: No sig reported) 30 tablet 3   atorvastatin (LIPITOR) 40 MG tablet Take 1 tablet (40 mg total) by mouth daily at 6 PM. (Patient  not taking: No sig reported) 30 tablet 3   blood glucose meter kit and supplies KIT Dispense based on patient and insurance preference. Use up to four times daily as directed. (FOR ICD-9 250.00, 250.01). (Patient not taking: No sig reported) 1 each 1   Insulin Pen Needle 31G X 4 MM MISC 1 pen by Does not apply route daily. Use with each dose and as directed (Patient not taking: No sig reported) 100 each 3   levothyroxine (SYNTHROID, LEVOTHROID) 100 MCG tablet Take 1 tablet (100 mcg total) by mouth every morning. (Patient not taking: No sig reported) 30 tablet 3   lisinopril (ZESTRIL) 2.5 MG tablet Take 2.5 mg by mouth daily. (Patient not taking: No sig reported)     metoprolol succinate (TOPROL-XL) 25 MG 24 hr tablet Take 25 mg by mouth daily. (Patient not taking: No sig reported)     metoprolol tartrate (LOPRESSOR) 25 MG tablet Take 1 tablet (25 mg total) by mouth 2 (two) times daily. (Patient not taking: No sig reported) 60 tablet 3   oxybutynin (DITROPAN-XL) 10 MG 24 hr tablet Take 10 mg by mouth daily. (Patient not taking: No sig reported)     pantoprazole (PROTONIX) 40 MG tablet Take 40 mg by mouth daily. (Patient not taking: No sig reported)     No current facility-administered medications for this encounter.    ECOG PERFORMANCE STATUS:  1 - Symptomatic but completely ambulatory  REVIEW OF SYSTEMS: Patient denies any weight loss, fatigue, weakness, fever, chills or night sweats. Patient denies any loss of vision, blurred vision. Patient denies any ringing  of the ears or hearing loss. No irregular heartbeat. Patient denies heart murmur or history of fainting. Patient denies any chest pain or pain radiating to her upper extremities. Patient denies any shortness of breath, difficulty breathing at night, cough or hemoptysis. Patient denies any swelling in the lower legs. Patient denies any nausea vomiting, vomiting of blood, or coffee ground material in the vomitus. Patient denies any stomach  pain. Patient states has had normal bowel movements no significant constipation or diarrhea. Patient denies any dysuria, hematuria or significant nocturia. Patient denies any problems walking, swelling in the joints or loss of balance. Patient denies any skin changes, loss of hair or loss of weight. Patient denies any excessive worrying or anxiety or significant depression. Patient denies any problems with insomnia. Patient denies excessive thirst, polyuria, polydipsia. Patient denies any swollen glands, patient denies easy bruising or easy bleeding. Patient denies any recent infections, allergies or URI. Patient "s visual fields have not changed significantly in recent time.   PHYSICAL EXAM: BP 129/74   Pulse 98   Temp 97.7 F (36.5 C) (Tympanic)   Resp 16   Wt 192 lb 4.8 oz (87.2 kg)   BMI 22.80 kg/m  Patient has a PEG tube placed which is actually functioning well.  Well-developed well-nourished patient in NAD. HEENT reveals PERLA, EOMI, discs not visualized.  Oral cavity is clear. No oral mucosal lesions are identified. Neck is clear without evidence of cervical or supraclavicular adenopathy. Lungs are clear to A&P. Cardiac examination is essentially unremarkable with regular rate and rhythm  without murmur rub or thrill. Abdomen is benign with no organomegaly or masses noted. Motor sensory and DTR levels are equal and symmetric in the upper and lower extremities. Cranial nerves II through XII are grossly intact. Proprioception is intact. No peripheral adenopathy or edema is identified. No motor or sensory levels are noted. Crude visual fields are within normal range.  LABORATORY DATA: Pathology report reviewed    RADIOLOGY RESULTS: CT scan and PET CT scan both reviewed compatible with above-stated findings   IMPRESSION: Locally advanced adenocarcinoma the distal esophagus in 70 year old male  PLAN: At this time I will plan delivering [place] in 30 fractions using IMRT treatment planning and  delivery.  I would choose IMRT to spare critical structures such as heart lung spinal cord.  We will include the hypermetabolic node in her treatment fields also.  Risks and benefits of radiation including again possibly increased dysphagia although the patient's nutrition is now being provided by PEG tube.  Fatigue alteration of blood counts skin reaction possible nausea all were described in detail to the patient and his son.  I have personally set up and ordered CT simulation.  There will be extra effort by both professional staff as well as technical staff to coordinate and manage concurrent chemoradiation and ensuing side effects during his treatments.  I would like to take this opportunity to thank you for allowing me to participate in the care of your patient.Noreene Filbert, MD

## 2021-03-22 ENCOUNTER — Encounter: Payer: Self-pay | Admitting: Oncology

## 2021-03-22 ENCOUNTER — Ambulatory Visit
Admission: RE | Admit: 2021-03-22 | Discharge: 2021-03-22 | Disposition: A | Discharge status: Home / Self Care | Payer: Medicare Other | Source: Ambulatory Visit | Attending: Radiation Oncology | Admitting: Radiation Oncology

## 2021-03-22 DIAGNOSIS — C155 Malignant neoplasm of lower third of esophagus: Secondary | ICD-10-CM | POA: Insufficient documentation

## 2021-03-22 DIAGNOSIS — Z510 Encounter for antineoplastic radiation therapy: Secondary | ICD-10-CM | POA: Diagnosis not present

## 2021-03-22 DIAGNOSIS — Z5111 Encounter for antineoplastic chemotherapy: Secondary | ICD-10-CM | POA: Insufficient documentation

## 2021-03-22 DIAGNOSIS — Z79899 Other long term (current) drug therapy: Secondary | ICD-10-CM | POA: Insufficient documentation

## 2021-03-23 ENCOUNTER — Other Ambulatory Visit: Payer: Self-pay

## 2021-03-23 ENCOUNTER — Inpatient Hospital Stay: Payer: Medicare Other

## 2021-03-23 ENCOUNTER — Telehealth: Payer: Self-pay

## 2021-03-23 NOTE — Progress Notes (Signed)
Nutrition Assessment:  Referral from Alexander Janese Tran, patient with new feeding tube and esophageal cancer.  70 year old male with new diagnosis of esophageal cancer.  Past medical history of testicular cancer, DM, GERD, HLD, HTN, MI, depression.   Patient planning to start concurrent chemotherapy and radiation.    Met with patient and son in clinic.  Son gives tube feeding to patient.  Son flushes tube with about 69m of water first then gives 2 cartons of osmolite 1.5. Uses additional 2033mduring feeding, mixes with formula. Son gives this at 8am, 12286pm.  Last feeding is at 10pm and gives about 3067mf water first then 1 carton of formula and ~150 ml during feeding.  Giving 1 metformin and liquid senna via tube.  Planning to meet with pharmacist regarding other medications that he is prescribed today.   Says that he has been having constipation. Last bowel movement about 4 days ago.  Has started taking liquid senna but has not added miralax.  Denies nausea or abdominal pain.  Reports darker color urine.  Noted sutures in place around G-tube.  Patient is not aware of follow-up with surgeon.   Only taking sips of water orally.   Says that he received delivery of tube feeding formula and supplies from [employer]     Medications: reviewed  Labs: reviewed  Anthropometrics:   Height: 6'5" Weight: 193 lb 4 oz today UBW: 225 lb in July 2022 BMI: 22  14% weight loss in the last 3 months, significant  Estimated Energy Needs  Kcals: 2175-2600 Protein: 108-130 g Fluid: 2.6 L  NUTRITION DIAGNOSIS: Inadequate oral intake related to esophageal mass causing obstruction as evidenced placement of feeding tube   INTERVENTION:  Message sent to nurse navigator and nursing regarding follow-up with surgeon and suture removal or not.   Continue 7 cartons of osmolite 1.5 via feeding tube daily.  Son will give 290 ml of water (flush first and after feeding) at first 3 feedings then 240 ml at last  feeding.   Tube feeding provides 2485 calories, 104 g protein and 2370 ml free water (from formula and water flush).  Additional water with medication flush.  Reviewed proper way to give medications via tube.   Son will add miralax as instructed by Alexander Alexander Banksd senna to help with constipation Patient has contact number for [facility] will contact them when supplies run low. Contact information provided    MONITORING, EVALUATION, GOAL: weight trends, tube feeding tolerance   NEXT VISIT: Wednesday, Nov 30 during infusion.    Alexander Tran B. AllZenia ResidesD,[place]istered Dietitian [phone])

## 2021-03-23 NOTE — Telephone Encounter (Signed)
Nutrition  Called patient to let him know that after communication with surgeon and nursing that cancer center nursing will remove sutures around feeding tube on 11/22 when he is here for chemo education.  Patient verbalized understanding and appreciative of call.  Alexander Tran B. Zenia Resides, [place], [employer] Registered Dietitian [phone] (mobile)

## 2021-03-27 DIAGNOSIS — Z510 Encounter for antineoplastic radiation therapy: Secondary | ICD-10-CM | POA: Diagnosis not present

## 2021-03-28 ENCOUNTER — Inpatient Hospital Stay: Payer: Medicare Other

## 2021-03-28 ENCOUNTER — Telehealth: Payer: Self-pay

## 2021-03-28 NOTE — Telephone Encounter (Signed)
Alexander Tran was unable to make his appointment at the cancer center today. Will plan to remove sutures at his next appointment, 04/03/21.

## 2021-03-29 NOTE — Progress Notes (Signed)
Pharmacist Chemotherapy Monitoring - Initial Assessment    Anticipated start date: 04/05/21   The following has been reviewed per standard work regarding the patient's treatment regimen: The patient's diagnosis, treatment plan and drug doses, and organ/hematologic function Lab orders and baseline tests specific to treatment regimen  The treatment plan start date, drug sequencing, and pre-medications Prior authorization status  Patient's documented medication list, including drug-drug interaction screen and prescriptions for anti-emetics and supportive care specific to the treatment regimen The drug concentrations, fluid compatibility, administration routes, and timing of the medications to be used The patient's access for treatment and lifetime cumulative dose history, if applicable  The patient's medication allergies and previous infusion related reactions, if applicable   Changes made to treatment plan:  treatment plan date  Follow up needed:  [place], [facility], 03/29/2021  10:32 AM

## 2021-04-03 ENCOUNTER — Ambulatory Visit: Admission: RE | Admit: 2021-04-03 | Payer: Medicare Other | Source: Ambulatory Visit

## 2021-04-04 ENCOUNTER — Ambulatory Visit
Admission: RE | Admit: 2021-04-04 | Discharge: 2021-04-04 | Disposition: A | Discharge status: Home / Self Care | Payer: Medicare Other | Source: Ambulatory Visit | Attending: Radiation Oncology | Admitting: Radiation Oncology

## 2021-04-04 ENCOUNTER — Other Ambulatory Visit: Payer: Self-pay | Admitting: *Deleted

## 2021-04-04 ENCOUNTER — Other Ambulatory Visit: Payer: Self-pay

## 2021-04-04 ENCOUNTER — Inpatient Hospital Stay: Payer: Medicare Other

## 2021-04-04 DIAGNOSIS — C155 Malignant neoplasm of lower third of esophagus: Secondary | ICD-10-CM

## 2021-04-04 DIAGNOSIS — Z510 Encounter for antineoplastic radiation therapy: Secondary | ICD-10-CM | POA: Diagnosis not present

## 2021-04-04 NOTE — Progress Notes (Signed)
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  CT abdomen and pelvis without contrast in the ER showed mildly dilated distal esophagus with fluid content.  Underlying mass or stricture cannot be excluded.  There was no locoregional adenopathy or distant metastatic disease noted on CT abdomen.  Patient was seen by Dr. Verl Blalock and underwent EGD today.  Large fungating mass with no bleeding or stigmata of recent bleeding noted at the GE junction 40 cm from the incisors.  Mass was completely obstructing and circumferential.     Biopsy showed moderately differentiated adenocarcinoma.Head CT scan showed distal esophageal activity with an SUV of 4.1 along the proximal stomach and GE junction.  0.6 cm distal paraesophageal lymph node with an SUV of 1.8.  Enlarged right gastric lymph node close proximity to the tumor 1.2 cm with an SUV of 2.5.   There was a large pneumoperitoneum in the upper abdomen which was also evaluated by general surgery Dr. Peyton Najjar who had put in the PEG tube and subsequent study did not show any evidence of contrast leakage into the peritoneal cavity.    Interval history-patient has not restarted his  outpatient metoprolol and levothyroxine.  He reports feeling constipated despite taking MiraLAX and senna.  He still spends most of his time in bed and does not walk around much around the house  ECOG PS- 2 Pain scale- 0   Review of systems- Review of Systems  Constitutional:  Positive for malaise/fatigue. Negative for chills, fever and weight loss.  HENT:  Negative for congestion, ear discharge and nosebleeds.   Eyes:  Negative for blurred vision.  Respiratory:  Negative for cough, hemoptysis, sputum production, shortness of breath and wheezing.   Cardiovascular:  Negative for chest pain, palpitations, orthopnea and claudication.  Gastrointestinal:  Positive for constipation. Negative for abdominal pain, blood in stool, diarrhea, heartburn, melena, nausea and vomiting.  Genitourinary:  Negative for dysuria, flank pain, frequency, hematuria and urgency.  Musculoskeletal:  Negative for back pain, joint pain and myalgias.  Skin:  Negative for rash.  Neurological:  Negative for dizziness, tingling, focal weakness, seizures, weakness and headaches.  Endo/Heme/Allergies:  Does not bruise/bleed easily.  Psychiatric/Behavioral:  Negative for depression and suicidal ideas. The patient does not have insomnia.       Allergies  Allergen Reactions   Iodine    Other Other (See Comments)    Uncoded Allergy. 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[place])      Past  Surgical History:  Procedure Laterality Date   BACK SURGERY     CARDIAC CATHETERIZATION Left 11/02/2015   Procedure: Left Heart Cath and Coronary Angiography;  Surgeon: Isaias Cowman, MD;  Location: [facility];  Service: Cardiovascular;  Laterality: Left;    ESOPHAGOGASTRODUODENOSCOPY (EGD) WITH PROPOFOL N/A 03/03/2021   Procedure: ESOPHAGOGASTRODUODENOSCOPY (EGD) WITH PROPOFOL;  Surgeon: [name], MD;  Location: [facility];  Service: Endoscopy;  Laterality: N/A;   GASTROSTOMY N/A 03/04/2021   Procedure: INSERTION OF GASTROSTOMY TUBE;  Surgeon: Herbert Pun, MD;  Location: [facility];  Service: General;  Laterality: N/A;   IR CM INJ ANY COLONIC TUBE W/FLUORO  03/17/2021   PORTACATH PLACEMENT Right 03/06/2021   Procedure: INSERTION PORT-A-CATH;  Surgeon: Herbert Pun, MD;  Location: [facility] OR[facility];  Service: General;  Laterality: Right;   SURGERY SCROTAL / TESTICULAR      Social History   Socioeconomic History   Marital status: Married    Spouse name: Not on file   Number of children: Not on file   Years of education: Not on file   Highest education level: Not on file  Occupational History   Occupation: retired Estate manager/land agent   Occupation: works part time now  Tobacco Use   Smoking status: Every Day    Packs/day: 1.00    Types: Cigarettes   Smokeless tobacco: Never  Vaping Use   Vaping Use: Never used  Substance and Sexual Activity   Alcohol use: Yes    Alcohol/week: 21.0 standard drinks    Types: 21 Glasses of wine per week   Drug use: No   Sexual activity: Not on file  Other Topics Concern   Not on file  Social History Narrative   Not on file   Social Determinants of Health   Financial Resource Strain: Not on file  Food Insecurity: Not on file  Transportation Needs: Not on file  Physical Activity: Not on file  Stress: Not on file  Social Connections: Not on file  Intimate Partner Violence: Not on file    Family History  Problem Relation Age of Onset   Coronary artery disease Father      Current Outpatient Medications:    aspirin 81 MG EC tablet, Take 1 tablet (81 mg total) by mouth daily. (Patient not taking: No sig reported), Disp: 30 tablet, Rfl: 3   atorvastatin (LIPITOR) 40 MG tablet,  Take 1 tablet (40 mg total) by mouth daily at 6 PM. (Patient not taking: No sig reported), Disp: 30 tablet, Rfl: 3   blood glucose meter kit and supplies KIT, Dispense based on patient and insurance preference. Use up to four times daily as directed. (FOR ICD-9 250.00, 250.01). (Patient not taking: No sig reported), Disp: 1 each, Rfl: 1   dexamethasone (DECADRON) 4 MG tablet, Take 2 tablets (8 mg total) by mouth daily. Start the day after chemotherapy for 2 days., Disp: 30 tablet, Rfl: 1   Insulin Pen Needle 31G X 4 MM MISC, 1 pen by Does not apply route daily. Use with each dose and as directed (Patient not taking: No sig reported), Disp: 100 each, Rfl: 3   levothyroxine (SYNTHROID, LEVOTHROID) 100 MCG tablet, Take 1 tablet (100 mcg total) by mouth every morning. (Patient not taking: No sig reported), Disp: 30 tablet, Rfl: 3   lidocaine-prilocaine (EMLA) cream, Apply to affected area once, Disp: 30 g, Rfl: 3   lisinopril (ZESTRIL) 2.5 MG tablet, Take 2.5 mg by mouth daily. (Patient not taking: No sig reported),  Disp: , Rfl:    LORazepam (ATIVAN) 0.5 MG tablet, Take 1 tablet (0.5 mg total) by mouth every 6 (six) hours as needed (Nausea or vomiting)., Disp: 30 tablet, Rfl: 0   metFORMIN (GLUCOPHAGE) 1000 MG tablet, Take 1 tablet (1,000 mg total) by mouth 2 (two) times daily with a meal. (Patient taking differently: Take 1,000 mg by mouth daily.), Disp: 60 tablet, Rfl: 3   metoprolol succinate (TOPROL-XL) 25 MG 24 hr tablet, Take 25 mg by mouth daily. (Patient not taking: No sig reported), Disp: , Rfl:    metoprolol tartrate (LOPRESSOR) 25 MG tablet, Take 1 tablet (25 mg total) by mouth 2 (two) times daily. (Patient not taking: No sig reported), Disp: 60 tablet, Rfl: 3   Nutritional Supplements (FEEDING SUPPLEMENT, OSMOLITE 1.5 CAL,) LIQD, Place 474 mLs into feeding tube with breakfast, with lunch, and with evening meal., Disp: 42660 mL, Rfl: 0   ondansetron (ZOFRAN) 8 MG tablet, Take 1 tablet (8 mg total)  by mouth 2 (two) times daily as needed for refractory nausea / vomiting. Start on day 3 after chemo., Disp: 30 tablet, Rfl: 1   oxybutynin (DITROPAN-XL) 10 MG 24 hr tablet, Take 10 mg by mouth daily. (Patient not taking: No sig reported), Disp: , Rfl:    pantoprazole (PROTONIX) 40 MG tablet, Take 40 mg by mouth daily. (Patient not taking: No sig reported), Disp: , Rfl:    prochlorperazine (COMPAZINE) 10 MG tablet, Take 1 tablet (10 mg total) by mouth every 6 (six) hours as needed (Nausea or vomiting)., Disp: 30 tablet, Rfl: 1   sennosides (SENOKOT) 8.8 MG/5ML syrup, Take 5 mLs by mouth at bedtime., Disp: 236 mL, Rfl: 1  Physical exam:  Vitals:   04/05/21 0904  BP: 111/71  Pulse: 95  Resp: 18  Temp: (!) 97.1 F (36.2 C)  SpO2: 98%  Weight: 197 lb (89.4 kg)   Physical Exam Cardiovascular:     Rate and Rhythm: Normal rate and regular rhythm.     Heart sounds: Normal heart sounds.  Pulmonary:     Effort: Pulmonary effort is normal.     Breath sounds: Normal breath sounds.  Abdominal:     General: Bowel sounds are normal.     Palpations: Abdomen is soft.     Comments: PEG tube in place  Skin:    General: Skin is warm and dry.  Neurological:     Mental Status: He is alert and oriented to person, place, and time.     CMP Latest Ref Rng & Units 03/04/2021  Glucose 70 - 99 mg/dL 88  BUN 8 - 23 mg/dL 35(H)  Creatinine 0.61 - 1.24 mg/dL 1.05  Sodium 135 - 145 mmol/L 137  Potassium 3.5 - 5.1 mmol/L 4.3  Chloride 98 - 111 mmol/L 108  CO2 22 - 32 mmol/L 26  Calcium 8.9 - 10.3 mg/dL 9.1  Total Protein 6.5 - 8.1 g/dL -  Total Bilirubin 0.3 - 1.2 mg/dL -  Alkaline Phos 38 - 126 U/L -  AST 15 - 41 U/L -  ALT 0 - 44 U/L -   CBC Latest Ref Rng & Units 03/04/2021  WBC 4.0 - 10.5 K/uL 6.2  Hemoglobin 13.0 - 17.0 g/dL 11.9(L)  Hematocrit 39.0 - 52.0 % 33.9(L)  Platelets 150 - 400 K/uL 188    No images are attached to the encounter.  NM PET Image Initial (PI) Skull Base To  Thigh  Addendum Date: 03/16/2021   ADDENDUM REPORT: 03/16/2021 13:16 ADDENDUM:  The original report was by Dr. Van Clines. The following addendum is by Dr. Van Clines: Critical Value/emergent results were called by telephone at the time of interpretation on 03/16/2021 at 12:52 pm to provider Beckey Rutter, NP, who verbally acknowledged these results. I later spoke with Dr. Janese Banks by telephone at 1:13 p.m., she informed me that the patient will be seeing her surgeon today and that information regarding the pneumoperitoneum has been passed on to the surgeon's office. Electronically Signed   By: Van Clines M.D.   On: 03/16/2021 13:16   Result Date: 03/16/2021 CLINICAL DATA:  Initial treatment strategy for esophageal cancer near the gastroesophageal junction. EXAM: NUCLEAR MEDICINE PET SKULL BASE TO THIGH TECHNIQUE: 10.4 mCi F-18 FDG was injected intravenously. Full-ring PET imaging was performed from the skull base to thigh after the radiotracer. CT data was obtained and used for attenuation correction and anatomic localization. Fasting blood glucose: 169 mg/dl COMPARISON:  CT abdomen 03/03/2021 FINDINGS: Mediastinal blood pool activity: SUV max 2.5 Liver activity: SUV max N/A NECK: No significant abnormal hypermetabolic activity in this region. Incidental CT findings: Mild bilateral common carotid atherosclerotic calcification. Small mucous retention cyst in the right maxillary sinus. CHEST: Mildly accentuated metabolic activity at the gastroesophageal junction. Distal esophageal activity with maximum SUV up to 4.1 and activity along the proximal stomach along the gastroesophageal junction with maximum SUV up to 4.5. This corroborates with the location of the patient's tumor. A 0.6 cm distal paraesophageal lymph node on image 140 of series 3 has a maximum SUV 1.8. Incidental CT findings: Coronary, aortic arch, and branch vessel atherosclerotic vascular disease. Right Port-A-Cath tip: SVC. Small  anterior pericardial effusion. Centrilobular emphysema. 4 by 3 mm right lower lobe pulmonary nodule on image 118 of series 3, no change from 10/28/2015, considered benign. ABDOMEN/PELVIS: Activity along the gastroesophageal junction as noted in the chest section above. There is a enlarged right gastric lymph node in fairly close proximity to the tumor, measuring 1.2 cm in short axis on image 164 series 3 (back on 10/28/2015 this lymph node measured 0.7 cm in short axis). This lymph node has a maximum SUV of 2.5. Physiologic activity in bowel. No hepatic hypermetabolic lesions identified. Incidental CT findings: Peg tube noted with the stomach in the vicinity of the tube abutting the anterior abdominal wall. However, there is a large pneumoperitoneum primarily in the upper abdomen, which would not be normal 12 days out from PEG tube placement. Atherosclerosis is present, including aortoiliac atherosclerotic disease. There are atherosclerotic calcifications in the SMA, celiac trunk, and IMA. No obvious pneumatosis along the bowel. Descending and sigmoid colon diverticulosis. No dilated small bowel. No loculated fluid collections. No obvious bowel wall thickening although today's CT data was obtained without IV contrast. Mild wall thickening in the urinary bladder somewhat eccentric to the right. SKELETON: No significant abnormal hypermetabolic activity in this region. Incidental CT findings: Lumbar spondylosis and degenerative disc disease at L4-5. IMPRESSION: 1. There is a large mount of free intraperitoneal gas mostly in the upper abdomen. A direct cause is not identified. The patient had a PEG tube placed 12 days ago, this is too much gas and too far out from placement to be normal pneumoperitoneum associated with the procedure, raising the possibility of air leak around the tube into the peritoneal cavity. The patient does have substantial atherosclerosis, but I do not see obvious signs of pneumatosis or bowel  wall thickening to suggest ischemic bowel with perforation. Careful correlation with lactate levels, patient's  symptoms and physical exam, and surgical consultation recommended. If further workup of the vascular system is warranted, abdominopelvic CT angiography might be considered. 2. Mildly hypermetabolic tumor along the gastroesophageal junction where the maximum SUV is 4.5. There is also an enlarged right gastric lymph node measuring 1.2 cm in short axis with maximum SUV of 2.5. No hypermetabolic liver lesion identified. No distant metastatic lesions observed. 3. Mild wall thickening in the urinary bladder with right eccentricity slightly more apparent than on 03/03/2021. Probably incidental due to low bladder volumes, sessile bladder tumor is a less likely differential diagnostic consideration. If the patient has hematuria then further workup by Urology would be recommended. 4. Other imaging findings of potential clinical significance: Chronic right maxillary sinusitis. Aortic Atherosclerosis (ICD10-I70.0). Coronary and carotid atherosclerosis. Emphysema (ICD10-J43.9). Degenerative disc disease at L4-5. Radiology assistant personnel have been notified to put me in telephone contact with the referring physician or the referring physician's clinical representative in order to discuss these findings. Once this communication is established I will issue an addendum to this report for documentation purposes. Electronically Signed: By: Van Clines M.D. On: 03/16/2021 12:24   IR Cm Inj Any Colonic Tube W/Fluoro  Result Date: 03/17/2021 INDICATION: History of obstructing carcinoma of the GE junction and status post surgical gastrostomy tube placement on 03/06/2021. The patient has been using the tube successfully at home for tube feeds without difficulty or pain. It was noted by PET scan on 03/15/2021 that there was a moderate amount free intraperitoneal air within the abdomen. EXAM: INJECTION OF GASTROSTOMY  TUBE UNDER FLUOROSCOPY MEDICATIONS: None ANESTHESIA/SEDATION: None CONTRAST:  30 mL Omnipaque 350 - administered into the gastric lumen. FLUOROSCOPY TIME:  Fluoroscopy Time: 42 seconds.  5.1 mGy. COMPLICATIONS: None immediate. PROCEDURE: The pre-existing gastrostomy tube was injected with contrast material under fluoroscopy. Real-time fluoroscopy was performed to evaluate contrast flow in multiple projections. FINDINGS: A balloon retention gastrostomy tube is appropriately position within the body of the stomach. Injected contrast material flows preferentially into the proximal stomach with the patient supine. No evidence of contrast leakage into the peritoneal cavity or outside of the patient's body. The patient was rolled up onto his right side and a small amount of contrast did flow towards the distal stomach. A large amount of free intraperitoneal air was not appreciated by fluoroscopy in the supine position. The patient also was clearly not tender on exam or experiencing any abdominal pain. IMPRESSION: Appropriate position of gastrostomy tube in the body of the stomach without evidence of contrast leakage into the peritoneal cavity. A significant amount of free intraperitoneal air was not appreciated by fluoroscopy today. Electronically Signed   By: Aletta Edouard M.D.   On: 03/17/2021 13:42   DG Chest Port 1 View  Result Date: 03/06/2021 CLINICAL DATA:  Port-A-Cath placement EXAM: PORTABLE CHEST 1 VIEW COMPARISON:  03/02/2021 FINDINGS: Interval placement of right Port-A-Cath with the tip at the cavoatrial junction. Heart is normal size. Bibasilar atelectasis. There is lucency noted at the right base and over the visualized upper abdomen. This could reflect sub pulmonic right pneumothorax or pneumoperitoneum. IMPRESSION: Lucency at the right lung base and over the upper abdomen. This could reflect sub pulmonic pneumothorax or pneumoperitoneum. Consider decubitus views (left side down) of the chest and  abdomen for further evaluation. These results will be called to the ordering clinician or representative by the Radiologist Assistant, and communication documented in the PACS or [employer]. Electronically Signed   By: Rolm Baptise M.D.  On: 03/06/2021 17:42   DG C-Arm 1-60 Min-No Report  Result Date: 03/06/2021 Fluoroscopy was utilized by the requesting physician.  No radiographic interpretation.     Assessment and plan- Patient is a 70 y.o. male  with locally advanced GE junction adenocarcinoma T2/T3 N1 M0 stage III.  He is here for on treatment assessment prior to cycle 1 of neoadjuvant CarboTaxol chemotherapy concurrent with radiation  Counts okay to proceed with cycle 1 of neoadjuvant CarboTaxol chemotherapy today.  He started radiation treatment on 04/03/2021.  He will directly proceed for cycle 2 of treatment next week and will be seen by covering NP in 2 weeks for cycle 3.  I will see him back again for cycle 5.  Patient has an upcoming appointment with [facility] thoracic surgery next month.  Plan is to complete PET CT scan after concurrent chemoradiation is completed  Discussed risks and benefits of chemotherapy including all but not limited to nausea vomiting, low blood counts, risk of infections and hospitalization.  Patient understands and agrees to proceed as planned.  Severe protein calorie malnutrition: Currently has a PEG tube in place.  Blood sugars have been running high and consideration may have to be given to switch him from Osmolite to Glucerna.  Patient is still not restarted his outpatient medications including metoprolol lisinopril and levothyroxine through his PEG tube.Today's blood pressure is 111/71 and he does not have to restart lisinopril.  However it would be advisable to restart metoprolol and levothyroxine especially since the later can be contributing to his constipation.  We will reach out to pharmacy to see which formulations can be switched over and how  through the PEG tube to get this started ASAP  I have encouraged patient to get more out of bed and start doing some light exercises.  In order to be a surgical candidate patient's performance status needs to improve   Visit Diagnosis 1. Malignant neoplasm of lower third of esophagus (HCC)   2. Encounter for antineoplastic chemotherapy      [name], MD, MPH [facility] at [facility] [phone] 04/04/2021 6:01 PM

## 2021-04-05 ENCOUNTER — Encounter: Payer: Self-pay | Admitting: Oncology

## 2021-04-05 ENCOUNTER — Inpatient Hospital Stay: Payer: Medicare Other

## 2021-04-05 ENCOUNTER — Inpatient Hospital Stay (HOSPITAL_BASED_OUTPATIENT_CLINIC_OR_DEPARTMENT_OTHER): Payer: Medicare Other | Admitting: Oncology

## 2021-04-05 ENCOUNTER — Ambulatory Visit
Admission: RE | Admit: 2021-04-05 | Discharge: 2021-04-05 | Disposition: A | Discharge status: Home / Self Care | Payer: Medicare Other | Source: Ambulatory Visit | Attending: Radiation Oncology | Admitting: Radiation Oncology

## 2021-04-05 VITALS — BP 111/71 | HR 95 | Temp 97.1°F | Resp 18 | Wt 197.0 lb

## 2021-04-05 VITALS — BP 138/73 | HR 64

## 2021-04-05 DIAGNOSIS — Z5111 Encounter for antineoplastic chemotherapy: Secondary | ICD-10-CM

## 2021-04-05 DIAGNOSIS — C155 Malignant neoplasm of lower third of esophagus: Secondary | ICD-10-CM

## 2021-04-05 DIAGNOSIS — Z510 Encounter for antineoplastic radiation therapy: Secondary | ICD-10-CM | POA: Diagnosis not present

## 2021-04-05 LAB — CBC WITH DIFFERENTIAL/PLATELET
Abs Immature Granulocytes: 0.05 10*3/uL (ref 0.00–0.07)
Basophils Absolute: 0 10*3/uL (ref 0.0–0.1)
Basophils Relative: 0 %
Eosinophils Absolute: 0.4 10*3/uL (ref 0.0–0.5)
Eosinophils Relative: 4 %
HCT: 32.7 % — ABNORMAL LOW (ref 39.0–52.0)
Hemoglobin: 11.7 g/dL — ABNORMAL LOW (ref 13.0–17.0)
Immature Granulocytes: 1 %
Lymphocytes Relative: 13 %
Lymphs Abs: 1.3 10*3/uL (ref 0.7–4.0)
MCH: 34.8 pg — ABNORMAL HIGH (ref 26.0–34.0)
MCHC: 35.8 g/dL (ref 30.0–36.0)
MCV: 97.3 fL (ref 80.0–100.0)
Monocytes Absolute: 0.9 10*3/uL (ref 0.1–1.0)
Monocytes Relative: 9 %
Neutro Abs: 7 10*3/uL (ref 1.7–7.7)
Neutrophils Relative %: 73 %
Platelets: 234 10*3/uL (ref 150–400)
RBC: 3.36 MIL/uL — ABNORMAL LOW (ref 4.22–5.81)
RDW: 14.4 % (ref 11.5–15.5)
WBC: 9.6 10*3/uL (ref 4.0–10.5)
nRBC: 0 % (ref 0.0–0.2)

## 2021-04-05 LAB — HEPATITIS B CORE ANTIBODY, TOTAL: Hep B Core Total Ab: NONREACTIVE

## 2021-04-05 LAB — COMPREHENSIVE METABOLIC PANEL
ALT: 25 U/L (ref 0–44)
AST: 21 U/L (ref 15–41)
Albumin: 3.7 g/dL (ref 3.5–5.0)
Alkaline Phosphatase: 100 U/L (ref 38–126)
Anion gap: 8 (ref 5–15)
BUN: 36 mg/dL — ABNORMAL HIGH (ref 8–23)
CO2: 26 mmol/L (ref 22–32)
Calcium: 9.1 mg/dL (ref 8.9–10.3)
Chloride: 97 mmol/L — ABNORMAL LOW (ref 98–111)
Creatinine, Ser: 0.79 mg/dL (ref 0.61–1.24)
GFR, Estimated: >60 mL/min (ref >60)
Glucose, Bld: 336 mg/dL — ABNORMAL HIGH (ref 70–99)
Potassium: 4.6 mmol/L (ref 3.5–5.1)
Sodium: 131 mmol/L — ABNORMAL LOW (ref 135–145)
Total Bilirubin: 0.6 mg/dL (ref 0.3–1.2)
Total Protein: 6.7 g/dL (ref 6.5–8.1)

## 2021-04-05 LAB — HEPATITIS B SURFACE ANTIGEN: Hepatitis B Surface Ag: NONREACTIVE

## 2021-04-05 MED ORDER — HEPARIN SOD (PORK) LOCK FLUSH 100 UNIT/ML IV SOLN
INTRAVENOUS | Status: AC
  Filled 2021-04-05: qty 5

## 2021-04-05 MED ORDER — SODIUM CHLORIDE 0.9 % IV SOLN
50.0000 mg/m2 | Freq: Once | INTRAVENOUS | Status: AC
  Administered 2021-04-05: 108 mg via INTRAVENOUS
  Filled 2021-04-05: qty 18

## 2021-04-05 MED ORDER — PALONOSETRON HCL INJECTION 0.25 MG/5ML
0.2500 mg | Freq: Once | INTRAVENOUS | Status: AC
  Administered 2021-04-05: 0.25 mg via INTRAVENOUS
  Filled 2021-04-05: qty 5

## 2021-04-05 MED ORDER — SODIUM CHLORIDE 0.9 % IV SOLN
224.4000 mg | Freq: Once | INTRAVENOUS | Status: AC
  Administered 2021-04-05: 220 mg via INTRAVENOUS
  Filled 2021-04-05: qty 22

## 2021-04-05 MED ORDER — DIPHENHYDRAMINE HCL 50 MG/ML IJ SOLN
50.0000 mg | Freq: Once | INTRAMUSCULAR | Status: AC
  Administered 2021-04-05: 50 mg via INTRAVENOUS
  Filled 2021-04-05: qty 1

## 2021-04-05 MED ORDER — FAMOTIDINE 20 MG IN NS 100 ML IVPB
20.0000 mg | Freq: Once | INTRAVENOUS | Status: AC
  Administered 2021-04-05: 20 mg via INTRAVENOUS
  Filled 2021-04-05: qty 20

## 2021-04-05 MED ORDER — SODIUM CHLORIDE 0.9 % IV SOLN
Freq: Once | INTRAVENOUS | Status: AC
  Filled 2021-04-05: qty 250

## 2021-04-05 MED ORDER — GLUCERNA 1.5 CAL PO LIQD: ORAL | 2 refills | Status: AC

## 2021-04-05 MED ORDER — SODIUM CHLORIDE 0.9 % IV SOLN
10.0000 mg | Freq: Once | INTRAVENOUS | Status: AC
  Administered 2021-04-05: 10 mg via INTRAVENOUS
  Filled 2021-04-05: qty 10

## 2021-04-05 NOTE — Patient Instructions (Addendum)
[facility] [place] [facility]  Discharge Instructions: Thank you for choosing  Cancer Center to provide your oncology and hematology care.  If you have a lab appointment with the Cancer Center, please go directly to the Cancer Center and check in at the registration area.  Wear comfortable clothing and clothing appropriate for easy access to any Portacath or PICC line.   We strive to give you quality time with your provider. You may need to reschedule your appointment if you arrive late (15 or more minutes).  Arriving late affects you and other patients whose appointments are after yours.  Also, if you miss three or more appointments without notifying the office, you may be dismissed from the clinic at the provider's discretion.      For prescription refill requests, have your pharmacy contact our office and allow 72 hours for refills to be completed.    Today you received the following chemotherapy and/or immunotherapy agents Taxol & Carboplatin      To help prevent nausea and vomiting after your treatment, we encourage you to take your nausea medication as directed.  BELOW ARE SYMPTOMS THAT SHOULD BE REPORTED IMMEDIATELY: *FEVER GREATER THAN 100.4 F (38 C) OR HIGHER *CHILLS OR SWEATING *NAUSEA AND VOMITING THAT IS NOT CONTROLLED WITH YOUR NAUSEA MEDICATION *UNUSUAL SHORTNESS OF BREATH *UNUSUAL BRUISING OR BLEEDING *URINARY PROBLEMS (pain or burning when urinating, or frequent urination) *BOWEL PROBLEMS (unusual diarrhea, constipation, pain near the anus) TENDERNESS IN MOUTH AND THROAT WITH OR WITHOUT PRESENCE OF ULCERS (sore throat, sores in mouth, or a toothache) UNUSUAL RASH, SWELLING OR PAIN  UNUSUAL VAGINAL DISCHARGE OR ITCHING   Items with * indicate a potential emergency and should be followed up as soon as possible or go to the Emergency Department if any problems should occur.  Please show the CHEMOTHERAPY ALERT CARD or IMMUNOTHERAPY ALERT CARD at  check-in to the Emergency Department and triage nurse.  Should you have questions after your visit or need to cancel or reschedule your appointment, please contact [facility] [place] [facility]  [phone] and follow the prompts.  Office hours are 8:00 a.m. to 4:30 p.m. Monday - Friday. Please note that voicemails left after 4:00 p.m. may not be returned until the following business day.  We are closed weekends and major holidays. You have access to a nurse at all times for urgent questions. Please call the main number to the clinic [phone] and follow the prompts.  For any non-urgent questions, you may also contact your provider using MyChart. We now offer e-Visits for anyone 18 and older to request care online for non-urgent symptoms. For details visit [email or web address].   Also download the MyChart app! Go to the app store, search "MyChart", open the app, select , and log in with your MyChart username and password.  Due to Covid, a mask is required upon entering the hospital/clinic. If you do not have a mask, one will be given to you upon arrival. For doctor visits, patients may have 1 support person aged 18 or older with them. For treatment visits, patients cannot have anyone with them due to current Covid guidelines and our immunocompromised population.  

## 2021-04-05 NOTE — Progress Notes (Addendum)
Nutrition Follow-up:  Patient with esophageal cancer.  Patient with history of DM, HLD, HTN.  Patient has started radiation and chemotherapy.  PEG tube placed during recent admission.   Met with patient during infusion today.  Patient reports that he continues to give 7 cartons per day of osmolite 1.5 (2 cartons at 3 feedings and 1 carton at 1 feeding).  Water flush has increased to 290 ml at 3 feeding and 240 at 4th feeding.  Patient not taking anything by mouth.  Reports constipation.   Sutures have been removed around PEG tube (11/29)  Patient reports taking metformin nightly without missing doses.    Medications: reviewed  Labs: Na 131, glucose 336, BUN 36, creatinine 0.79  Anthropometrics:   Weight 197 lb today  193 lb 4 oz on 11/17 225 lb July 2022   Estimated Energy Needs  Kcals: 2175-2600 Protein: 108-130 g Fluid: 2.6 L  NUTRITION DIAGNOSIS: Inadequate oral intake continues but relying on feeding tube   INTERVENTION:  Recommend switching to glucerna 1.5, 7 cartons per day (2 cartons at 3 feedings and 1 carton at 1 feeding) due to elevated blood glucose level.  Water flush of 290 ml at 3 feedings and 240 ml at 1 feeding.  Discussed with Dr Janese Banks. Tube feeding will provide 2492 calories, 137 g protein and 2370 ml free water (flush and free water in formula) MD feels constipation coming from not taking thyroid medication. Written instructions given to patient regarding new tube feeding formula and how to care for tube and clean site.   Coordination of care with [facility] representative, Thedore Mins to make switch to new formula.       MONITORING, EVALUATION, GOAL: Weight trends, tube feeding    NEXT VISIT: Wednesday, 12/7 during infusion  Merry Pond B. Zenia Resides, [place], [place] Registered Dietitian [phone] (mobile)

## 2021-04-05 NOTE — Progress Notes (Signed)
Pt states he has been having severe constipation last BM was on Saturday. He has been taking senna and miralax but they do not seem to be help at all.

## 2021-04-05 NOTE — Addendum Note (Signed)
Addended by: [name] B on: 04/05/2021 02:17 PM   Modules accepted: Orders

## 2021-04-06 ENCOUNTER — Ambulatory Visit
Admission: RE | Admit: 2021-04-06 | Discharge: 2021-04-06 | Disposition: A | Discharge status: Home / Self Care | Payer: Medicare Other | Source: Ambulatory Visit | Attending: Radiation Oncology | Admitting: Radiation Oncology

## 2021-04-06 ENCOUNTER — Telehealth: Payer: Self-pay

## 2021-04-06 DIAGNOSIS — Z510 Encounter for antineoplastic radiation therapy: Secondary | ICD-10-CM | POA: Diagnosis present

## 2021-04-06 DIAGNOSIS — E871 Hypo-osmolality and hyponatremia: Secondary | ICD-10-CM | POA: Insufficient documentation

## 2021-04-06 DIAGNOSIS — C155 Malignant neoplasm of lower third of esophagus: Secondary | ICD-10-CM | POA: Insufficient documentation

## 2021-04-06 DIAGNOSIS — C160 Malignant neoplasm of cardia: Secondary | ICD-10-CM | POA: Insufficient documentation

## 2021-04-06 DIAGNOSIS — Z79899 Other long term (current) drug therapy: Secondary | ICD-10-CM | POA: Diagnosis not present

## 2021-04-06 LAB — HEPATITIS B SURFACE ANTIBODY, QUANTITATIVE: Hep B S AB Quant (Post): <3.1 m[IU]/mL — ABNORMAL LOW

## 2021-04-06 NOTE — Telephone Encounter (Signed)
Telephone call to patient for follow up after receiving first infusion.   Patient states infusion went great.  States eating good and drinking plenty of fluids.   Denies any nausea or vomiting.  Encouraged patient to call for any concerns or questions. 

## 2021-04-07 ENCOUNTER — Other Ambulatory Visit: Payer: Self-pay | Admitting: Oncology

## 2021-04-07 ENCOUNTER — Ambulatory Visit
Admission: RE | Admit: 2021-04-07 | Discharge: 2021-04-07 | Disposition: A | Discharge status: Home / Self Care | Payer: Medicare Other | Source: Ambulatory Visit | Attending: Radiation Oncology | Admitting: Radiation Oncology

## 2021-04-07 ENCOUNTER — Other Ambulatory Visit: Payer: Self-pay | Admitting: *Deleted

## 2021-04-07 DIAGNOSIS — Z510 Encounter for antineoplastic radiation therapy: Secondary | ICD-10-CM | POA: Diagnosis not present

## 2021-04-07 MED ORDER — TIROSINT-SOL 100 MCG/ML PO SOLN: 1.0000 mL | Freq: Every day | ORAL | 3 refills | Status: DC

## 2021-04-10 ENCOUNTER — Ambulatory Visit
Admission: RE | Admit: 2021-04-10 | Discharge: 2021-04-10 | Disposition: A | Discharge status: Home / Self Care | Payer: Medicare Other | Source: Ambulatory Visit | Attending: Radiation Oncology | Admitting: Radiation Oncology

## 2021-04-10 DIAGNOSIS — Z510 Encounter for antineoplastic radiation therapy: Secondary | ICD-10-CM | POA: Diagnosis not present

## 2021-04-11 ENCOUNTER — Encounter: Payer: Self-pay | Admitting: Oncology

## 2021-04-11 ENCOUNTER — Ambulatory Visit
Admission: RE | Admit: 2021-04-11 | Discharge: 2021-04-11 | Disposition: A | Discharge status: Home / Self Care | Payer: Medicare Other | Source: Ambulatory Visit | Attending: Radiation Oncology | Admitting: Radiation Oncology

## 2021-04-11 DIAGNOSIS — Z510 Encounter for antineoplastic radiation therapy: Secondary | ICD-10-CM | POA: Diagnosis not present

## 2021-04-12 ENCOUNTER — Ambulatory Visit
Admission: RE | Admit: 2021-04-12 | Discharge: 2021-04-12 | Disposition: A | Discharge status: Home / Self Care | Payer: Medicare Other | Source: Ambulatory Visit | Attending: Radiation Oncology | Admitting: Radiation Oncology

## 2021-04-12 ENCOUNTER — Inpatient Hospital Stay: Payer: Medicare Other

## 2021-04-12 ENCOUNTER — Other Ambulatory Visit: Payer: Self-pay

## 2021-04-12 VITALS — BP 116/59 | HR 81 | Temp 99.0°F | Resp 20 | Wt 199.0 lb

## 2021-04-12 DIAGNOSIS — Z510 Encounter for antineoplastic radiation therapy: Secondary | ICD-10-CM | POA: Diagnosis not present

## 2021-04-12 DIAGNOSIS — Z79899 Other long term (current) drug therapy: Secondary | ICD-10-CM | POA: Insufficient documentation

## 2021-04-12 DIAGNOSIS — E871 Hypo-osmolality and hyponatremia: Secondary | ICD-10-CM | POA: Insufficient documentation

## 2021-04-12 DIAGNOSIS — Z5111 Encounter for antineoplastic chemotherapy: Secondary | ICD-10-CM | POA: Insufficient documentation

## 2021-04-12 DIAGNOSIS — C160 Malignant neoplasm of cardia: Secondary | ICD-10-CM | POA: Insufficient documentation

## 2021-04-12 DIAGNOSIS — C155 Malignant neoplasm of lower third of esophagus: Secondary | ICD-10-CM

## 2021-04-12 LAB — COMPREHENSIVE METABOLIC PANEL
ALT: 23 U/L (ref 0–44)
AST: 20 U/L (ref 15–41)
Albumin: 3.5 g/dL (ref 3.5–5.0)
Alkaline Phosphatase: 92 U/L (ref 38–126)
Anion gap: 10 (ref 5–15)
BUN: 35 mg/dL — ABNORMAL HIGH (ref 8–23)
CO2: 25 mmol/L (ref 22–32)
Calcium: 9.3 mg/dL (ref 8.9–10.3)
Chloride: 94 mmol/L — ABNORMAL LOW (ref 98–111)
Creatinine, Ser: 1.02 mg/dL (ref 0.61–1.24)
GFR, Estimated: >60 mL/min (ref >60)
Glucose, Bld: 396 mg/dL — ABNORMAL HIGH (ref 70–99)
Potassium: 4.6 mmol/L (ref 3.5–5.1)
Sodium: 129 mmol/L — ABNORMAL LOW (ref 135–145)
Total Bilirubin: 0.5 mg/dL (ref 0.3–1.2)
Total Protein: 6.5 g/dL (ref 6.5–8.1)

## 2021-04-12 LAB — CBC WITH DIFFERENTIAL/PLATELET
Abs Immature Granulocytes: 0.04 10*3/uL (ref 0.00–0.07)
Basophils Absolute: 0 10*3/uL (ref 0.0–0.1)
Basophils Relative: 1 %
Eosinophils Absolute: 0.3 10*3/uL (ref 0.0–0.5)
Eosinophils Relative: 4 %
HCT: 31.1 % — ABNORMAL LOW (ref 39.0–52.0)
Hemoglobin: 11.2 g/dL — ABNORMAL LOW (ref 13.0–17.0)
Immature Granulocytes: 1 %
Lymphocytes Relative: 11 %
Lymphs Abs: 0.7 10*3/uL (ref 0.7–4.0)
MCH: 34.8 pg — ABNORMAL HIGH (ref 26.0–34.0)
MCHC: 36 g/dL (ref 30.0–36.0)
MCV: 96.6 fL (ref 80.0–100.0)
Monocytes Absolute: 0.5 10*3/uL (ref 0.1–1.0)
Monocytes Relative: 7 %
Neutro Abs: 4.8 10*3/uL (ref 1.7–7.7)
Neutrophils Relative %: 76 %
Platelets: 230 10*3/uL (ref 150–400)
RBC: 3.22 MIL/uL — ABNORMAL LOW (ref 4.22–5.81)
RDW: 14 % (ref 11.5–15.5)
WBC: 6.3 10*3/uL (ref 4.0–10.5)
nRBC: 0 % (ref 0.0–0.2)

## 2021-04-12 MED ORDER — SODIUM CHLORIDE 0.9% FLUSH
10.0000 mL | Freq: Once | INTRAVENOUS | Status: AC
  Administered 2021-04-12: 10 mL via INTRAVENOUS
  Filled 2021-04-12: qty 10

## 2021-04-12 MED ORDER — SODIUM CHLORIDE 0.9 % IV SOLN
221.0000 mg | Freq: Once | INTRAVENOUS | Status: AC
  Administered 2021-04-12: 220 mg via INTRAVENOUS
  Filled 2021-04-12: qty 22

## 2021-04-12 MED ORDER — SODIUM CHLORIDE 0.9 % IV SOLN
50.0000 mg/m2 | Freq: Once | INTRAVENOUS | Status: AC
  Administered 2021-04-12: 108 mg via INTRAVENOUS
  Filled 2021-04-12: qty 18

## 2021-04-12 MED ORDER — DIPHENHYDRAMINE HCL 50 MG/ML IJ SOLN
50.0000 mg | Freq: Once | INTRAMUSCULAR | Status: AC
  Administered 2021-04-12: 50 mg via INTRAVENOUS
  Filled 2021-04-12: qty 1

## 2021-04-12 MED ORDER — SODIUM CHLORIDE 0.9 % IV SOLN
Freq: Once | INTRAVENOUS | Status: AC
  Filled 2021-04-12: qty 250

## 2021-04-12 MED ORDER — HEPARIN SOD (PORK) LOCK FLUSH 100 UNIT/ML IV SOLN
500.0000 [IU] | Freq: Once | INTRAVENOUS | Status: AC
  Administered 2021-04-12: 500 [IU] via INTRAVENOUS
  Filled 2021-04-12: qty 5

## 2021-04-12 MED ORDER — FAMOTIDINE 20 MG IN NS 100 ML IVPB
20.0000 mg | Freq: Once | INTRAVENOUS | Status: AC
  Administered 2021-04-12: 20 mg via INTRAVENOUS
  Filled 2021-04-12: qty 20

## 2021-04-12 MED ORDER — PALONOSETRON HCL INJECTION 0.25 MG/5ML
0.2500 mg | Freq: Once | INTRAVENOUS | Status: AC
  Administered 2021-04-12: 0.25 mg via INTRAVENOUS
  Filled 2021-04-12: qty 5

## 2021-04-12 MED ORDER — SODIUM CHLORIDE 0.9 % IV SOLN
10.0000 mg | Freq: Once | INTRAVENOUS | Status: AC
  Administered 2021-04-12: 10 mg via INTRAVENOUS
  Filled 2021-04-12: qty 10

## 2021-04-12 NOTE — Progress Notes (Signed)
Nutrition Follow-up:  Patient with esophageal cancer.  Patient with PEG tube placed on 10/29.  Patient receiving chemotherapy and radiation.    Met with patient during infusion.  Patient has not received Glucerna 1.5 formula as of today.  Continues to give osmolite 1.5, 7 cartons per day.  Water flush is the same.  Reports that he had diarrhea towards the end of last week and 2 days only took 5 cartons of formula vs full 7.  Patient reports that he is currently have 1 watery stool per day.  Has stopped the miralax but has continued the senokot at night.  Says that his glucose meter is not working and requesting new one.  Patient says that he will start thyroid medication today.     Medications: reviewed  Labs: Na 129, glucose 396, BUN 35, creatinine 1.02  Anthropometrics:   Weight 199 lb today  197 lb on 11/30 193 lb 4 oz on 11/17 225 lb July 2022   Estimated Energy Needs  Kcals: 2175-2600 Protein: 108-130 g Fluid: 2.6 L  NUTRITION DIAGNOSIS: Inadequate oral intake continues but relying on feeding tube.    INTERVENTION:  RD called [facility] and spoke with Zach.  Glucerna 1.5 is out for delivery today and should be received by 9pm.  Once received start glucerna 1.5, 7 cartons per day (same regimen as using osmolite 1.5). Flush with 233m at first 3 feedings and 2422mat last feeding of the day.  Patient will use 1 liter of pedialyte instead of 1 liter of water due to low Na levels. Discussed with MD.  Patient will continue to flush with water for medication administration.  Discussed with Dr RaJanese BanksDr RaJanese Banksill write script for new meter Spoke with Dr RaJanese Banksnd wants patient to stop senokot at night and can add back if needed All above discussed with patient face to face and called son Joey via phone.  Both patient and son verbalized understanding.     MONITORING, EVALUATION, GOAL: weight trends, tube feeding    NEXT VISIT: Wed, Dec 14 during infusion  Marynell Bies B. AlZenia ResidesRDWasco LDHill Cityegistered Dietitian [phone])

## 2021-04-13 ENCOUNTER — Ambulatory Visit
Admission: RE | Admit: 2021-04-13 | Discharge: 2021-04-13 | Disposition: A | Discharge status: Home / Self Care | Payer: Medicare Other | Source: Ambulatory Visit | Attending: Radiation Oncology | Admitting: Radiation Oncology

## 2021-04-13 DIAGNOSIS — Z510 Encounter for antineoplastic radiation therapy: Secondary | ICD-10-CM | POA: Diagnosis not present

## 2021-04-14 ENCOUNTER — Ambulatory Visit
Admission: RE | Admit: 2021-04-14 | Discharge: 2021-04-14 | Disposition: A | Discharge status: Home / Self Care | Payer: Medicare Other | Source: Ambulatory Visit | Attending: Radiation Oncology | Admitting: Radiation Oncology

## 2021-04-14 DIAGNOSIS — Z510 Encounter for antineoplastic radiation therapy: Secondary | ICD-10-CM | POA: Diagnosis not present

## 2021-04-17 ENCOUNTER — Ambulatory Visit
Admission: RE | Admit: 2021-04-17 | Discharge: 2021-04-17 | Disposition: A | Discharge status: Home / Self Care | Payer: Medicare Other | Source: Ambulatory Visit | Attending: Radiation Oncology | Admitting: Radiation Oncology

## 2021-04-17 DIAGNOSIS — Z510 Encounter for antineoplastic radiation therapy: Secondary | ICD-10-CM | POA: Diagnosis not present

## 2021-04-18 ENCOUNTER — Ambulatory Visit
Admission: RE | Admit: 2021-04-18 | Discharge: 2021-04-18 | Disposition: A | Discharge status: Home / Self Care | Payer: Medicare Other | Source: Ambulatory Visit | Attending: Radiation Oncology | Admitting: Radiation Oncology

## 2021-04-18 DIAGNOSIS — Z510 Encounter for antineoplastic radiation therapy: Secondary | ICD-10-CM | POA: Diagnosis not present

## 2021-04-19 ENCOUNTER — Other Ambulatory Visit: Payer: Self-pay

## 2021-04-19 ENCOUNTER — Inpatient Hospital Stay (HOSPITAL_BASED_OUTPATIENT_CLINIC_OR_DEPARTMENT_OTHER): Payer: Medicare Other | Admitting: Oncology

## 2021-04-19 ENCOUNTER — Ambulatory Visit
Admission: RE | Admit: 2021-04-19 | Discharge: 2021-04-19 | Disposition: A | Discharge status: Home / Self Care | Payer: Medicare Other | Source: Ambulatory Visit | Attending: Radiation Oncology | Admitting: Radiation Oncology

## 2021-04-19 ENCOUNTER — Inpatient Hospital Stay: Payer: Medicare Other

## 2021-04-19 ENCOUNTER — Encounter: Payer: Self-pay | Admitting: Oncology

## 2021-04-19 VITALS — BP 100/61 | HR 95 | Temp 95.5°F | Ht 77.0 in | Wt 199.0 lb

## 2021-04-19 DIAGNOSIS — C155 Malignant neoplasm of lower third of esophagus: Secondary | ICD-10-CM

## 2021-04-19 DIAGNOSIS — E871 Hypo-osmolality and hyponatremia: Secondary | ICD-10-CM | POA: Diagnosis not present

## 2021-04-19 DIAGNOSIS — Z95828 Presence of other vascular implants and grafts: Secondary | ICD-10-CM

## 2021-04-19 DIAGNOSIS — Z510 Encounter for antineoplastic radiation therapy: Secondary | ICD-10-CM | POA: Diagnosis not present

## 2021-04-19 LAB — COMPREHENSIVE METABOLIC PANEL
ALT: 19 U/L (ref 0–44)
AST: 22 U/L (ref 15–41)
Albumin: 3.4 g/dL — ABNORMAL LOW (ref 3.5–5.0)
Alkaline Phosphatase: 86 U/L (ref 38–126)
Anion gap: 9 (ref 5–15)
BUN: 39 mg/dL — ABNORMAL HIGH (ref 8–23)
CO2: 25 mmol/L (ref 22–32)
Calcium: 9 mg/dL (ref 8.9–10.3)
Chloride: 98 mmol/L (ref 98–111)
Creatinine, Ser: 0.95 mg/dL (ref 0.61–1.24)
GFR, Estimated: >60 mL/min (ref >60)
Glucose, Bld: 239 mg/dL — ABNORMAL HIGH (ref 70–99)
Potassium: 4.5 mmol/L (ref 3.5–5.1)
Sodium: 132 mmol/L — ABNORMAL LOW (ref 135–145)
Total Bilirubin: 0.6 mg/dL (ref 0.3–1.2)
Total Protein: 6.4 g/dL — ABNORMAL LOW (ref 6.5–8.1)

## 2021-04-19 LAB — CBC WITH DIFFERENTIAL/PLATELET
Abs Immature Granulocytes: 0.03 10*3/uL (ref 0.00–0.07)
Basophils Absolute: 0 10*3/uL (ref 0.0–0.1)
Basophils Relative: 0 %
Eosinophils Absolute: 0.1 10*3/uL (ref 0.0–0.5)
Eosinophils Relative: 3 %
HCT: 30.1 % — ABNORMAL LOW (ref 39.0–52.0)
Hemoglobin: 10.7 g/dL — ABNORMAL LOW (ref 13.0–17.0)
Immature Granulocytes: 1 %
Lymphocytes Relative: 14 %
Lymphs Abs: 0.7 10*3/uL (ref 0.7–4.0)
MCH: 34.6 pg — ABNORMAL HIGH (ref 26.0–34.0)
MCHC: 35.5 g/dL (ref 30.0–36.0)
MCV: 97.4 fL (ref 80.0–100.0)
Monocytes Absolute: 0.6 10*3/uL (ref 0.1–1.0)
Monocytes Relative: 12 %
Neutro Abs: 3.4 10*3/uL (ref 1.7–7.7)
Neutrophils Relative %: 70 %
Platelets: 238 10*3/uL (ref 150–400)
RBC: 3.09 MIL/uL — ABNORMAL LOW (ref 4.22–5.81)
RDW: 14.6 % (ref 11.5–15.5)
WBC: 4.8 10*3/uL (ref 4.0–10.5)
nRBC: 0 % (ref 0.0–0.2)

## 2021-04-19 MED ORDER — HEPARIN SOD (PORK) LOCK FLUSH 100 UNIT/ML IV SOLN
INTRAVENOUS | Status: AC
  Filled 2021-04-19: qty 5

## 2021-04-19 MED ORDER — FAMOTIDINE 20 MG IN NS 100 ML IVPB
20.0000 mg | Freq: Once | INTRAVENOUS | Status: AC
  Administered 2021-04-19: 20 mg via INTRAVENOUS
  Filled 2021-04-19: qty 20

## 2021-04-19 MED ORDER — SODIUM CHLORIDE 0.9 % IV SOLN
50.0000 mg/m2 | Freq: Once | INTRAVENOUS | Status: AC
  Administered 2021-04-19: 12:00:00 108 mg via INTRAVENOUS
  Filled 2021-04-19: qty 18

## 2021-04-19 MED ORDER — SODIUM CHLORIDE 0.9 % IV SOLN
224.4000 mg | Freq: Once | INTRAVENOUS | Status: AC
  Administered 2021-04-19: 13:00:00 220 mg via INTRAVENOUS
  Filled 2021-04-19: qty 22

## 2021-04-19 MED ORDER — DIPHENHYDRAMINE HCL 50 MG/ML IJ SOLN
50.0000 mg | Freq: Once | INTRAMUSCULAR | Status: AC
  Administered 2021-04-19: 11:00:00 50 mg via INTRAVENOUS
  Filled 2021-04-19: qty 1

## 2021-04-19 MED ORDER — PALONOSETRON HCL INJECTION 0.25 MG/5ML
0.2500 mg | Freq: Once | INTRAVENOUS | Status: AC
  Administered 2021-04-19: 11:00:00 0.25 mg via INTRAVENOUS
  Filled 2021-04-19: qty 5

## 2021-04-19 MED ORDER — HEPARIN SOD (PORK) LOCK FLUSH 100 UNIT/ML IV SOLN
500.0000 [IU] | Freq: Once | INTRAVENOUS | Status: DC
  Filled 2021-04-19: qty 5

## 2021-04-19 MED ORDER — SODIUM CHLORIDE 0.9 % IV SOLN
Freq: Once | INTRAVENOUS | Status: AC
  Filled 2021-04-19: qty 250

## 2021-04-19 MED ORDER — SODIUM CHLORIDE 0.9% FLUSH
10.0000 mL | Freq: Once | INTRAVENOUS | Status: DC
  Filled 2021-04-19: qty 10

## 2021-04-19 MED ORDER — SODIUM CHLORIDE 0.9 % IV SOLN
10.0000 mg | Freq: Once | INTRAVENOUS | Status: AC
  Administered 2021-04-19: 12:00:00 10 mg via INTRAVENOUS
  Filled 2021-04-19: qty 10

## 2021-04-19 NOTE — Progress Notes (Addendum)
Nutrition Follow-up:  Patient with esophageal cancer.  Patient with PEG tube placed on 10/29.  Patient receiving chemotherapy and radiation.    Met with patient during infusion.  Patient started glucerna 1.5.  His son is giving 7 cartons per day via tube (2 cartons QID and 1 carton daily).  Has been using pedialyte.  Says that his last bowel movement was on Monday (watery, loose).  Says he will likely have one today.  Says that after treatment last week he had nausea but starting medication and it has improved.    Medications: reviewed  Labs: Na 132, glucose 239, BUN 39, creatinine 0.95  Anthropometrics:   Weight 199 lb today  199 lb on 12/7 197 lb on 11/30 193 lb 4 oz on 11/17 225 lb 11/2020   Re-Estimated Energy Needs  Kcals: 2250-2700 Protein: 108-135 g g Fluid: 2700-3150 ml  NUTRITION DIAGNOSIS: Inadequate oral intake relying on feeding tube   INTERVENTION:  Glucerna 1.5, 2 cartons at 8am, noon and 6pm, 1 carton at 10pm.  Flush with pedialyte 250m at each feeding plus 631mof water.  Give additional 24064mf water with medication administration.   Provides 2492 calories, 137 g protein, 2740 ml fluid. Written instructions given to patient.  RD called son and reviewed regimen.  Son verbalized understanding.      MONITORING, EVALUATION, GOAL: weight trends, tube feeding   NEXT VISIT: Wednesday, Dec 21 during infusion  Chattie Greeson B. AllZenia ResidesD,[place] Norden[place]NFort Yukongistered Dietitian [phone])

## 2021-04-19 NOTE — Progress Notes (Signed)
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  CT abdomen and pelvis without contrast in the ER showed mildly dilated distal esophagus with fluid content.  Underlying mass or stricture cannot be excluded.  There was no locoregional adenopathy or distant metastatic disease noted on CT abdomen.  Patient was seen by Dr. Verl Blalock and underwent EGD today.  Large fungating mass with no bleeding or stigmata of recent bleeding noted at the GE junction 40 cm from the incisors.  Mass was completely obstructing and circumferential.     Biopsy showed moderately differentiated adenocarcinoma.Head CT scan showed distal esophageal activity with an SUV of 4.1 along the proximal stomach and GE junction.  0.6 cm distal paraesophageal lymph node with an SUV of 1.8.  Enlarged right gastric lymph node close proximity to the tumor 1.2 cm with an SUV of 2.5.   There was a large pneumoperitoneum in the upper abdomen which was also evaluated by general surgery Dr. Peyton Najjar who had put in the PEG tube and subsequent study did not show any evidence of contrast leakage into the peritoneal cavity.   Interval history-states he tolerated cycle 1 well  and developed some nausea with cycle 2.  Is taking Ativan every 6-8 hours for nausea which seems to be helping.  He no longer is constipated and has every other day loose stools.  Tolerating tube feeds well.  Has added a liter of Pedialyte for low sodium level per our dietitian and additional free water.  Not taking anything by mouth at this time.  ECOG PS- 2 Pain scale- 0   Review of systems- Review of Systems  Constitutional:  Positive for malaise/fatigue.  Gastrointestinal:  Positive for diarrhea.  Neurological:  Positive for weakness.      Allergies  Allergen Reactions   Iodine    Other Other (See Comments)    Uncoded Allergy. 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[facility])      Past Surgical History:  Procedure Laterality Date   BACK SURGERY     CARDIAC CATHETERIZATION Left 11/02/2015   Procedure: Left Heart Cath and Coronary Angiography;  Surgeon: Isaias Cowman, MD;  Location: [place] CV LAB;  Service: Cardiovascular;  Laterality: Left;   ESOPHAGOGASTRODUODENOSCOPY (EGD) WITH PROPOFOL N/A 03/03/2021   Procedure: ESOPHAGOGASTRODUODENOSCOPY (EGD) WITH PROPOFOL;  Surgeon: [name], MD;  Location: [facility] ENDOSCOPY;  Service: Endoscopy;  Laterality: N/A;   GASTROSTOMY N/A 03/04/2021   Procedure: INSERTION OF GASTROSTOMY TUBE;  Surgeon: Herbert Pun, MD;  Location: [facility] ORS;  Service: General;  Laterality: N/A;   IR CM INJ ANY COLONIC TUBE W/FLUORO  03/17/2021   PORTACATH PLACEMENT Right 03/06/2021   Procedure: INSERTION PORT-A-CATH;  Surgeon: Herbert Pun, MD;  Location: [facility] ORS;  Service: General;  Laterality: Right;   SURGERY SCROTAL / TESTICULAR      Social History   Socioeconomic History    Marital status: Married    Spouse name: Not on file   Number of children: Not on file   Years of education: Not on file   Highest education level: Not on file  Occupational History   Occupation: retired Estate manager/land agent   Occupation: works part time now  Tobacco Use   Smoking status: Every Day    Packs/day: 1.00    Types: Cigarettes   Smokeless tobacco: Never  Vaping Use   Vaping Use: Never used  Substance and Sexual Activity   Alcohol use: Yes    Alcohol/week: 21.0 standard drinks    Types: 21 Glasses of wine per week   Drug use: No   Sexual activity: Not on file  Other Topics Concern   Not on file  Social History Narrative   Not on file   Social Determinants of Health   Financial Resource Strain: Not on file  Food Insecurity: Not on file  Transportation Needs: Not on file  Physical Activity: Not on file  Stress: Not on file  Social Connections: Not on file  Intimate Partner Violence: Not on file    Family History  Problem Relation Age of Onset   Coronary artery disease Father      Current Outpatient Medications:    aspirin 81 MG EC tablet, Take 1 tablet (81 mg total) by mouth daily., Disp: 30 tablet, Rfl: 3   atorvastatin (LIPITOR) 40 MG tablet, Take 1 tablet (40 mg total) by mouth daily at 6 PM., Disp: 30 tablet, Rfl: 3   lidocaine-prilocaine (EMLA) cream, Apply to affected area once, Disp: 30 g, Rfl: 3   lisinopril (ZESTRIL) 2.5 MG tablet, Take 2.5 mg by mouth daily., Disp: , Rfl:    LORazepam (ATIVAN) 0.5 MG tablet, Take 1 tablet (0.5 mg total) by mouth every 6 (six) hours as needed (Nausea or vomiting)., Disp: 30 tablet, Rfl: 0   metFORMIN (GLUCOPHAGE) 1000 MG tablet, Take 1 tablet (1,000 mg total) by mouth 2 (two) times daily with a meal. (Patient taking differently: Take 1,000 mg by mouth daily.), Disp: 60 tablet, Rfl: 3   Nutritional Supplements (FEEDING SUPPLEMENT, GLUCERNA 1.5 CAL,) LIQD, Give 2 cartons at 8am, 2 cartons at noon, 2 cartons at 6pm and 1  carton at 10pm (total 7 cartons/day).  Flush with 264m at first 3 feedings and 2420mat last feeding of the day., Disp: 1659 mL, Rfl: 2   ondansetron (ZOFRAN) 8 MG tablet, Take 1 tablet (8 mg total) by mouth 2 (two) times daily as needed for refractory nausea / vomiting. Start on day 3 after chemo., Disp: 30 tablet, Rfl: 1   prochlorperazine (COMPAZINE) 10 MG tablet, Take 1 tablet (10 mg total) by mouth every 6 (six) hours as needed (Nausea or vomiting)., Disp: 30 tablet, Rfl: 1   blood glucose meter kit and supplies KIT, Dispense based on patient and insurance preference. Use up to four times daily as directed. (FOR ICD-9 250.00, 250.01). (Patient not taking: Reported on 03/17/2021), Disp: 1 each, Rfl: 1   dexamethasone (DECADRON) 4 MG tablet, Take 2 tablets (8 mg total) by mouth daily. Start  the day after chemotherapy for 2 days. (Patient not taking: Reported on 04/05/2021), Disp: 30 tablet, Rfl: 1   Insulin Pen Needle 31G X 4 MM MISC, 1 pen by Does not apply route daily. Use with each dose and as directed (Patient not taking: Reported on 03/17/2021), Disp: 100 each, Rfl: 3   metoprolol succinate (TOPROL-XL) 25 MG 24 hr tablet, Take 25 mg by mouth daily. (Patient not taking: Reported on 03/17/2021), Disp: , Rfl:    metoprolol tartrate (LOPRESSOR) 25 MG tablet, Take 1 tablet (25 mg total) by mouth 2 (two) times daily. (Patient not taking: Reported on 03/17/2021), Disp: 60 tablet, Rfl: 3 No current facility-administered medications for this visit.  Facility-Administered Medications Ordered in Other Visits:    CARBOplatin (PARAPLATIN) 220 mg in sodium chloride 0.9 % 250 mL chemo infusion, 220 mg, Intravenous, Once, Sindy Guadeloupe, MD   dexamethasone (DECADRON) 10 mg in sodium chloride 0.9 % 50 mL IVPB, 10 mg, Intravenous, Once, Sindy Guadeloupe, MD   famotidine (PEPCID) IVPB 20 mg in NS 100 mL IVPB, 20 mg, Intravenous, Once, Sindy Guadeloupe, MD   PACLitaxel (TAXOL) 108 mg in sodium chloride 0.9 % 250 mL  chemo infusion (</= 12m/m2), 50 mg/m2 (Treatment Plan Recorded), Intravenous, Once, RSindy Guadeloupe MD  Physical exam:  Vitals:   04/19/21 1039  BP: 100/61  Pulse: 95  Temp: (!) 95.5 F (35.3 C)  TempSrc: Tympanic  SpO2: 100%  Weight: 199 lb (90.3 kg)  Height: _0  (1.956 m)   Physical Exam Cardiovascular:     Rate and Rhythm: Normal rate and regular rhythm.     Heart sounds: Normal heart sounds.  Pulmonary:     Effort: Pulmonary effort is normal.     Breath sounds: Normal breath sounds.  Abdominal:     General: Bowel sounds are normal.     Palpations: Abdomen is soft.     Comments: PEG tube in place  Skin:    General: Skin is warm and dry.  Neurological:     Mental Status: He is alert and oriented to person, place, and time.     CMP Latest Ref Rng & Units 04/19/2021  Glucose 70 - 99 mg/dL 239(H)  BUN 8 - 23 mg/dL 39(H)  Creatinine 0.61 - 1.24 mg/dL 0.95  Sodium 135 - 145 mmol/L 132(L)  Potassium 3.5 - 5.1 mmol/L 4.5  Chloride 98 - 111 mmol/L 98  CO2 22 - 32 mmol/L 25  Calcium 8.9 - 10.3 mg/dL 9.0  Total Protein 6.5 - 8.1 g/dL 6.4(L)  Total Bilirubin 0.3 - 1.2 mg/dL 0.6  Alkaline Phos 38 - 126 U/L 86  AST 15 - 41 U/L 22  ALT 0 - 44 U/L 19   CBC Latest Ref Rng & Units 04/19/2021  WBC 4.0 - 10.5 K/uL 4.8  Hemoglobin 13.0 - 17.0 g/dL 10.7(L)  Hematocrit 39.0 - 52.0 % 30.1(L)  Platelets 150 - 400 K/uL 238    No images are attached to the encounter.  No results found.   Assessment and plan- Patient is a 70y.o. male  with locally advanced GE junction adenocarcinoma T2/T3 N1 M0 stage III.  He is here for cycle 3 of neoadjuvant CarboTaxol chemotherapy with concurrent radiation.  Counts okay to proceed with cycle 3 of treatment today.  He appears to be tolerating radiation well with only mild loose stools every other day.  Blood pressure is stable.  He is currently not taking his metoprolol.   Hyponatremia- Appears  to be tolerating PEG tube feedings well.  He  meets with our dietitian frequently and was instructed to increase water and start a liter of Pedialyte daily.  Sodium level has improved and is 132 today.  Continue per dietitian.  Plan is for PET scan after concurrent chemoradiation is completed.  Has an upcoming appointment with [facility] thoracic surgery within the month.  Disposition- Treatment today.  Return to clinic as scheduled to see Dr. Janese Banks and for daily radiation.  Visit Diagnosis 1. Malignant neoplasm of lower third of esophagus (HCC)   2. Sodium (Na) deficiency    I spent 25 minutes dedicated to the care of this patient (face-to-face and non-face-to-face) on the date of the encounter to include what is described in the assessment and plan.  Faythe Casa, NP 04/19/2021 11:28 AM  11:28 AM

## 2021-04-19 NOTE — Patient Instructions (Signed)
[facility] [place]  Discharge Instructions: °Thank you for choosing [facility] to provide your oncology and hematology care.  °If you have a lab appointment with the Cancer Center, please go directly to the Cancer Center and check in at the registration area. ° °Wear comfortable clothing and clothing appropriate for easy access to any Portacath or PICC line.  ° °We strive to give you quality time with your provider. You may need to reschedule your appointment if you arrive late (15 or more minutes).  Arriving late affects you and other patients whose appointments are after yours.  Also, if you miss three or more appointments without notifying the office, you may be dismissed from the clinic at the provider’s discretion.    °  °For prescription refill requests, have your pharmacy contact our office and allow 72 hours for refills to be completed.   ° °Today you received the following chemotherapy and/or immunotherapy agents : Taxol / Carboplatin   °  °To help prevent nausea and vomiting after your treatment, we encourage you to take your nausea medication as directed. ° °BELOW ARE SYMPTOMS THAT SHOULD BE REPORTED IMMEDIATELY: °*FEVER GREATER THAN 100.4 F (38 °C) OR HIGHER °*CHILLS OR SWEATING °*NAUSEA AND VOMITING THAT IS NOT CONTROLLED WITH YOUR NAUSEA MEDICATION °*UNUSUAL SHORTNESS OF BREATH °*UNUSUAL BRUISING OR BLEEDING °*URINARY PROBLEMS (pain or burning when urinating, or frequent urination) °*BOWEL PROBLEMS (unusual diarrhea, constipation, pain near the anus) °TENDERNESS IN MOUTH AND THROAT WITH OR WITHOUT PRESENCE OF ULCERS (sore throat, sores in mouth, or a toothache) °UNUSUAL RASH, SWELLING OR PAIN  °UNUSUAL VAGINAL DISCHARGE OR ITCHING  ° °Items with * indicate a potential emergency and should be followed up as soon as possible or go to the Emergency Department if any problems should occur. ° °Please show the CHEMOTHERAPY ALERT CARD or IMMUNOTHERAPY ALERT CARD at  check-in to the Emergency Department and triage nurse. ° °Should you have questions after your visit or need to cancel or reschedule your appointment, please contact [facility]  [phone] and follow the prompts.  Office hours are 8:00 a.m. to 4:30 p.m. Monday - Friday. Please note that voicemails left after 4:00 p.m. may not be returned until the following business day.  We are closed weekends and major holidays. You have access to a nurse at all times for urgent questions. Please call the main number to the clinic [phone] and follow the prompts. ° °For any non-urgent questions, you may also contact your provider using MyChart. We now offer e-Visits for anyone 18 and older to request care online for non-urgent symptoms. For details visit [email or web address]. °  °Also download the MyChart app! Go to the app store, search "MyChart", open the app, select [place] [place], and log in with your MyChart username and password. ° °Due to Covid, a mask is required upon entering the hospital/clinic. If you do not have a mask, one will be given to you upon arrival. For doctor visits, patients may have 1 support person aged 18 or older with them. For treatment visits, patients cannot have anyone with them due to current Covid guidelines and our immunocompromised population.  °

## 2021-04-19 NOTE — Progress Notes (Signed)
Pt states he needs a glucometer ordered. Also, having diarrhea.

## 2021-04-20 ENCOUNTER — Ambulatory Visit
Admission: RE | Admit: 2021-04-20 | Discharge: 2021-04-20 | Disposition: A | Discharge status: Home / Self Care | Payer: Medicare Other | Source: Ambulatory Visit | Attending: Radiation Oncology | Admitting: Radiation Oncology

## 2021-04-20 DIAGNOSIS — Z510 Encounter for antineoplastic radiation therapy: Secondary | ICD-10-CM | POA: Diagnosis not present

## 2021-04-21 ENCOUNTER — Ambulatory Visit
Admission: RE | Admit: 2021-04-21 | Discharge: 2021-04-21 | Disposition: A | Discharge status: Home / Self Care | Payer: Medicare Other | Source: Ambulatory Visit | Attending: Radiation Oncology | Admitting: Radiation Oncology

## 2021-04-21 DIAGNOSIS — Z510 Encounter for antineoplastic radiation therapy: Secondary | ICD-10-CM | POA: Diagnosis not present

## 2021-04-24 ENCOUNTER — Ambulatory Visit
Admission: RE | Admit: 2021-04-24 | Discharge: 2021-04-24 | Disposition: A | Discharge status: Home / Self Care | Payer: Medicare Other | Source: Ambulatory Visit | Attending: Radiation Oncology | Admitting: Radiation Oncology

## 2021-04-24 DIAGNOSIS — Z510 Encounter for antineoplastic radiation therapy: Secondary | ICD-10-CM | POA: Diagnosis not present

## 2021-04-25 ENCOUNTER — Telehealth: Payer: Self-pay

## 2021-04-25 ENCOUNTER — Ambulatory Visit
Admission: RE | Admit: 2021-04-25 | Discharge: 2021-04-25 | Disposition: A | Discharge status: Home / Self Care | Payer: Medicare Other | Source: Ambulatory Visit | Attending: Radiation Oncology | Admitting: Radiation Oncology

## 2021-04-25 DIAGNOSIS — Z510 Encounter for antineoplastic radiation therapy: Secondary | ICD-10-CM | POA: Diagnosis not present

## 2021-04-25 NOTE — Telephone Encounter (Signed)
Nutrition  Spoke with son, Heron Sabins and answered questions that he had regarding patient.   Tiffany Calmes B. Zenia Resides, [place], [place] Registered Dietitian [phone] (mobile)

## 2021-04-25 NOTE — Telephone Encounter (Signed)
Nutrition  Son called RD and left message.  Delay in getting back to son due to RD being out of the office.  RD called son back but no answer. Left message with call back number.  Alexander Tran B. Zenia Resides, [place], [employer] Registered Dietitian [phone] (mobile)

## 2021-04-26 ENCOUNTER — Other Ambulatory Visit: Payer: Self-pay

## 2021-04-26 ENCOUNTER — Ambulatory Visit
Admission: RE | Admit: 2021-04-26 | Discharge: 2021-04-26 | Disposition: A | Discharge status: Home / Self Care | Payer: Medicare Other | Source: Ambulatory Visit | Attending: Radiation Oncology | Admitting: Radiation Oncology

## 2021-04-26 ENCOUNTER — Inpatient Hospital Stay: Payer: Medicare Other

## 2021-04-26 VITALS — BP 111/57 | HR 67 | Temp 97.5°F | Resp 16 | Wt 201.2 lb

## 2021-04-26 DIAGNOSIS — C155 Malignant neoplasm of lower third of esophagus: Secondary | ICD-10-CM

## 2021-04-26 DIAGNOSIS — Z510 Encounter for antineoplastic radiation therapy: Secondary | ICD-10-CM | POA: Diagnosis not present

## 2021-04-26 DIAGNOSIS — Z95828 Presence of other vascular implants and grafts: Secondary | ICD-10-CM

## 2021-04-26 LAB — CBC WITH DIFFERENTIAL/PLATELET
Abs Immature Granulocytes: 0.05 10*3/uL (ref 0.00–0.07)
Basophils Absolute: 0 10*3/uL (ref 0.0–0.1)
Basophils Relative: 1 %
Eosinophils Absolute: 0.1 10*3/uL (ref 0.0–0.5)
Eosinophils Relative: 1 %
HCT: 29.4 % — ABNORMAL LOW (ref 39.0–52.0)
Hemoglobin: 10.6 g/dL — ABNORMAL LOW (ref 13.0–17.0)
Immature Granulocytes: 1 %
Lymphocytes Relative: 11 %
Lymphs Abs: 0.5 10*3/uL — ABNORMAL LOW (ref 0.7–4.0)
MCH: 35.3 pg — ABNORMAL HIGH (ref 26.0–34.0)
MCHC: 36.1 g/dL — ABNORMAL HIGH (ref 30.0–36.0)
MCV: 98 fL (ref 80.0–100.0)
Monocytes Absolute: 0.6 10*3/uL (ref 0.1–1.0)
Monocytes Relative: 12 %
Neutro Abs: 3.8 10*3/uL (ref 1.7–7.7)
Neutrophils Relative %: 74 %
Platelets: 215 10*3/uL (ref 150–400)
RBC: 3 MIL/uL — ABNORMAL LOW (ref 4.22–5.81)
RDW: 14.9 % (ref 11.5–15.5)
WBC: 5.1 10*3/uL (ref 4.0–10.5)
nRBC: 0 % (ref 0.0–0.2)

## 2021-04-26 LAB — COMPREHENSIVE METABOLIC PANEL
ALT: 20 U/L (ref 0–44)
AST: 21 U/L (ref 15–41)
Albumin: 3.4 g/dL — ABNORMAL LOW (ref 3.5–5.0)
Alkaline Phosphatase: 77 U/L (ref 38–126)
Anion gap: 11 (ref 5–15)
BUN: 41 mg/dL — ABNORMAL HIGH (ref 8–23)
CO2: 24 mmol/L (ref 22–32)
Calcium: 9.3 mg/dL (ref 8.9–10.3)
Chloride: 96 mmol/L — ABNORMAL LOW (ref 98–111)
Creatinine, Ser: 0.89 mg/dL (ref 0.61–1.24)
GFR, Estimated: >60 mL/min (ref >60)
Glucose, Bld: 229 mg/dL — ABNORMAL HIGH (ref 70–99)
Potassium: 4.3 mmol/L (ref 3.5–5.1)
Sodium: 131 mmol/L — ABNORMAL LOW (ref 135–145)
Total Bilirubin: 0.5 mg/dL (ref 0.3–1.2)
Total Protein: 6.4 g/dL — ABNORMAL LOW (ref 6.5–8.1)

## 2021-04-26 MED ORDER — SODIUM CHLORIDE 0.9% FLUSH
10.0000 mL | Freq: Once | INTRAVENOUS | Status: DC
  Filled 2021-04-26: qty 10

## 2021-04-26 MED ORDER — SODIUM CHLORIDE 0.9 % IV SOLN
50.0000 mg/m2 | Freq: Once | INTRAVENOUS | Status: AC
  Administered 2021-04-26: 11:00:00 108 mg via INTRAVENOUS
  Filled 2021-04-26: qty 18

## 2021-04-26 MED ORDER — SODIUM CHLORIDE 0.9% FLUSH
10.0000 mL | INTRAVENOUS | Status: DC | PRN
  Filled 2021-04-26: qty 10

## 2021-04-26 MED ORDER — HEPARIN SOD (PORK) LOCK FLUSH 100 UNIT/ML IV SOLN
500.0000 [IU] | Freq: Once | INTRAVENOUS | Status: DC
  Filled 2021-04-26: qty 5

## 2021-04-26 MED ORDER — PALONOSETRON HCL INJECTION 0.25 MG/5ML
0.2500 mg | Freq: Once | INTRAVENOUS | Status: AC
  Administered 2021-04-26: 10:00:00 0.25 mg via INTRAVENOUS
  Filled 2021-04-26: qty 5

## 2021-04-26 MED ORDER — SODIUM CHLORIDE 0.9 % IV SOLN
224.4000 mg | Freq: Once | INTRAVENOUS | Status: AC
  Administered 2021-04-26: 13:00:00 220 mg via INTRAVENOUS
  Filled 2021-04-26: qty 22

## 2021-04-26 MED ORDER — DIPHENHYDRAMINE HCL 50 MG/ML IJ SOLN
50.0000 mg | Freq: Once | INTRAMUSCULAR | Status: AC
  Administered 2021-04-26: 10:00:00 50 mg via INTRAVENOUS
  Filled 2021-04-26: qty 1

## 2021-04-26 MED ORDER — HEPARIN SOD (PORK) LOCK FLUSH 100 UNIT/ML IV SOLN
500.0000 [IU] | Freq: Once | INTRAVENOUS | Status: AC | PRN
  Filled 2021-04-26: qty 5

## 2021-04-26 MED ORDER — SODIUM CHLORIDE 0.9 % IV SOLN
10.0000 mg | Freq: Once | INTRAVENOUS | Status: AC
  Administered 2021-04-26: 11:00:00 10 mg via INTRAVENOUS
  Filled 2021-04-26: qty 10

## 2021-04-26 MED ORDER — FAMOTIDINE 20 MG IN NS 100 ML IVPB
20.0000 mg | Freq: Once | INTRAVENOUS | Status: AC
  Administered 2021-04-26: 20 mg via INTRAVENOUS
  Filled 2021-04-26: qty 20

## 2021-04-26 MED ORDER — HEPARIN SOD (PORK) LOCK FLUSH 100 UNIT/ML IV SOLN
INTRAVENOUS | Status: AC
  Administered 2021-04-26: 13:00:00 500 [IU]
  Filled 2021-04-26: qty 5

## 2021-04-26 MED ORDER — SODIUM CHLORIDE 0.9 % IV SOLN
Freq: Once | INTRAVENOUS | Status: AC
  Filled 2021-04-26: qty 250

## 2021-04-26 NOTE — Patient Instructions (Signed)
[facility] [place]  Discharge Instructions: °Thank you for choosing [facility] to provide your oncology and hematology care.  °If you have a lab appointment with the Cancer Center, please go directly to the Cancer Center and check in at the registration area. ° °Wear comfortable clothing and clothing appropriate for easy access to any Portacath or PICC line.  ° °We strive to give you quality time with your provider. You may need to reschedule your appointment if you arrive late (15 or more minutes).  Arriving late affects you and other patients whose appointments are after yours.  Also, if you miss three or more appointments without notifying the office, you may be dismissed from the clinic at the provider’s discretion.    °  °For prescription refill requests, have your pharmacy contact our office and allow 72 hours for refills to be completed.   ° °Today you received the following chemotherapy and/or immunotherapy agents Carboplatin &Taxol    °  °To help prevent nausea and vomiting after your treatment, we encourage you to take your nausea medication as directed. ° °BELOW ARE SYMPTOMS THAT SHOULD BE REPORTED IMMEDIATELY: °*FEVER GREATER THAN 100.4 F (38 °C) OR HIGHER °*CHILLS OR SWEATING °*NAUSEA AND VOMITING THAT IS NOT CONTROLLED WITH YOUR NAUSEA MEDICATION °*UNUSUAL SHORTNESS OF BREATH °*UNUSUAL BRUISING OR BLEEDING °*URINARY PROBLEMS (pain or burning when urinating, or frequent urination) °*BOWEL PROBLEMS (unusual diarrhea, constipation, pain near the anus) °TENDERNESS IN MOUTH AND THROAT WITH OR WITHOUT PRESENCE OF ULCERS (sore throat, sores in mouth, or a toothache) °UNUSUAL RASH, SWELLING OR PAIN  °UNUSUAL VAGINAL DISCHARGE OR ITCHING  ° °Items with * indicate a potential emergency and should be followed up as soon as possible or go to the Emergency Department if any problems should occur. ° °Please show the CHEMOTHERAPY ALERT CARD or IMMUNOTHERAPY ALERT CARD at  check-in to the Emergency Department and triage nurse. ° °Should you have questions after your visit or need to cancel or reschedule your appointment, please contact [facility]  [phone] and follow the prompts.  Office hours are 8:00 a.m. to 4:30 p.m. Monday - Friday. Please note that voicemails left after 4:00 p.m. may not be returned until the following business day.  We are closed weekends and major holidays. You have access to a nurse at all times for urgent questions. Please call the main number to the clinic [phone] and follow the prompts. ° °For any non-urgent questions, you may also contact your provider using MyChart. We now offer e-Visits for anyone 18 and older to request care online for non-urgent symptoms. For details visit [email or web address]. °  °Also download the MyChart app! Go to the app store, search "MyChart", open the app, select [facility], and log in with your MyChart username and password. ° °Due to Covid, a mask is required upon entering the hospital/clinic. If you do not have a mask, one will be given to you upon arrival. For doctor visits, patients may have 1 support person aged 18 or older with them. For treatment visits, patients cannot have anyone with them due to current Covid guidelines and our immunocompromised population.  °

## 2021-04-26 NOTE — Progress Notes (Signed)
Nutrition Follow-up:  Patient with esophageal cancer.  Patient with PEG tube placed on 10/29.  Patient receiving chemotherapy and radiation.   Met with patient during infusion.  Patient reports one episode of dry heaves yesterday after radiation.  Taking nausea medication in the am and pm (?? Zofran).  Reports bowels are more solid, not as loose as before.  Last bowel movement day before yesterday.  Denies any problems with tube feeding.  Patient giving 7 cartons of glucerna 1.5 daily, pedialyte and water with medications.  Son feeds him.     Medications: reviewed  Labs: Na 131, glucose 229  Anthropometrics:   Weight 201 lb today  199 lb on 12/14 199 lb on 12/7 193 lb on 11/17 225 lb on 11/2020   Estimated Energy Needs  Kcals: 2250-2700 Protein: 108-135 g Fluid: 2700-3150  NUTRITION DIAGNOSIS: Inadequate oral intake relying on feeding tube   INTERVENTION:  Glucerna 1.5, 2 cartons at 8am, noon, and 6pm and 1 carton at 10pm.  Flush with pedialyte 273m at each feeding plus 685mof water. Give additional 24036mf water with medication administration.  Provides 2492 calories, 137 g protein, 2740m27muid.   Patient denies questions or concerns at this time regarding feeding.  Patient says that he still has not gotten new glucose meter. Secure chat sent to covering provider regarding meter.       MONITORING, EVALUATION, GOAL: weight trends, intake   NEXT VISIT: Wednesday, Dec 28 during infusion  Sharah Finnell B. AlleZenia Resides, SatsopN Casas Adobesistered Dietitian [phone])

## 2021-04-27 ENCOUNTER — Ambulatory Visit
Admission: RE | Admit: 2021-04-27 | Discharge: 2021-04-27 | Disposition: A | Discharge status: Home / Self Care | Payer: Medicare Other | Source: Ambulatory Visit | Attending: Radiation Oncology | Admitting: Radiation Oncology

## 2021-04-27 DIAGNOSIS — Z510 Encounter for antineoplastic radiation therapy: Secondary | ICD-10-CM | POA: Diagnosis not present

## 2021-04-28 ENCOUNTER — Ambulatory Visit
Admission: RE | Admit: 2021-04-28 | Discharge: 2021-04-28 | Disposition: A | Discharge status: Home / Self Care | Payer: Medicare Other | Source: Ambulatory Visit | Attending: Radiation Oncology | Admitting: Radiation Oncology

## 2021-04-28 ENCOUNTER — Telehealth: Payer: Self-pay

## 2021-04-28 DIAGNOSIS — Z510 Encounter for antineoplastic radiation therapy: Secondary | ICD-10-CM | POA: Diagnosis not present

## 2021-04-28 NOTE — Telephone Encounter (Signed)
Alexander Tran forgot about his appointment on 12/14 with thoracic surgeon. Referral resent. They should contact him with appointment.

## 2021-05-02 ENCOUNTER — Ambulatory Visit
Admission: RE | Admit: 2021-05-02 | Discharge: 2021-05-02 | Disposition: A | Discharge status: Home / Self Care | Payer: Medicare Other | Source: Ambulatory Visit | Attending: Radiation Oncology | Admitting: Radiation Oncology

## 2021-05-02 DIAGNOSIS — Z510 Encounter for antineoplastic radiation therapy: Secondary | ICD-10-CM | POA: Diagnosis not present

## 2021-05-03 ENCOUNTER — Inpatient Hospital Stay: Payer: Medicare Other

## 2021-05-03 ENCOUNTER — Inpatient Hospital Stay (HOSPITAL_BASED_OUTPATIENT_CLINIC_OR_DEPARTMENT_OTHER): Payer: Medicare Other | Admitting: Oncology

## 2021-05-03 ENCOUNTER — Other Ambulatory Visit: Payer: Self-pay | Admitting: *Deleted

## 2021-05-03 ENCOUNTER — Other Ambulatory Visit: Payer: Self-pay

## 2021-05-03 ENCOUNTER — Encounter: Payer: Self-pay | Admitting: Oncology

## 2021-05-03 ENCOUNTER — Ambulatory Visit
Admission: RE | Admit: 2021-05-03 | Discharge: 2021-05-03 | Disposition: A | Discharge status: Home / Self Care | Payer: Medicare Other | Source: Ambulatory Visit | Attending: Radiation Oncology | Admitting: Radiation Oncology

## 2021-05-03 VITALS — BP 140/71 | HR 69

## 2021-05-03 VITALS — BP 82/51 | HR 104 | Temp 97.3°F | Resp 16 | Wt 201.0 lb

## 2021-05-03 DIAGNOSIS — C155 Malignant neoplasm of lower third of esophagus: Secondary | ICD-10-CM

## 2021-05-03 DIAGNOSIS — E860 Dehydration: Secondary | ICD-10-CM

## 2021-05-03 DIAGNOSIS — Z510 Encounter for antineoplastic radiation therapy: Secondary | ICD-10-CM | POA: Diagnosis not present

## 2021-05-03 DIAGNOSIS — Z5111 Encounter for antineoplastic chemotherapy: Secondary | ICD-10-CM

## 2021-05-03 DIAGNOSIS — R110 Nausea: Secondary | ICD-10-CM

## 2021-05-03 LAB — COMPREHENSIVE METABOLIC PANEL
ALT: 23 U/L (ref 0–44)
AST: 21 U/L (ref 15–41)
Albumin: 3.6 g/dL (ref 3.5–5.0)
Alkaline Phosphatase: 78 U/L (ref 38–126)
Anion gap: 10 (ref 5–15)
BUN: 41 mg/dL — ABNORMAL HIGH (ref 8–23)
CO2: 23 mmol/L (ref 22–32)
Calcium: 9.1 mg/dL (ref 8.9–10.3)
Chloride: 97 mmol/L — ABNORMAL LOW (ref 98–111)
Creatinine, Ser: 0.91 mg/dL (ref 0.61–1.24)
GFR, Estimated: >60 mL/min (ref >60)
Glucose, Bld: 273 mg/dL — ABNORMAL HIGH (ref 70–99)
Potassium: 4.7 mmol/L (ref 3.5–5.1)
Sodium: 130 mmol/L — ABNORMAL LOW (ref 135–145)
Total Bilirubin: 0.8 mg/dL (ref 0.3–1.2)
Total Protein: 6.3 g/dL — ABNORMAL LOW (ref 6.5–8.1)

## 2021-05-03 LAB — CBC WITH DIFFERENTIAL/PLATELET
Abs Immature Granulocytes: 0.03 10*3/uL (ref 0.00–0.07)
Basophils Absolute: 0 10*3/uL (ref 0.0–0.1)
Basophils Relative: 1 %
Eosinophils Absolute: 0.1 10*3/uL (ref 0.0–0.5)
Eosinophils Relative: 1 %
HCT: 28.4 % — ABNORMAL LOW (ref 39.0–52.0)
Hemoglobin: 10.4 g/dL — ABNORMAL LOW (ref 13.0–17.0)
Immature Granulocytes: 1 %
Lymphocytes Relative: 11 %
Lymphs Abs: 0.5 10*3/uL — ABNORMAL LOW (ref 0.7–4.0)
MCH: 35.7 pg — ABNORMAL HIGH (ref 26.0–34.0)
MCHC: 36.6 g/dL — ABNORMAL HIGH (ref 30.0–36.0)
MCV: 97.6 fL (ref 80.0–100.0)
Monocytes Absolute: 0.5 10*3/uL (ref 0.1–1.0)
Monocytes Relative: 11 %
Neutro Abs: 3.5 10*3/uL (ref 1.7–7.7)
Neutrophils Relative %: 75 %
Platelets: 128 10*3/uL — ABNORMAL LOW (ref 150–400)
RBC: 2.91 MIL/uL — ABNORMAL LOW (ref 4.22–5.81)
RDW: 15.2 % (ref 11.5–15.5)
WBC: 4.5 10*3/uL (ref 4.0–10.5)
nRBC: 0 % (ref 0.0–0.2)

## 2021-05-03 MED ORDER — PROCHLORPERAZINE EDISYLATE 10 MG/2ML IJ SOLN
10.0000 mg | Freq: Once | INTRAMUSCULAR | Status: AC
  Administered 2021-05-03: 11:00:00 10 mg via INTRAVENOUS
  Filled 2021-05-03: qty 2

## 2021-05-03 MED ORDER — SODIUM CHLORIDE 0.9 % IV SOLN
Freq: Once | INTRAVENOUS | Status: AC
  Filled 2021-05-03: qty 250

## 2021-05-03 MED ORDER — PALONOSETRON HCL INJECTION 0.25 MG/5ML
0.2500 mg | Freq: Once | INTRAVENOUS | Status: AC
  Administered 2021-05-03: 12:00:00 0.25 mg via INTRAVENOUS
  Filled 2021-05-03: qty 5

## 2021-05-03 MED ORDER — FAMOTIDINE IN NACL 20-0.9 MG/50ML-% IV SOLN
20.0000 mg | Freq: Two times a day (BID) | INTRAVENOUS | Status: DC
  Administered 2021-05-03: 13:00:00 20 mg via INTRAVENOUS
  Filled 2021-05-03 (×2): qty 50

## 2021-05-03 MED ORDER — FAMOTIDINE 20 MG IN NS 100 ML IVPB
20.0000 mg | Freq: Once | INTRAVENOUS | Status: DC
  Filled 2021-05-03: qty 100

## 2021-05-03 MED ORDER — SODIUM CHLORIDE 0.9 % IV SOLN
224.4000 mg | Freq: Once | INTRAVENOUS | Status: AC
  Administered 2021-05-03: 14:00:00 220 mg via INTRAVENOUS
  Filled 2021-05-03: qty 22

## 2021-05-03 MED ORDER — SODIUM CHLORIDE 0.9 % IV SOLN
10.0000 mg | Freq: Once | INTRAVENOUS | Status: AC
  Administered 2021-05-03: 13:00:00 10 mg via INTRAVENOUS
  Filled 2021-05-03: qty 1

## 2021-05-03 MED ORDER — DIPHENHYDRAMINE HCL 50 MG/ML IJ SOLN
50.0000 mg | Freq: Once | INTRAMUSCULAR | Status: AC
  Administered 2021-05-03: 12:00:00 50 mg via INTRAVENOUS
  Filled 2021-05-03: qty 1

## 2021-05-03 MED ORDER — SODIUM CHLORIDE 0.9 % IV SOLN
50.0000 mg/m2 | Freq: Once | INTRAVENOUS | Status: AC
  Administered 2021-05-03: 13:00:00 108 mg via INTRAVENOUS
  Filled 2021-05-03: qty 18

## 2021-05-03 MED ORDER — HEPARIN SOD (PORK) LOCK FLUSH 100 UNIT/ML IV SOLN
500.0000 [IU] | Freq: Once | INTRAVENOUS | Status: AC | PRN
  Administered 2021-05-03: 15:00:00 500 [IU]
  Filled 2021-05-03: qty 5

## 2021-05-03 NOTE — Patient Instructions (Signed)
[facility] [place]  Discharge Instructions: ?Thank you for choosing [facility] to provide your oncology and hematology care.  ?If you have a lab appointment with the Cancer Center, please go directly to the Cancer Center and check in at the registration area. ? ?Wear comfortable clothing and clothing appropriate for easy access to any Portacath or PICC line.  ? ?We strive to give you quality time with your provider. You may need to reschedule your appointment if you arrive late (15 or more minutes).  Arriving late affects you and other patients whose appointments are after yours.  Also, if you miss three or more appointments without notifying the office, you may be dismissed from the clinic at the provider?s discretion.    ?  ?For prescription refill requests, have your pharmacy contact our office and allow 72 hours for refills to be completed.   ? ?  ?To help prevent nausea and vomiting after your treatment, we encourage you to take your nausea medication as directed. ? ?BELOW ARE SYMPTOMS THAT SHOULD BE REPORTED IMMEDIATELY: ?*FEVER GREATER THAN 100.4 F (38 ?C) OR HIGHER ?*CHILLS OR SWEATING ?*NAUSEA AND VOMITING THAT IS NOT CONTROLLED WITH YOUR NAUSEA MEDICATION ?*UNUSUAL SHORTNESS OF BREATH ?*UNUSUAL BRUISING OR BLEEDING ?*URINARY PROBLEMS (pain or burning when urinating, or frequent urination) ?*BOWEL PROBLEMS (unusual diarrhea, constipation, pain near the anus) ?TENDERNESS IN MOUTH AND THROAT WITH OR WITHOUT PRESENCE OF ULCERS (sore throat, sores in mouth, or a toothache) ?UNUSUAL RASH, SWELLING OR PAIN  ?UNUSUAL VAGINAL DISCHARGE OR ITCHING  ? ?Items with * indicate a potential emergency and should be followed up as soon as possible or go to the Emergency Department if any problems should occur. ? ?Please show the CHEMOTHERAPY ALERT CARD or IMMUNOTHERAPY ALERT CARD at check-in to the Emergency Department and triage nurse. ? ?Should you have questions after your visit  or need to cancel or reschedule your appointment, please contact [facility]  [phone] and follow the prompts.  Office hours are 8:00 a.m. to 4:30 p.m. Monday - Friday. Please note that voicemails left after 4:00 p.m. may not be returned until the following business day.  We are closed weekends and major holidays. You have access to a nurse at all times for urgent questions. Please call the main number to the clinic [phone] and follow the prompts. ? ?For any non-urgent questions, you may also contact your provider using MyChart. We now offer e-Visits for anyone 18 and older to request care online for non-urgent symptoms. For details visit [email or web address]. ?  ?Also download the MyChart app! Go to the app store, search "MyChart", open the app, select [facility], and log in with your MyChart username and password. ? ?Due to Covid, a mask is required upon entering the hospital/clinic. If you do not have a mask, one will be given to you upon arrival. For doctor visits, patients may have 1 support person aged 18 or older with them. For treatment visits, patients cannot have anyone with them due to current Covid guidelines and our immunocompromised population.  ?

## 2021-05-03 NOTE — Progress Notes (Signed)
Pt in for follow up and treatment today.  Pt is very nauseous, took med at home this morning  Reports had one episode of diarrhea this morning at 3 am.

## 2021-05-03 NOTE — Progress Notes (Signed)
Nutrition Follow-up:  Patient with esophageal cancer.  Patient with PEG tube placed on 10/29.  Patient receiving chemotherapy and radiation.   Met with patient during infusion.  Patient reports some nausea this am.  Had 1 episode of diarrhea at 3am and several episodes about 4 days ago.  Says that he is tolerating mostly 7 cartons of glucerna 1.5 daily, some days may only get 6 cartons in but for the most part has not missed any.  Continues with pedialyte and water.  Patient says that MD asked him to hold blood pressure medication for the next few days.    Says that he tried some wonton soup and was able to drink the broth but vomited with small bite of noodle.     Medications: reviewed  Labs: Na 130, glucose 273, BUN 41, creatinine WNL.    Anthropometrics:   Weight 201 lb  stable from last week, increased overall  199 lb on 12/14 199 lb on 12/7 193 lb on 11/17  225 lb on 11/2020   Estimated Energy Needs  Kcals: 4917-9150 Protein: 108-135 g Fluid: 2700-3176m  NUTRITION DIAGNOSIS: Inadequate oral intake but relying on feeding tube   INTERVENTION:  Continue glucerna 1.5, 7 cartons daily.  Flush with pedialyte 2547mat each eeding plus 6019mf water.  Give additional 240m85m water with medication administration.  Provides 2492 calories, 137 g protein, 2740ml37mid.   Provided patient with phone number to reach [employer] supplies. Says he has about 2 cases left.       MONITORING, EVALUATION, GOAL: weights, tube feeding   NEXT VISIT: Wednesday, Jan 4 during infusion  Querida Beretta B. AllenZenia Resides [place] RRobardsstered Dietitian [phone])

## 2021-05-03 NOTE — Progress Notes (Signed)
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  CT abdomen and pelvis without contrast in the ER showed mildly dilated distal esophagus with fluid content.  Underlying mass or stricture cannot be excluded.  There was no locoregional adenopathy or distant metastatic disease noted on CT abdomen.  Patient was seen by Dr. Verl Blalock and underwent EGD today.  Large fungating mass with no bleeding or stigmata of recent bleeding noted at the GE junction 40 cm from the incisors.  Mass was completely obstructing and circumferential.     Biopsy showed moderately differentiated adenocarcinoma.Head CT scan showed distal esophageal activity with an SUV of 4.1 along the proximal stomach and GE junction.  0.6 cm distal paraesophageal lymph node with an SUV of 1.8.  Enlarged right gastric lymph node close proximity to the tumor 1.2 cm with an SUV of 2.5.   There was a large pneumoperitoneum in the upper abdomen which was also evaluated by general surgery Dr. Peyton Najjar who had put in the PEG tube and subsequent study did not show any evidence of contrast leakage into the peritoneal cavity.    Interval history-patient reports some nausea and 1  episode of diarrhea since today morning.  He is tolerating chemotherapy relatively well but continues to have fatigue.  He spends most of his time laying in bed and has not been doing much walking or activity.  PEG tube feeds are going on well.  ECOG PS- 2 Pain scale- 0 Opioid associated constipation- no  Review of systems- Review of Systems  Constitutional:  Positive for malaise/fatigue. Negative for chills, fever and weight loss.  HENT:  Negative for congestion, ear discharge and nosebleeds.   Eyes:  Negative for blurred vision.  Respiratory:  Negative for cough, hemoptysis, sputum production, shortness of breath and wheezing.   Cardiovascular:  Negative for chest pain, palpitations, orthopnea and claudication.  Gastrointestinal:  Negative for abdominal pain, blood in stool, constipation, diarrhea, heartburn, melena, nausea and vomiting.  Genitourinary:  Negative for dysuria, flank pain, frequency, hematuria and urgency.  Musculoskeletal:  Negative for back pain, joint pain and myalgias.  Skin:  Negative for rash.  Neurological:  Negative for dizziness, tingling, focal weakness, seizures, weakness and headaches.  Endo/Heme/Allergies:  Does not bruise/bleed easily.  Psychiatric/Behavioral:  Negative for depression and suicidal ideas. The patient does not have insomnia.     Allergies  Allergen Reactions   Iodine    Other Other (See Comments)    Uncoded Allergy. 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[facility])      Past Surgical History:  Procedure Laterality Date   BACK SURGERY     CARDIAC CATHETERIZATION Left 11/02/2015   Procedure: Left Heart Cath and Coronary Angiography;  Surgeon: Isaias Cowman, MD;  Location: [facility];  Service: Cardiovascular;  Laterality: Left;   ESOPHAGOGASTRODUODENOSCOPY (EGD) WITH PROPOFOL N/A 03/03/2021   Procedure: ESOPHAGOGASTRODUODENOSCOPY (EGD) WITH PROPOFOL;  Surgeon: [name], MD;  Location: [facility];  Service: Endoscopy;  Laterality: N/A;   GASTROSTOMY N/A 03/04/2021   Procedure: INSERTION OF GASTROSTOMY TUBE;  Surgeon: Herbert Pun, MD;  Location: [facility] ORS;  Service: General;  Laterality: N/A;   IR CM INJ ANY COLONIC TUBE W/FLUORO  03/17/2021   PORTACATH PLACEMENT Right 03/06/2021   Procedure: INSERTION PORT-A-CATH;  Surgeon: Herbert Pun, MD;  Location: [facility] ORS;  Service: General;  Laterality: Right;   SURGERY SCROTAL / TESTICULAR      Social History   Socioeconomic History   Marital status: Married    Spouse name: Not on file   Number of children: Not on file   Years of education: Not on file   Highest education level: Not on file  Occupational History   Occupation: retired Estate manager/land agent   Occupation: works part time now  Tobacco Use   Smoking status: Every Day    Packs/day: 1.00    Types: Cigarettes   Smokeless tobacco: Never  Vaping Use   Vaping Use: Never used  Substance and Sexual Activity   Alcohol use: Yes    Alcohol/week: 21.0 standard drinks    Types: 21 Glasses of wine per week   Drug use: No   Sexual activity: Not on file  Other Topics Concern   Not on file  Social History Narrative   Not on file   Social Determinants of Health   Financial Resource Strain: Not on file  Food Insecurity: Not on file  Transportation Needs: Not on file  Physical Activity: Not on file  Stress: Not on file  Social Connections: Not on file  Intimate Partner Violence: Not on file    Family History  Problem Relation Age of Onset   Coronary artery disease Father      Current Outpatient Medications:    aspirin 81 MG EC tablet, Take 1 tablet (81 mg total) by mouth daily., Disp: 30 tablet, Rfl: 3   atorvastatin (LIPITOR)  40 MG tablet, Take 1 tablet (40 mg total) by mouth daily at 6 PM., Disp: 30 tablet, Rfl: 3   blood glucose meter kit and supplies KIT, Dispense based on patient and insurance preference. Use up to four times daily as directed. (FOR ICD-9 250.00, 250.01). (Patient not taking: Reported on 03/17/2021), Disp: 1 each, Rfl: 1   dexamethasone (DECADRON) 4 MG tablet, Take 2 tablets (8 mg total) by mouth daily. Start the day after chemotherapy for 2 days. (Patient not taking: Reported on 04/05/2021), Disp: 30 tablet, Rfl: 1   Insulin Pen Needle 31G X 4 MM MISC, 1 pen by Does not apply route daily. Use with each dose and as directed (Patient not taking: Reported on 03/17/2021), Disp: 100 each, Rfl: 3   lidocaine-prilocaine (EMLA) cream, Apply to affected area once, Disp: 30 g, Rfl: 3   lisinopril (ZESTRIL) 2.5 MG tablet, Take 2.5 mg by mouth daily., Disp: , Rfl:    LORazepam (ATIVAN) 0.5 MG tablet, Take 1 tablet (0.5 mg total) by mouth every 6 (six) hours as needed (Nausea or vomiting)., Disp: 30 tablet, Rfl:  0   metFORMIN (GLUCOPHAGE) 1000 MG tablet, Take 1 tablet (1,000 mg total) by mouth 2 (two) times daily with a meal. (Patient taking differently: Take 1,000 mg by mouth daily.), Disp: 60 tablet, Rfl: 3   metoprolol succinate (TOPROL-XL) 25 MG 24 hr tablet, Take 25 mg by mouth daily. (Patient not taking: Reported on 03/17/2021), Disp: , Rfl:    metoprolol tartrate (LOPRESSOR) 25 MG tablet, Take 1 tablet (25 mg total) by mouth 2 (two) times daily. (Patient not taking: Reported on 03/17/2021), Disp: 60 tablet, Rfl: 3   Nutritional Supplements (FEEDING SUPPLEMENT, GLUCERNA 1.5 CAL,) LIQD, Give 2 cartons at 8am, 2 cartons at noon, 2 cartons at 6pm and 1 carton at 10pm (total 7 cartons/day).  Flush with 247m at first 3 feedings and 2432mat last feeding of the day., Disp: 1659 mL, Rfl: 2   ondansetron (ZOFRAN) 8 MG tablet, Take 1 tablet (8 mg total) by mouth 2 (two) times daily as needed for refractory nausea /  vomiting. Start on day 3 after chemo., Disp: 30 tablet, Rfl: 1   prochlorperazine (COMPAZINE) 10 MG tablet, Take 1 tablet (10 mg total) by mouth every 6 (six) hours as needed (Nausea or vomiting)., Disp: 30 tablet, Rfl: 1 No current facility-administered medications for this visit.  Facility-Administered Medications Ordered in Other Visits:    CARBOplatin (PARAPLATIN) 220 mg in sodium chloride 0.9 % 250 mL chemo infusion, 220 mg, Intravenous, Once, RaSindy GuadeloupeMD   dexamethasone (DECADRON) 10 mg in sodium chloride 0.9 % 50 mL IVPB, 10 mg, Intravenous, Once, RaSindy GuadeloupeMD, Last Rate: 204 mL/hr at 05/03/21 1231, 10 mg at 05/03/21 1231   famotidine (PEPCID) IVPB 20 mg premix, 20 mg, Intravenous, Q12H, Burns, Jennifer E, NP   heparin lock flush 100 unit/mL, 500 Units, Intracatheter, Once PRN, RaSindy GuadeloupeMD   PACLitaxel (TAXOL) 108 mg in sodium chloride 0.9 % 250 mL chemo infusion (</= 8063m2), 50 mg/m2 (Treatment Plan Recorded), Intravenous, Once, RaoSindy GuadeloupeD  Physical exam:  Vitals:   05/03/21 1037  BP: (!) 82/51  Pulse: (!) 104  Resp: 16  Temp: (!) 97.3 F (36.3 C)  TempSrc: Tympanic  SpO2: 100%  Weight: 201 lb (91.2 kg)   Physical Exam Constitutional:      General: He is not in acute distress. Cardiovascular:     Rate and Rhythm: Normal rate and regular rhythm.     Heart sounds: Normal heart sounds.  Pulmonary:     Effort: Pulmonary effort is normal.     Breath sounds: Normal breath sounds.  Abdominal:     General: Bowel sounds are normal.     Palpations: Abdomen is soft.     Comments: PEG tube in place  Skin:    General: Skin is warm and dry.  Neurological:     Mental Status: He is alert and oriented to person, place, and time.     CMP Latest Ref Rng & Units 05/03/2021  Glucose 70 - 99 mg/dL 273(H)  BUN 8 - 23 mg/dL 41(H)  Creatinine 0.61 - 1.24 mg/dL 0.91  Sodium 135 - 145 mmol/L 130(L)  Potassium 3.5 - 5.1 mmol/L 4.7  Chloride 98 - 111 mmol/L  97(L)  CO2 22 - 32 mmol/L 23  Calcium 8.9 - 10.3 mg/dL 9.1  Total Protein 6.5 - 8.1 g/dL 6.3(L)  Total Bilirubin 0.3 - 1.2 mg/dL 0.8  Alkaline Phos 38 - 126 U/L 78  AST 15 - 41 U/L 21  ALT 0 - 44 U/L 23   CBC Latest Ref Rng & Units 05/03/2021  WBC 4.0 - 10.5 K/uL 4.5  Hemoglobin 13.0 - 17.0 g/dL 10.4(L)  Hematocrit 39.0 - 52.0 % 28.4(L)  Platelets 150 - 400 K/uL 128(L)     Assessment and plan- Patient is a 70 y.o. 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  He is here for on treatment assessment prior to cycle 5 of neoadjuvant CarboTaxol chemotherapy concurrent with radiation  Counts okay to proceed with cycle 5 of CarboTaxol chemotherapy today.  He will directly receive cycle 6 last week which will be his last chemotherapy.  Patient is completing radiation treatment on 05/17/2021.  Plan is to repeat PET CT scan sometime around third week of February which would be roughly 6 weeks post concurrent chemoradiation.  Patient has an upcoming appointment at [facility] to see if he would be a surgical candidate.  I am concerned that he is still spending most of his time in bed and does not do much activity around the house.  I encouraged him to do some light exercises as well as walk around the house more.  Hypotension: We will give him 1 L of IV fluids today and I have asked him to hold off on taking his lisinopril for the next few days.  Normocytic anemia: Likely secondary to chemotherapy continue to monitor  Chemo induced nausea: Continue as needed nausea meds  I will tentatively see him back in 3 weeks with labs.  He will not receive any chemotherapy at that time   Visit Diagnosis 1. Encounter for antineoplastic chemotherapy   2. Malignant neoplasm of lower third of esophagus (HCC)      [name], MD, MPH [facility] [phone] 05/03/2021 12:34 PM

## 2021-05-04 ENCOUNTER — Ambulatory Visit
Admission: RE | Admit: 2021-05-04 | Discharge: 2021-05-04 | Disposition: A | Discharge status: Home / Self Care | Payer: Medicare Other | Source: Ambulatory Visit | Attending: Radiation Oncology | Admitting: Radiation Oncology

## 2021-05-04 DIAGNOSIS — Z510 Encounter for antineoplastic radiation therapy: Secondary | ICD-10-CM | POA: Diagnosis not present

## 2021-05-05 ENCOUNTER — Ambulatory Visit
Admission: RE | Admit: 2021-05-05 | Discharge: 2021-05-05 | Disposition: A | Discharge status: Home / Self Care | Payer: Medicare Other | Source: Ambulatory Visit | Attending: Radiation Oncology | Admitting: Radiation Oncology

## 2021-05-05 DIAGNOSIS — Z510 Encounter for antineoplastic radiation therapy: Secondary | ICD-10-CM | POA: Diagnosis not present

## 2021-05-09 ENCOUNTER — Ambulatory Visit
Admission: RE | Admit: 2021-05-09 | Discharge: 2021-05-09 | Disposition: A | Discharge status: Home / Self Care | Payer: Medicare Other | Source: Ambulatory Visit | Attending: Radiation Oncology | Admitting: Radiation Oncology

## 2021-05-09 DIAGNOSIS — Z510 Encounter for antineoplastic radiation therapy: Secondary | ICD-10-CM | POA: Insufficient documentation

## 2021-05-09 DIAGNOSIS — C155 Malignant neoplasm of lower third of esophagus: Secondary | ICD-10-CM | POA: Insufficient documentation

## 2021-05-10 ENCOUNTER — Inpatient Hospital Stay: Payer: Medicare Other

## 2021-05-10 ENCOUNTER — Ambulatory Visit
Admission: RE | Admit: 2021-05-10 | Discharge: 2021-05-10 | Disposition: A | Discharge status: Home / Self Care | Payer: Medicare Other | Source: Ambulatory Visit | Attending: Radiation Oncology | Admitting: Radiation Oncology

## 2021-05-10 DIAGNOSIS — C155 Malignant neoplasm of lower third of esophagus: Secondary | ICD-10-CM | POA: Diagnosis not present

## 2021-05-10 DIAGNOSIS — Z510 Encounter for antineoplastic radiation therapy: Secondary | ICD-10-CM | POA: Diagnosis not present

## 2021-05-11 ENCOUNTER — Inpatient Hospital Stay: Payer: Medicare Other | Attending: Oncology

## 2021-05-11 ENCOUNTER — Other Ambulatory Visit: Payer: Self-pay

## 2021-05-11 ENCOUNTER — Ambulatory Visit
Admission: RE | Admit: 2021-05-11 | Discharge: 2021-05-11 | Disposition: A | Discharge status: Home / Self Care | Payer: Medicare Other | Source: Ambulatory Visit | Attending: Radiation Oncology | Admitting: Radiation Oncology

## 2021-05-11 ENCOUNTER — Inpatient Hospital Stay: Payer: Medicare Other

## 2021-05-11 DIAGNOSIS — C155 Malignant neoplasm of lower third of esophagus: Secondary | ICD-10-CM | POA: Insufficient documentation

## 2021-05-11 DIAGNOSIS — D6959 Other secondary thrombocytopenia: Secondary | ICD-10-CM | POA: Insufficient documentation

## 2021-05-11 DIAGNOSIS — Z79899 Other long term (current) drug therapy: Secondary | ICD-10-CM | POA: Insufficient documentation

## 2021-05-11 DIAGNOSIS — F17210 Nicotine dependence, cigarettes, uncomplicated: Secondary | ICD-10-CM | POA: Insufficient documentation

## 2021-05-11 DIAGNOSIS — Z510 Encounter for antineoplastic radiation therapy: Secondary | ICD-10-CM | POA: Diagnosis not present

## 2021-05-11 LAB — COMPREHENSIVE METABOLIC PANEL
ALT: 22 U/L (ref 0–44)
AST: 22 U/L (ref 15–41)
Albumin: 3.6 g/dL (ref 3.5–5.0)
Alkaline Phosphatase: 80 U/L (ref 38–126)
Anion gap: 8 (ref 5–15)
BUN: 40 mg/dL — ABNORMAL HIGH (ref 8–23)
CO2: 25 mmol/L (ref 22–32)
Calcium: 9.4 mg/dL (ref 8.9–10.3)
Chloride: 99 mmol/L (ref 98–111)
Creatinine, Ser: 0.97 mg/dL (ref 0.61–1.24)
GFR, Estimated: >60 mL/min (ref >60)
Glucose, Bld: 215 mg/dL — ABNORMAL HIGH (ref 70–99)
Potassium: 4.2 mmol/L (ref 3.5–5.1)
Sodium: 132 mmol/L — ABNORMAL LOW (ref 135–145)
Total Bilirubin: 0.9 mg/dL (ref 0.3–1.2)
Total Protein: 6.4 g/dL — ABNORMAL LOW (ref 6.5–8.1)

## 2021-05-11 LAB — CBC WITH DIFFERENTIAL/PLATELET
Abs Immature Granulocytes: 0.02 10*3/uL (ref 0.00–0.07)
Basophils Absolute: 0 10*3/uL (ref 0.0–0.1)
Basophils Relative: 1 %
Eosinophils Absolute: 0 10*3/uL (ref 0.0–0.5)
Eosinophils Relative: 1 %
HCT: 25.4 % — ABNORMAL LOW (ref 39.0–52.0)
Hemoglobin: 9.2 g/dL — ABNORMAL LOW (ref 13.0–17.0)
Immature Granulocytes: 1 %
Lymphocytes Relative: 13 %
Lymphs Abs: 0.4 10*3/uL — ABNORMAL LOW (ref 0.7–4.0)
MCH: 35.1 pg — ABNORMAL HIGH (ref 26.0–34.0)
MCHC: 36.2 g/dL — ABNORMAL HIGH (ref 30.0–36.0)
MCV: 96.9 fL (ref 80.0–100.0)
Monocytes Absolute: 0.4 10*3/uL (ref 0.1–1.0)
Monocytes Relative: 13 %
Neutro Abs: 2.1 10*3/uL (ref 1.7–7.7)
Neutrophils Relative %: 71 %
Platelets: 67 10*3/uL — ABNORMAL LOW (ref 150–400)
RBC: 2.62 MIL/uL — ABNORMAL LOW (ref 4.22–5.81)
RDW: 15.6 % — ABNORMAL HIGH (ref 11.5–15.5)
WBC: 2.9 10*3/uL — ABNORMAL LOW (ref 4.0–10.5)
nRBC: 0 % (ref 0.0–0.2)

## 2021-05-11 MED ORDER — SODIUM CHLORIDE 0.9% FLUSH
10.0000 mL | INTRAVENOUS | Status: DC | PRN
  Administered 2021-05-11: 10 mL via INTRAVENOUS
  Filled 2021-05-11: qty 10

## 2021-05-11 MED ORDER — HEPARIN SOD (PORK) LOCK FLUSH 100 UNIT/ML IV SOLN
500.0000 [IU] | Freq: Once | INTRAVENOUS | Status: AC
  Filled 2021-05-11: qty 5

## 2021-05-11 MED ORDER — HEPARIN SOD (PORK) LOCK FLUSH 100 UNIT/ML IV SOLN
INTRAVENOUS | Status: AC
  Administered 2021-05-11: 500 [IU] via INTRAVENOUS
  Filled 2021-05-11: qty 5

## 2021-05-11 NOTE — Progress Notes (Signed)
Per Dr. Janese Banks no treatment today, pt to keep appts as scheduled, okay to discharge home. Pt to call with any questions or concerns, pt aware and verbalizes understanding.  Pt stable at discharge.

## 2021-05-11 NOTE — Progress Notes (Signed)
Nutrition Follow-up:  Patient with esophageal cancer.  Patient with PEG tube placed on 10/29.  Patient chemotherapy held today due to low platelets, last radiation planned for 1/11.    Met with patient today in infusion.  Patient taking mostly 7 cartons of glucerna 1.5 daily via tube.  Having some nausea but controlled with medication. Bowel movement every other day (formed and loose stool).  Says that he is sipping on liquids, not significant amount orally.    Met with surgeon at [facility] yesterday and discussed surgery along with J-tube. Planning CT and PET scan before final decision made on surgery.    Medications: reviewed  Labs: Na 132, glucose 215, BUN 40, creatinine 0.97  Anthropometrics:   Weight  199 lb 5 oz on 1/3 in radiation  201 lb 12/28 199 lb on 12/14 199 lb on 12/7 193 lb on 11/17  225 lb on 11/2020   Estimated Energy Needs  Kcals: 5631-4970 Protein: 108-135 g Fluid: 2700-3122m  NUTRITION DIAGNOSIS: Inadequate oral intake but relying on feeding tube   INTERVENTION:  Continue glucerna 1.5, 7 cartons daily via PEG tube.  Flush same with pedialyte and water.   Patient says he received supplies on 1/3 but no formula.  RD called and left message for APendletonrepresentative regarding when patient will receive formula. Patient with questions regarding J-tube. Answered to patient's satisfaction. Patient has contact information    MONITORING, EVALUATION, GOAL: weight trends, formula   NEXT VISIT: Jan 20th after MD visit (Friday)  Elmor Kost B. AZenia Resides RTunica LDermaRegistered Dietitian [phone](mobile)

## 2021-05-12 ENCOUNTER — Telehealth: Payer: Self-pay

## 2021-05-12 ENCOUNTER — Ambulatory Visit
Admission: RE | Admit: 2021-05-12 | Discharge: 2021-05-12 | Disposition: A | Discharge status: Home / Self Care | Payer: Medicare Other | Source: Ambulatory Visit | Attending: Radiation Oncology | Admitting: Radiation Oncology

## 2021-05-12 DIAGNOSIS — Z510 Encounter for antineoplastic radiation therapy: Secondary | ICD-10-CM | POA: Diagnosis not present

## 2021-05-12 DIAGNOSIS — C155 Malignant neoplasm of lower third of esophagus: Secondary | ICD-10-CM | POA: Diagnosis not present

## 2021-05-12 NOTE — Telephone Encounter (Signed)
Nutrition  Received message from [place] saying that tube feeding formula shipment was delivered 1/5 at 6pm.    RD called patient to make sure he received shipment of formula and he confirmed that he received it.  Appreciative of phone call.  Alexander Tran B. Zenia Resides, [place], [place] Registered Dietitian [phone] (mobile)

## 2021-05-15 ENCOUNTER — Ambulatory Visit
Admission: RE | Admit: 2021-05-15 | Discharge: 2021-05-15 | Disposition: A | Discharge status: Home / Self Care | Payer: Medicare Other | Source: Ambulatory Visit | Attending: Radiation Oncology | Admitting: Radiation Oncology

## 2021-05-15 DIAGNOSIS — Z510 Encounter for antineoplastic radiation therapy: Secondary | ICD-10-CM | POA: Diagnosis not present

## 2021-05-15 DIAGNOSIS — C155 Malignant neoplasm of lower third of esophagus: Secondary | ICD-10-CM | POA: Diagnosis not present

## 2021-05-16 ENCOUNTER — Ambulatory Visit
Admission: RE | Admit: 2021-05-16 | Discharge: 2021-05-16 | Disposition: A | Discharge status: Home / Self Care | Payer: Medicare Other | Source: Ambulatory Visit | Attending: Radiation Oncology | Admitting: Radiation Oncology

## 2021-05-16 DIAGNOSIS — C155 Malignant neoplasm of lower third of esophagus: Secondary | ICD-10-CM | POA: Diagnosis not present

## 2021-05-16 DIAGNOSIS — Z510 Encounter for antineoplastic radiation therapy: Secondary | ICD-10-CM | POA: Diagnosis not present

## 2021-05-17 ENCOUNTER — Ambulatory Visit
Admission: RE | Admit: 2021-05-17 | Discharge: 2021-05-17 | Disposition: A | Discharge status: Home / Self Care | Payer: Medicare Other | Source: Ambulatory Visit | Attending: Radiation Oncology | Admitting: Radiation Oncology

## 2021-05-17 DIAGNOSIS — C155 Malignant neoplasm of lower third of esophagus: Secondary | ICD-10-CM | POA: Diagnosis not present

## 2021-05-17 DIAGNOSIS — Z510 Encounter for antineoplastic radiation therapy: Secondary | ICD-10-CM | POA: Diagnosis not present

## 2021-05-26 ENCOUNTER — Inpatient Hospital Stay: Payer: Medicare Other

## 2021-05-26 ENCOUNTER — Inpatient Hospital Stay (HOSPITAL_BASED_OUTPATIENT_CLINIC_OR_DEPARTMENT_OTHER): Payer: Medicare Other | Admitting: Oncology

## 2021-05-26 ENCOUNTER — Other Ambulatory Visit: Payer: Self-pay

## 2021-05-26 ENCOUNTER — Encounter: Payer: Self-pay | Admitting: Oncology

## 2021-05-26 VITALS — Temp 96.6°F | Resp 16 | Ht 77.0 in | Wt 200.6 lb

## 2021-05-26 DIAGNOSIS — Z79899 Other long term (current) drug therapy: Secondary | ICD-10-CM | POA: Insufficient documentation

## 2021-05-26 DIAGNOSIS — C155 Malignant neoplasm of lower third of esophagus: Secondary | ICD-10-CM

## 2021-05-26 DIAGNOSIS — F17210 Nicotine dependence, cigarettes, uncomplicated: Secondary | ICD-10-CM | POA: Insufficient documentation

## 2021-05-26 DIAGNOSIS — Z7189 Other specified counseling: Secondary | ICD-10-CM | POA: Diagnosis not present

## 2021-05-26 DIAGNOSIS — D6959 Other secondary thrombocytopenia: Secondary | ICD-10-CM | POA: Insufficient documentation

## 2021-05-26 LAB — COMPREHENSIVE METABOLIC PANEL
ALT: 21 U/L (ref 0–44)
AST: 21 U/L (ref 15–41)
Albumin: 4 g/dL (ref 3.5–5.0)
Alkaline Phosphatase: 99 U/L (ref 38–126)
Anion gap: 11 (ref 5–15)
BUN: 38 mg/dL — ABNORMAL HIGH (ref 8–23)
CO2: 24 mmol/L (ref 22–32)
Calcium: 9.8 mg/dL (ref 8.9–10.3)
Chloride: 99 mmol/L (ref 98–111)
Creatinine, Ser: 1.09 mg/dL (ref 0.61–1.24)
GFR, Estimated: >60 mL/min (ref >60)
Glucose, Bld: 254 mg/dL — ABNORMAL HIGH (ref 70–99)
Potassium: 4.7 mmol/L (ref 3.5–5.1)
Sodium: 134 mmol/L — ABNORMAL LOW (ref 135–145)
Total Bilirubin: 0.5 mg/dL (ref 0.3–1.2)
Total Protein: 6.9 g/dL (ref 6.5–8.1)

## 2021-05-26 LAB — CBC WITH DIFFERENTIAL/PLATELET
Abs Immature Granulocytes: 0.02 10*3/uL (ref 0.00–0.07)
Basophils Absolute: 0 10*3/uL (ref 0.0–0.1)
Basophils Relative: 0 %
Eosinophils Absolute: 0.1 10*3/uL (ref 0.0–0.5)
Eosinophils Relative: 4 %
HCT: 27.9 % — ABNORMAL LOW (ref 39.0–52.0)
Hemoglobin: 9.8 g/dL — ABNORMAL LOW (ref 13.0–17.0)
Immature Granulocytes: 1 %
Lymphocytes Relative: 16 %
Lymphs Abs: 0.5 10*3/uL — ABNORMAL LOW (ref 0.7–4.0)
MCH: 35.6 pg — ABNORMAL HIGH (ref 26.0–34.0)
MCHC: 35.1 g/dL (ref 30.0–36.0)
MCV: 101.5 fL — ABNORMAL HIGH (ref 80.0–100.0)
Monocytes Absolute: 0.6 10*3/uL (ref 0.1–1.0)
Monocytes Relative: 18 %
Neutro Abs: 1.9 10*3/uL (ref 1.7–7.7)
Neutrophils Relative %: 61 %
Platelets: 194 10*3/uL (ref 150–400)
RBC: 2.75 MIL/uL — ABNORMAL LOW (ref 4.22–5.81)
RDW: 18.8 % — ABNORMAL HIGH (ref 11.5–15.5)
WBC: 3.1 10*3/uL — ABNORMAL LOW (ref 4.0–10.5)
nRBC: 0 % (ref 0.0–0.2)

## 2021-05-26 NOTE — Progress Notes (Signed)
Patient refused to be re-stuck for CEA lab today will order in 3 weeks, next lab draw

## 2021-05-26 NOTE — Progress Notes (Signed)
Nutrition Follow-up:  Patient with esophageal cancer.  PEG placed on 10/29. Patient has completed chemo and radiation.   Met with patient and son, Joey following MD appointment.  Patient not feeling well this afternoon. Says that he is nauseated.  Continues to tolerate glucerna 1.5, 7 cartons daily. Continues with pedialyte and water flush.  Son says that typically does not have nausea.  Says that he has 1 episode of diarrhea a week otherwise normal stool q other day.    Has been eating some solid foods, chicken pot pie, mashed potatoes, soups, 1/2 hamburger  Medications: reviewed  Labs: Na 134, glucose 254, BUN 38, creatinine 1.09  Anthropometrics:   Weight 200 lb 9.6 oz, stable   Estimated Energy Needs  Kcals: 2250-2700 Protein: 108-135 g Fluid: 2700-3150  NUTRITION DIAGNOSIS: Inadequate oral intake continues utilizing PEG   INTERVENTION:  Continue glucerna 1.5, 7 cartons daily, same flush of pedilyte and water.   Continue soft solids orally as tolerated.    MONITORING, EVALUATION, GOAL: weight trends, intake, tube feeding    NEXT VISIT: Thursday, Feb 16th phone call  Dawnyel Leven B. Zenia Resides, [place], [place] Registered Dietitian [phone] (mobile)

## 2021-05-28 ENCOUNTER — Encounter: Payer: Self-pay | Admitting: Oncology

## 2021-05-28 NOTE — Progress Notes (Signed)
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  CT abdomen and pelvis without contrast in the ER showed mildly dilated distal esophagus with fluid content.  Underlying mass or stricture cannot be excluded.  There was no locoregional adenopathy or distant metastatic disease noted on CT abdomen.  Patient was seen by Dr. Verl Blalock and underwent EGD today.  Large fungating mass with no bleeding or stigmata of recent bleeding noted at the GE junction 40 cm from the incisors.  Mass was completely obstructing and circumferential.     Biopsy showed moderately differentiated adenocarcinoma.Head CT scan showed distal esophageal activity with an SUV of 4.1 along the proximal stomach and GE junction.  0.6 cm distal paraesophageal lymph node with an SUV of 1.8.  Enlarged right gastric lymph node close proximity to the tumor 1.2 cm with an SUV of 2.5.   There was a large pneumoperitoneum in the upper abdomen which was also evaluated by general surgery Dr. Peyton Najjar who had put in the PEG tube and subsequent study did not show any evidence of contrast leakage into the peritoneal cavity.   Patient completed concurrent chemoradiation with weekly CarboTaxol in early January 2023.  However he could only get 5  cycles ofCarboTaxol interval of 6 given thrombocytopenia.  He was evaluated by [facility] surgery and also has an appointment coming up with them in February 2023 to see what his scans show and if he would be a candidate for surgery  Interval history-patient states that he has been able to eat soft foods such as ice cream or chicken pot pie which can sometimes go down without any difficulty.  He is still relying on PEG tube feeds for his nutrition.  Still reports significant fatigue.  He spends most of his time resting or in bed.  Denies any significant pain.  Continues to smoke  ECOG PS- 2 Pain scale- 0   Review of systems- Review of Systems  Constitutional:  Positive for malaise/fatigue. Negative for chills, fever and weight loss.  HENT:  Negative for congestion, ear discharge and nosebleeds.   Eyes:  Negative for blurred vision.  Respiratory:  Negative for cough, hemoptysis, sputum production, shortness of breath and wheezing.   Cardiovascular:  Negative for chest pain, palpitations, orthopnea and claudication.  Gastrointestinal:  Negative for abdominal pain, blood in stool, constipation, diarrhea, heartburn, melena, nausea and vomiting.  Genitourinary:  Negative for dysuria, flank pain, frequency, hematuria and urgency.  Musculoskeletal:  Negative for back pain, joint pain and myalgias.  Skin:  Negative for rash.  Neurological:  Negative for dizziness, tingling, focal weakness, seizures, weakness and headaches.  Endo/Heme/Allergies:  Does not bruise/bleed easily.  Psychiatric/Behavioral:  Negative for depression and suicidal ideas. The patient does not have insomnia.      Allergies  Allergen Reactions  Iodine    Other Other (See Comments)    Uncoded Allergy. 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[facility])      Past Surgical History:  Procedure Laterality Date   BACK SURGERY     CARDIAC CATHETERIZATION Left 11/02/2015   Procedure: Left Heart Cath and Coronary Angiography;  Surgeon: Isaias Cowman, MD;  Location: [place] [facility];  Service: Cardiovascular;  Laterality: Left;   ESOPHAGOGASTRODUODENOSCOPY (EGD) WITH PROPOFOL N/A 03/03/2021   Procedure: ESOPHAGOGASTRODUODENOSCOPY (EGD) WITH PROPOFOL;  Surgeon: [name], MD;  Location: [facility] ENDOS[facility];  Service: Endoscopy;  Laterality: N/A;   GASTROSTOMY N/A 03/04/2021   Procedure: INSERTION OF GASTROSTOMY TUBE;  Surgeon: Herbert Pun, MD;  Location: [facility] ORS;  Service: General;  Laterality: N/A;   IR CM INJ ANY COLONIC TUBE W/FLUORO  03/17/2021   PORTACATH PLACEMENT Right 03/06/2021   Procedure: INSERTION PORT-A-CATH;  Surgeon: Herbert Pun, MD;  Location: [facility] ORS;  Service: General;  Laterality: Right;   SURGERY SCROTAL / TESTICULAR      Social History   Socioeconomic History   Marital status: Married    Spouse name: Not on file   Number of children: Not on file   Years of education: Not on file   Highest education level: Not on file  Occupational History   Occupation: retired Estate manager/land agent   Occupation: works part time now  Tobacco Use   Smoking status: Every Day    Packs/day: 1.00    Types: Cigarettes   Smokeless tobacco: Never  Vaping Use   Vaping Use: Never used  Substance and Sexual Activity   Alcohol use: Not Currently   Drug use: No   Sexual activity: Not Currently  Other Topics Concern   Not on file  Social History Narrative   Not on file   Social Determinants of Health   Financial Resource Strain: Not on file  Food Insecurity: Not on file  Transportation Needs: Not on file  Physical Activity: Not on file  Stress: Not on file  Social Connections: Not on file  Intimate Partner Violence: Not on  file    Family History  Problem Relation Age of Onset   Coronary artery disease Father      Current Outpatient Medications:    aspirin 81 MG EC tablet, Take 1 tablet (81 mg total) by mouth daily., Disp: 30 tablet, Rfl: 3   atorvastatin (LIPITOR) 40 MG tablet, Take 1 tablet (40 mg total) by mouth daily at 6 PM., Disp: 30 tablet, Rfl: 3   lidocaine-prilocaine (EMLA) cream, Apply to affected area once, Disp: 30 g, Rfl: 3   lisinopril (ZESTRIL) 2.5 MG tablet, Take 2.5 mg by mouth daily., Disp: , Rfl:    LORazepam (ATIVAN) 0.5 MG tablet, Take 1 tablet (0.5 mg total) by mouth every 6 (six) hours as needed (Nausea or vomiting)., Disp: 30 tablet, Rfl: 0   metFORMIN (GLUCOPHAGE) 1000 MG tablet, Take 1 tablet (1,000 mg total) by mouth 2 (two) times daily with a meal. (Patient taking differently: Take 1,000 mg by mouth daily.), Disp: 60 tablet, Rfl: 3   Nutritional Supplements (FEEDING SUPPLEMENT, GLUCERNA 1.5 CAL,) LIQD, Give 2 cartons at 8am, 2 cartons at noon, 2 cartons at 6pm and  1 carton at 10pm (total 7 cartons/day).  Flush with 270ml at first 3 feedings and 238ml at last feeding of the day., Disp: 1659 mL, Rfl: 2   ondansetron (ZOFRAN) 8 MG tablet, Take 1 tablet (8 mg total) by mouth 2 (two) times daily as needed for refractory nausea / vomiting. Start on day 3 after chemo., Disp: 30 tablet, Rfl: 1   prochlorperazine (COMPAZINE) 10 MG tablet, Take 1 tablet (10 mg total) by mouth every 6 (six) hours as needed (Nausea or vomiting)., Disp: 30 tablet, Rfl: 1   metoprolol succinate (TOPROL-XL) 25 MG 24 hr tablet, Take 25 mg by mouth daily. (Patient not taking: Reported on 03/17/2021), Disp: , Rfl:    metoprolol tartrate (LOPRESSOR) 25 MG tablet, Take 1 tablet (25 mg total) by mouth 2 (two) times daily. (Patient not taking: Reported on 03/17/2021), Disp: 60 tablet, Rfl: 3  Physical exam:  Vitals:   05/26/21 1445  Resp: 16  Temp: (!) 96.6 F (35.9 C)  TempSrc: Tympanic  Weight: 200 lb 9.6 oz (91  kg)  Height: 6\' 5"  (1.956 m)   Physical Exam Constitutional:      Comments: Sitting ina wheelchair. Appears fatigued  Cardiovascular:     Rate and Rhythm: Normal rate and regular rhythm.     Heart sounds: Normal heart sounds.  Pulmonary:     Effort: Pulmonary effort is normal.     Breath sounds: Normal breath sounds.  Abdominal:     General: Bowel sounds are normal.     Palpations: Abdomen is soft.     Comments: Peg tube in place  Skin:    General: Skin is warm and dry.  Neurological:     Mental Status: He is alert and oriented to person, place, and time.     CMP Latest Ref Rng & Units 05/26/2021  Glucose 70 - 99 mg/dL 254(H)  BUN 8 - 23 mg/dL 38(H)  Creatinine 0.61 - 1.24 mg/dL 1.09  Sodium 135 - 145 mmol/L 134(L)  Potassium 3.5 - 5.1 mmol/L 4.7  Chloride 98 - 111 mmol/L 99  CO2 22 - 32 mmol/L 24  Calcium 8.9 - 10.3 mg/dL 9.8  Total Protein 6.5 - 8.1 g/dL 6.9  Total Bilirubin 0.3 - 1.2 mg/dL 0.5  Alkaline Phos 38 - 126 U/L 99  AST 15 - 41 U/L 21  ALT 0 - 44 U/L 21   CBC Latest Ref Rng & Units 05/26/2021  WBC 4.0 - 10.5 K/uL 3.1(L)  Hemoglobin 13.0 - 17.0 g/dL 9.8(L)  Hematocrit 39.0 - 52.0 % 27.9(L)  Platelets 150 - 400 K/uL 194    Assessment and plan- Patient is a 71 y.o. male  with locally advanced GE junction adenocarcinoma T2/T3 N1 M0 stage III.  He is s/p concurrent CarboTaxol chemotherapy with 5 weekly cycles of CarboTaxol.  He is here for routine follow-up.   Patient could not get his last cycle of CarboTaxol chemotherapy due to thrombocytopenia.  His platelet counts were down to 67 about 2 weeks ago and he recovered to 194.  White count and hemoglobin are also better.  He has a repeat PET scan scheduled at [facility] in 2 weeks time and he will be assessed by surgery at that time to see if he is a surgical candidate.  My concern presently is that he still reports ongoing fatigue and spends most of his time in bed.  He has not exercised or improved his ambulation at all  over the last 5 weeks.  Also  he continues to smoke.  These factors would likely not qualify him for surgery.  I will plan to see him in about 3 weeks time after his visit at [facility] with labs CBC with differential and CMP as well as CEA.  I have again encouraged his patient as well as educated his son to have the patient exercise regularly in order to have chances to qualify for surgery   Visit Diagnosis 1. Malignant neoplasm of lower third of esophagus (HCC)   2. Goals of care, counseling/discussion      [name], MD, MPH [facility] at [facility] [phone] 05/28/2021 9:02 PM

## 2021-05-30 ENCOUNTER — Telehealth: Payer: Self-pay | Admitting: *Deleted

## 2021-06-05 ENCOUNTER — Encounter: Payer: Self-pay | Admitting: Oncology

## 2021-06-05 NOTE — Telephone Encounter (Signed)
error 

## 2021-06-07 DIAGNOSIS — E119 Type 2 diabetes mellitus without complications: Secondary | ICD-10-CM | POA: Diagnosis not present

## 2021-06-07 DIAGNOSIS — D6481 Anemia due to antineoplastic chemotherapy: Secondary | ICD-10-CM | POA: Insufficient documentation

## 2021-06-07 DIAGNOSIS — T451X5A Adverse effect of antineoplastic and immunosuppressive drugs, initial encounter: Secondary | ICD-10-CM | POA: Insufficient documentation

## 2021-06-07 DIAGNOSIS — M5136 Other intervertebral disc degeneration, lumbar region: Secondary | ICD-10-CM | POA: Diagnosis not present

## 2021-06-07 DIAGNOSIS — F17210 Nicotine dependence, cigarettes, uncomplicated: Secondary | ICD-10-CM | POA: Diagnosis not present

## 2021-06-07 DIAGNOSIS — Z720 Tobacco use: Secondary | ICD-10-CM | POA: Diagnosis not present

## 2021-06-07 DIAGNOSIS — R0602 Shortness of breath: Secondary | ICD-10-CM | POA: Diagnosis not present

## 2021-06-07 DIAGNOSIS — I452 Bifascicular block: Secondary | ICD-10-CM | POA: Diagnosis not present

## 2021-06-07 DIAGNOSIS — C159 Malignant neoplasm of esophagus, unspecified: Secondary | ICD-10-CM | POA: Diagnosis not present

## 2021-06-07 DIAGNOSIS — Z01818 Encounter for other preprocedural examination: Secondary | ICD-10-CM | POA: Diagnosis not present

## 2021-06-07 DIAGNOSIS — I119 Hypertensive heart disease without heart failure: Secondary | ICD-10-CM | POA: Diagnosis not present

## 2021-06-07 DIAGNOSIS — C158 Malignant neoplasm of overlapping sites of esophagus: Secondary | ICD-10-CM | POA: Diagnosis not present

## 2021-06-07 DIAGNOSIS — R9431 Abnormal electrocardiogram [ECG] [EKG]: Secondary | ICD-10-CM | POA: Diagnosis not present

## 2021-06-07 DIAGNOSIS — Z79899 Other long term (current) drug therapy: Secondary | ICD-10-CM | POA: Diagnosis not present

## 2021-06-07 DIAGNOSIS — I10 Essential (primary) hypertension: Secondary | ICD-10-CM | POA: Diagnosis not present

## 2021-06-07 DIAGNOSIS — I2510 Atherosclerotic heart disease of native coronary artery without angina pectoris: Secondary | ICD-10-CM | POA: Diagnosis not present

## 2021-06-15 ENCOUNTER — Other Ambulatory Visit: Payer: Medicare Other

## 2021-06-15 DIAGNOSIS — Z79899 Other long term (current) drug therapy: Secondary | ICD-10-CM | POA: Diagnosis not present

## 2021-06-15 DIAGNOSIS — E785 Hyperlipidemia, unspecified: Secondary | ICD-10-CM | POA: Diagnosis not present

## 2021-06-15 DIAGNOSIS — E119 Type 2 diabetes mellitus without complications: Secondary | ICD-10-CM | POA: Diagnosis not present

## 2021-06-15 DIAGNOSIS — Z9221 Personal history of antineoplastic chemotherapy: Secondary | ICD-10-CM | POA: Diagnosis not present

## 2021-06-15 DIAGNOSIS — M1711 Unilateral primary osteoarthritis, right knee: Secondary | ICD-10-CM | POA: Diagnosis not present

## 2021-06-15 DIAGNOSIS — I10 Essential (primary) hypertension: Secondary | ICD-10-CM | POA: Diagnosis not present

## 2021-06-15 DIAGNOSIS — E039 Hypothyroidism, unspecified: Secondary | ICD-10-CM | POA: Diagnosis not present

## 2021-06-15 DIAGNOSIS — Z7982 Long term (current) use of aspirin: Secondary | ICD-10-CM | POA: Diagnosis not present

## 2021-06-15 DIAGNOSIS — K2270 Barrett's esophagus without dysplasia: Secondary | ICD-10-CM | POA: Diagnosis not present

## 2021-06-15 DIAGNOSIS — Z923 Personal history of irradiation: Secondary | ICD-10-CM | POA: Diagnosis not present

## 2021-06-15 DIAGNOSIS — C159 Malignant neoplasm of esophagus, unspecified: Secondary | ICD-10-CM | POA: Diagnosis not present

## 2021-06-15 DIAGNOSIS — F17210 Nicotine dependence, cigarettes, uncomplicated: Secondary | ICD-10-CM | POA: Diagnosis not present

## 2021-06-15 DIAGNOSIS — I2510 Atherosclerotic heart disease of native coronary artery without angina pectoris: Secondary | ICD-10-CM | POA: Diagnosis not present

## 2021-06-15 DIAGNOSIS — Z7984 Long term (current) use of oral hypoglycemic drugs: Secondary | ICD-10-CM | POA: Diagnosis not present

## 2021-06-15 DIAGNOSIS — D6481 Anemia due to antineoplastic chemotherapy: Secondary | ICD-10-CM | POA: Diagnosis not present

## 2021-06-15 DIAGNOSIS — I252 Old myocardial infarction: Secondary | ICD-10-CM | POA: Diagnosis not present

## 2021-06-15 DIAGNOSIS — Z8501 Personal history of malignant neoplasm of esophagus: Secondary | ICD-10-CM | POA: Diagnosis not present

## 2021-06-19 ENCOUNTER — Inpatient Hospital Stay (HOSPITAL_BASED_OUTPATIENT_CLINIC_OR_DEPARTMENT_OTHER): Payer: Medicare Other | Admitting: Oncology

## 2021-06-19 ENCOUNTER — Encounter: Payer: Self-pay | Admitting: Oncology

## 2021-06-19 ENCOUNTER — Ambulatory Visit: Payer: Medicare Other | Admitting: Radiation Oncology

## 2021-06-19 ENCOUNTER — Other Ambulatory Visit: Payer: Self-pay

## 2021-06-19 ENCOUNTER — Inpatient Hospital Stay: Payer: Medicare Other | Attending: Oncology

## 2021-06-19 VITALS — BP 118/75 | HR 86 | Temp 95.5°F | Resp 16 | Ht 77.0 in | Wt 196.0 lb

## 2021-06-19 DIAGNOSIS — C160 Malignant neoplasm of cardia: Secondary | ICD-10-CM | POA: Insufficient documentation

## 2021-06-19 DIAGNOSIS — Z9221 Personal history of antineoplastic chemotherapy: Secondary | ICD-10-CM | POA: Diagnosis not present

## 2021-06-19 DIAGNOSIS — Z7189 Other specified counseling: Secondary | ICD-10-CM | POA: Diagnosis not present

## 2021-06-19 DIAGNOSIS — Z923 Personal history of irradiation: Secondary | ICD-10-CM | POA: Diagnosis not present

## 2021-06-19 DIAGNOSIS — C772 Secondary and unspecified malignant neoplasm of intra-abdominal lymph nodes: Secondary | ICD-10-CM | POA: Diagnosis not present

## 2021-06-19 DIAGNOSIS — C155 Malignant neoplasm of lower third of esophagus: Secondary | ICD-10-CM | POA: Diagnosis not present

## 2021-06-19 LAB — COMPREHENSIVE METABOLIC PANEL
ALT: 22 U/L (ref 0–44)
AST: 18 U/L (ref 15–41)
Albumin: 3.9 g/dL (ref 3.5–5.0)
Alkaline Phosphatase: 87 U/L (ref 38–126)
Anion gap: 9 (ref 5–15)
BUN: 31 mg/dL — ABNORMAL HIGH (ref 8–23)
CO2: 24 mmol/L (ref 22–32)
Calcium: 9.2 mg/dL (ref 8.9–10.3)
Chloride: 103 mmol/L (ref 98–111)
Creatinine, Ser: 1.07 mg/dL (ref 0.61–1.24)
GFR, Estimated: >60 mL/min (ref >60)
Glucose, Bld: 150 mg/dL — ABNORMAL HIGH (ref 70–99)
Potassium: 4.1 mmol/L (ref 3.5–5.1)
Sodium: 136 mmol/L (ref 135–145)
Total Bilirubin: 0.5 mg/dL (ref 0.3–1.2)
Total Protein: 7 g/dL (ref 6.5–8.1)

## 2021-06-19 LAB — CBC WITH DIFFERENTIAL/PLATELET
Abs Immature Granulocytes: 0.04 10*3/uL (ref 0.00–0.07)
Basophils Absolute: 0 10*3/uL (ref 0.0–0.1)
Basophils Relative: 1 %
Eosinophils Absolute: 0.2 10*3/uL (ref 0.0–0.5)
Eosinophils Relative: 4 %
HCT: 32.4 % — ABNORMAL LOW (ref 39.0–52.0)
Hemoglobin: 11.3 g/dL — ABNORMAL LOW (ref 13.0–17.0)
Immature Granulocytes: 1 %
Lymphocytes Relative: 8 %
Lymphs Abs: 0.5 10*3/uL — ABNORMAL LOW (ref 0.7–4.0)
MCH: 36.9 pg — ABNORMAL HIGH (ref 26.0–34.0)
MCHC: 34.9 g/dL (ref 30.0–36.0)
MCV: 105.9 fL — ABNORMAL HIGH (ref 80.0–100.0)
Monocytes Absolute: 0.7 10*3/uL (ref 0.1–1.0)
Monocytes Relative: 11 %
Neutro Abs: 4.6 10*3/uL (ref 1.7–7.7)
Neutrophils Relative %: 75 %
Platelets: 244 10*3/uL (ref 150–400)
RBC: 3.06 MIL/uL — ABNORMAL LOW (ref 4.22–5.81)
RDW: 17.2 % — ABNORMAL HIGH (ref 11.5–15.5)
WBC: 6 10*3/uL (ref 4.0–10.5)
nRBC: 0 % (ref 0.0–0.2)

## 2021-06-19 NOTE — Progress Notes (Addendum)
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  CT abdomen and pelvis without contrast in the ER showed mildly dilated distal esophagus with fluid content.  Underlying mass or stricture cannot be excluded.  There was no locoregional adenopathy or distant metastatic disease noted on CT abdomen.  Patient was seen by Dr. Verl Blalock and underwent EGD today.  Large fungating mass with no bleeding or stigmata of recent bleeding noted at the GE junction 40 cm from the incisors.  Mass was completely obstructing and circumferential.     Biopsy showed moderately differentiated adenocarcinoma.Head CT scan showed distal esophageal activity with an SUV of 4.1 along the proximal stomach and GE junction.  0.6 cm distal paraesophageal lymph node with an SUV of 1.8.  Enlarged right gastric lymph node close proximity to the tumor 1.2 cm with an SUV of 2.5.   There was a large pneumoperitoneum in the upper abdomen which was also evaluated by general surgery Dr. Peyton Najjar who had put in the PEG tube and subsequent study did not show any evidence of contrast leakage into the peritoneal cavity.   Patient completed concurrent chemoradiation with weekly CarboTaxol in early January 2023.  However he  could only get 5 cycles ofCarboTaxol interval of 6 given thrombocytopenia.  He was evaluated by [facility] surgery and also has an appointment coming up with them in February 2023 to see what his scans show and if he would be a candidate for surgery  Patient was seen by cardiothoracic surgery Dr. Leslee Home IDU.  [facility] which showed complete resolution of hypermetabolism in the area of esophagus as well as no evidence of distant metastatic disease.  This was followed by an endoscopy which did not show any residual mass in this GE junction.  Distal esophagus was normal and no evidence of cancer in the cardia.  Biopsies were negative for malignancy.  Plan is therefore to hold off on surgery at this time and continue surveillance EGD every 3 months with biopsies    Interval history-patient reports ongoing fatigue but has been doing well overall.  He reports improvement in his swallowing and would like to get his feeding tube out.  ECOG PS- 1 Pain scale- 0 Opioid associated constipation- no  Review of systems- Review of Systems  Constitutional:  Positive for malaise/fatigue. Negative for chills, fever and weight loss.  HENT:  Negative for congestion, ear discharge and nosebleeds.   Eyes:  Negative for blurred vision.  Respiratory:  Negative for cough, hemoptysis, sputum production, shortness of breath and wheezing.   Cardiovascular:  Negative for chest pain, palpitations, orthopnea and claudication.  Gastrointestinal:  Negative for abdominal pain, blood in stool, constipation, diarrhea, heartburn, melena, nausea and vomiting.  Genitourinary:  Negative for dysuria, flank pain,  frequency, hematuria and urgency.  Musculoskeletal:  Negative for back pain, joint pain and myalgias.  Skin:  Negative for rash.  Neurological:  Negative for dizziness, tingling, focal weakness, seizures, weakness and headaches.  Endo/Heme/Allergies:  Does not bruise/bleed easily.   Psychiatric/Behavioral:  Negative for depression and suicidal ideas. The patient does not have insomnia.       Allergies  Allergen Reactions   Iodine    Other Other (See Comments)    Uncoded Allergy. 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[facility])      Past Surgical History:  Procedure Laterality Date   BACK SURGERY     CARDIAC CATHETERIZATION Left 11/02/2015   Procedure: Left Heart Cath and Coronary Angiography;  Surgeon: Isaias Cowman, MD;  Location: [place] [facility];  Service: Cardiovascular;  Laterality: Left;   ESOPHAGOGASTRODUODENOSCOPY (EGD) WITH PROPOFOL N/A 03/03/2021   Procedure: ESOPHAGOGASTRODUODENOSCOPY (EGD) WITH PROPOFOL;  Surgeon: [name], MD;  Location: [facility] ENDOSCOPY;  Service: Endoscopy;  Laterality: N/A;   GASTROSTOMY N/A 03/04/2021   Procedure: INSERTION OF GASTROSTOMY TUBE;  Surgeon: Herbert Pun, MD;  Location: [facility] ORS;  Service: General;  Laterality: N/A;   IR CM INJ ANY COLONIC TUBE W/FLUORO  03/17/2021   PORTACATH PLACEMENT Right 03/06/2021   Procedure: INSERTION PORT-A-CATH;  Surgeon: Herbert Pun, MD;  Location: [facility] ORS;  Service: General;  Laterality: Right;   SURGERY SCROTAL / TESTICULAR      Social History   Socioeconomic History   Marital status: Married    Spouse name: Not on file   Number of children: Not on file   Years of education: Not on file   Highest education level: Not on file  Occupational History   Occupation: retired Estate manager/land agent   Occupation: works part time now  Tobacco Use   Smoking status: Every Day    Packs/day: 1.00    Types: Cigarettes   Smokeless tobacco: Never  Vaping Use   Vaping Use: Never used  Substance and Sexual Activity    Alcohol use: Not Currently   Drug use: No   Sexual activity: Not Currently  Other Topics Concern   Not on file  Social History Narrative   Not on file   Social Determinants of Health   Financial Resource Strain: Not on file  Food Insecurity: Not on file  Transportation Needs: Not on file  Physical Activity: Not on file  Stress: Not on file  Social Connections: Not on file  Intimate Partner Violence: Not on file    Family History  Problem Relation Age of Onset   Coronary artery disease Father      Current Outpatient Medications:    lidocaine-prilocaine (EMLA) cream, Apply to affected area once, Disp: 30 g, Rfl: 3   lisinopril (ZESTRIL) 2.5 MG tablet, Take 2.5 mg by mouth daily., Disp: , Rfl:    metFORMIN (GLUCOPHAGE) 1000 MG tablet, Take 1 tablet (1,000 mg total) by mouth 2 (two) times daily with a meal., Disp: 60 tablet, Rfl: 3   Nutritional Supplements (FEEDING SUPPLEMENT, GLUCERNA 1.5 CAL,) LIQD, Give 2 cartons at 8am, 2 cartons at noon, 2 cartons at 6pm and 1 carton at 10pm (total 7 cartons/day).  Flush with 254ml at first 3 feedings  and 255ml at last feeding of the day., Disp: 1659 mL, Rfl: 2   aspirin 81 MG EC tablet, Take 1 tablet (81 mg total) by mouth daily. (Patient not taking: Reported on 06/19/2021), Disp: 30 tablet, Rfl: 3   atorvastatin (LIPITOR) 40 MG tablet, Take 1 tablet (40 mg total) by mouth daily at 6 PM. (Patient not taking: Reported on 06/19/2021), Disp: 30 tablet, Rfl: 3   levothyroxine (SYNTHROID) 100 MCG tablet, Take 100 mcg by mouth daily., Disp: , Rfl:    LORazepam (ATIVAN) 0.5 MG tablet, Take 1 tablet (0.5 mg total) by mouth every 6 (six) hours as needed (Nausea or vomiting). (Patient not taking: Reported on 06/19/2021), Disp: 30 tablet, Rfl: 0   metoprolol succinate (TOPROL-XL) 25 MG 24 hr tablet, Take 25 mg by mouth daily. (Patient not taking: Reported on 03/17/2021), Disp: , Rfl:    metoprolol tartrate (LOPRESSOR) 25 MG tablet, Take 1 tablet (25 mg total)  by mouth 2 (two) times daily. (Patient not taking: Reported on 03/17/2021), Disp: 60 tablet, Rfl: 3   ondansetron (ZOFRAN) 8 MG tablet, Take 1 tablet (8 mg total) by mouth 2 (two) times daily as needed for refractory nausea / vomiting. Start on day 3 after chemo. (Patient not taking: Reported on 06/19/2021), Disp: 30 tablet, Rfl: 1   prochlorperazine (COMPAZINE) 10 MG tablet, Take 1 tablet (10 mg total) by mouth every 6 (six) hours as needed (Nausea or vomiting). (Patient not taking: Reported on 06/19/2021), Disp: 30 tablet, Rfl: 1  Physical exam:  Vitals:   06/19/21 1005  BP: 118/75  Pulse: 86  Resp: 16  Temp: (!) 95.5 F (35.3 C)  TempSrc: Tympanic  SpO2: 98%  Weight: 196 lb (88.9 kg)  Height: 6\' 5"  (1.956 m)   Physical Exam Constitutional:      General: He is not in acute distress. Cardiovascular:     Rate and Rhythm: Normal rate and regular rhythm.     Heart sounds: Normal heart sounds.  Pulmonary:     Effort: Pulmonary effort is normal.     Breath sounds: Normal breath sounds.  Abdominal:     General: Bowel sounds are normal.     Palpations: Abdomen is soft.     Comments: PEG tube in place  Skin:    General: Skin is warm and dry.  Neurological:     Mental Status: He is alert and oriented to person, place, and time.     CMP Latest Ref Rng & Units 06/19/2021  Glucose 70 - 99 mg/dL 150(H)  BUN 8 - 23 mg/dL 31(H)  Creatinine 0.61 - 1.24 mg/dL 1.07  Sodium 135 - 145 mmol/L 136  Potassium 3.5 - 5.1 mmol/L 4.1  Chloride 98 - 111 mmol/L 103  CO2 22 - 32 mmol/L 24  Calcium 8.9 - 10.3 mg/dL 9.2  Total Protein 6.5 - 8.1 g/dL 7.0  Total Bilirubin 0.3 - 1.2 mg/dL 0.5  Alkaline Phos 38 - 126 U/L 87  AST 15 - 41 U/L 18  ALT 0 - 44 U/L 22   CBC Latest Ref Rng & Units 06/19/2021  WBC 4.0 - 10.5 K/uL 6.0  Hemoglobin 13.0 - 17.0 g/dL 11.3(L)  Hematocrit 39.0 - 52.0 % 32.4(L)  Platelets 150 - 400 K/uL 244     Assessment and plan- Patient is a 71 y.o. male   with locally  advanced GE junction adenocarcinoma T2/T3 N1 M0 stage III.  He is s/p concurrent CarboTaxol chemotherapy with radiation treatment.  He is here  to discuss further management  Patient was found to have mild hypermetabolic node in the gastrohepaticLymph nodes on his initial PET scan which was the basis of categorizing him as an N1 disease.  He had a large fungating mass at the lower end of the esophagus causing near complete obstruction and requiring a PEG tube placement initially.  After concurrent chemoradiation patient underwent PET/CT scan at [facility] which shows complete resolution of hypermetabolic areas and no evidence of recurrent disease.  He also had a repeat EGD which did not show any residual mass and biopsies negative for malignancy.  He was evaluated by cardiothoracic surgery [name] but plan is to hold off any further surgery at this time especially given his history of ongoing smoking.  Plan is for surveillance EGDs every 3 months with biopsies.  I will plan to obtain outside PET scan images for my review as well  We will reach out to [name] team to specify the frequency of her EGD and patient would like to get them here instead of getting that at [facility] and I will reach out to [name] after I hear back from [name].  I will see him back in 3 months with CBC with differential CMP CEA and CT chest abdomen and pelvis with contrast prior.  I would like him to keep the port in place for now.  Patient is very eager to get his feeding tube out and if I hear back from [name] that his oral intake without feeding tube is meeting his nutritional needs we will plan to remove his PEG tube   Visit Diagnosis 1. Malignant neoplasm of lower third of esophagus (HCC)   2. Goals of care, counseling/discussion      [name], MD, MPH [facility] [phone] 06/19/2021 3:51 PM

## 2021-06-20 ENCOUNTER — Telehealth: Payer: Self-pay

## 2021-06-20 LAB — CEA: CEA: 3 ng/mL (ref 0.0–4.7)

## 2021-06-20 NOTE — Telephone Encounter (Signed)
Notified PCP office, [facility], that Mr. Oglesby can now swallow pills and they can resume all oral medications as they feel indicated. Per PharmD A, [place] he will need to see his new PCP, Sheral Apley PA, since he has not been seen since 09/2020. They will review all his medications when established. His previously PCP, is no longer at the practice. I have called and notified Mr. Dedeaux and he will make an appointment. I have also reached out to [name] with his request to have EGD's done closer to home. Per [name], he must follow their non-resection protocol and perform these EGD's. I have notified Mr. Laforge and he is agreeable. [name] office will also see him to formally go over his pathology from his biopsies and arranged follow up plan.

## 2021-06-22 ENCOUNTER — Other Ambulatory Visit: Payer: Self-pay

## 2021-06-22 ENCOUNTER — Inpatient Hospital Stay: Payer: Medicare Other

## 2021-06-22 ENCOUNTER — Other Ambulatory Visit: Payer: Self-pay | Admitting: *Deleted

## 2021-06-22 DIAGNOSIS — E860 Dehydration: Secondary | ICD-10-CM

## 2021-06-22 DIAGNOSIS — C155 Malignant neoplasm of lower third of esophagus: Secondary | ICD-10-CM

## 2021-06-22 NOTE — Progress Notes (Signed)
Orders in 

## 2021-06-22 NOTE — Progress Notes (Signed)
Nutrition Follow-up:  Patient with esophageal cancer.  PEG placed on 10/29.  Patient has completed chemotherapy and radiation.  Noted from [place] reviewed.  Not planning surgery, biopsies negative for cancer, planning rescan in 3 months.  Spoke with patient via phone. Patient reports that he has been eating orally for about 1 week and half.  Stop using all tube feeding at that time as well.  Says he is eating either cereal or biscuit for breakfast.  Skips lunch. Dinner has been cheeseburger or soup or 1/2 rack of ribs.  Has not tried steak.  Says that he is able to swallow all pills.  Says that he feels weak in legs, wants to improve strength.      Medications: reviewed  Labs: reviewed  Anthropometrics:   Weight 196 lb on 2/13  200 lb 9.6 oz on 1/20   Estimated Energy Needs  Kcals: 2250-2700 Protein: 108-135 g Fluid: 2700-3150 ml  NUTRITION DIAGNOSIS: Inadequate oral intake improving   INTERVENTION:  Orally patient not consuming enough calories to keep weight stable per 24 hour recall. Patient says he may be eating 1600 calories/day.  Do not recommend patient stop all 7 cartons of glucerna 1.5 at one time (2492 calories/day).  Recommend patient add back 3-4 cartons of glucerna 1.5 daily.  Can drink vanilla orally or give via tube.  Patient will need to not use feeding tube for nutrition and hydration for 3-4 weeks and be able to maintain weight before would make recommendation for tube to be removed.  Patient verbalized understanding.  Patient does not have scale and home. Discussed screen by OT due to weakness, wanting to improve strength.       MONITORING, EVALUATION, GOAL: weight trends, intake, tube feeding   NEXT VISIT: Friday, March 3 after radiation follow-up  Aiva Miskell B. Zenia Resides, [place], [place] Registered Dietitian [phone] (mobile)

## 2021-06-26 ENCOUNTER — Other Ambulatory Visit: Payer: Self-pay | Admitting: *Deleted

## 2021-06-26 ENCOUNTER — Telehealth: Payer: Self-pay | Admitting: *Deleted

## 2021-06-26 NOTE — Telephone Encounter (Signed)
Got in touch with son and patient was with son. Was able to give the message to patient. He agrees and verbalizes understanding of appointment details with OT

## 2021-06-26 NOTE — Telephone Encounter (Signed)
Attempted to reach patient to set up OT screen apt for patient. No answer-phone just rings. Vm box not set up.  Mychart msg sent  Apt tentatively scheduled for 2/22 at 8:30 am.

## 2021-06-28 ENCOUNTER — Other Ambulatory Visit: Payer: Self-pay

## 2021-06-28 ENCOUNTER — Inpatient Hospital Stay: Payer: Medicare Other | Admitting: Occupational Therapy

## 2021-06-28 DIAGNOSIS — M6281 Muscle weakness (generalized): Secondary | ICD-10-CM

## 2021-06-28 NOTE — Therapy (Signed)
[facility] [place], [place], [place], [address] Phone: [phone]   Fax:  [phone]  Occupational Therapy Screen:  Patient Details  Name: Alexander Tran MRN: [id number] Date of Birth: [birth year] No data recorded  Encounter Date: 06/28/2021   OT End of Session - 06/28/21 2023     Visit Number 0             Past Medical History:  Diagnosis Date   Acid reflux    Diabetes mellitus without complication ([place])    Hyperlipidemia    Hypertension    Manic depression ([place])    MI (myocardial infarction) [facility])    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[facility])     Past Surgical History:  Procedure Laterality Date   BACK SURGERY     CARDIAC CATHETERIZATION Left 11/02/2015   Procedure: Left Heart Cath and Coronary Angiography;  Surgeon: Isaias Cowman, MD;  Location: [place] CV LAB;  Service: Cardiovascular;  Laterality: Left;   ESOPHAGOGASTRODUODENOSCOPY (EGD) WITH PROPOFOL N/A 03/03/2021   Procedure: ESOPHAGOGASTRODUODENOSCOPY (EGD) WITH PROPOFOL;  Surgeon: [name], MD;  Location: [facility] ENDOSCOPY;  Service: Endoscopy;  Laterality: N/A;   GASTROSTOMY N/A 03/04/2021   Procedure: INSERTION OF GASTROSTOMY TUBE;  Surgeon: Herbert Pun, MD;  Location: [facility] ORS;  Service: General;  Laterality: N/A;   IR CM INJ ANY COLONIC TUBE W/FLUORO  03/17/2021   PORTACATH PLACEMENT Right 03/06/2021   Procedure: INSERTION PORT-A-CATH;  Surgeon: Herbert Pun, MD;  Location: [facility] ORS;  Service: General;  Laterality: Right;   SURGERY SCROTAL / TESTICULAR      There were no vitals filed for this visit.   Subjective Assessment - 06/28/21 2022     Subjective  I am trying to get rid of my feeding tube - can you weight me -I am trying to build up my strenght but my back start hurting when I walk more than 5-10 min - I live with my son and we have gym in the building    Currently in Pain? Yes    Pain Score 5     Pain Location  Back    Pain Orientation Lower    Pain Descriptors / Indicators Aching    Pain Type Acute pain    Pain Onset More than a month ago    Pain Frequency Intermittent             [name], MD, MPH [facility] at [facility] [phone] 06/19/2021 3:51 PM   Assessment and plan- Patient is a 71 y.o. 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  He is s/p concurrent CarboTaxol chemotherapy with radiation treatment.  He is here to discuss further management   Patient was found to have mild hypermetabolic node in the gastrohepaticLymph nodes on his initial PET scan which was the basis of categorizing him as an N1 disease.  He had a large fungating mass at the lower end of the esophagus causing near complete obstruction and requiring a PEG tube placement initially.  After concurrent chemoradiation patient underwent PET/CT scan at [facility] which shows complete resolution of hypermetabolic areas and no evidence of recurrent disease.  He also had a repeat EGD which did not show any residual mass and biopsies negative for malignancy.  He was evaluated by cardiothoracic surgery [name] but plan is to hold off any further surgery at this time especially given his history of ongoing smoking.  Plan is for  surveillance EGDs every 3 months with biopsies.  I will plan to obtain outside PET scan images for my review as well   We will reach out to [name] team to specify the frequency of her EGD and patient would like to get them here instead of getting that [facility] and I will reach out to [name] after I hear back from [name].   I will see him back in 3 months with CBC with differential CMP CEA and CT chest abdomen and pelvis with contrast prior.  I would like him to keep the port in place for now.  Patient is very eager to get his feeding tube out and if I hear back from [name] that his oral intake without feeding tube is meeting his nutritional  needs we will plan to remove his PEG tube   Visit Diagnosis 1. Malignant neoplasm of lower third of esophagus (HCC)   2. 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 Pt do not want to  have PT referral or CARE program at this time -want to try HEP first for 2 wks  10 meter walk test - was 8 sec - WNL  HEP :  - Sit<> stand from high chair or chair with pillow -10 reps -Nu-step or stationary bike in his gym 5-10 min and then increase gradually -walk 3-5 min  - sidestepping in hall way or at counter -1-2 min -hip ABD and ext 10 reps each - belly button in  -deep abdominal breathing  -heel lift 10 reps - at counter  Pt to do HEP for 2-3 wks and follow up                                           Visit Diagnosis: Muscle weakness (generalized)    Problem List Patient Active Problem List   Diagnosis Date Noted   Goals of care, counseling/discussion 03/20/2021   Esophageal cancer ([place]) 03/06/2021   Malnutrition of moderate degree 03/06/2021   Esophageal mass 03/03/2021   Diabetes mellitus without complication (HCC)    Hypertension    Intractable nausea and vomiting    Esophageal stricture    AKI (acute kidney injury) ([place])    Impaired swallowing    Dehydration    Hyperglycemia 04/05/2018   DM (diabetes mellitus),  secondary uncontrolled 04/04/2018   Coronary artery disease involving native coronary artery of native heart without angina pectoris 09/06/2016   Chest pain 10/28/2015    Rosalyn Gess, OTR/L,CLT 06/28/2021, 8:25 PM  [facility] [place], [place],  [place], [address] Phone: [phone]   Fax:  [phone]  Name: Glennon Kopko MRN: [id number] Date of Birth: [birth year]

## 2021-07-07 ENCOUNTER — Encounter: Payer: Self-pay | Admitting: Radiation Oncology

## 2021-07-07 ENCOUNTER — Ambulatory Visit
Admission: RE | Admit: 2021-07-07 | Discharge: 2021-07-07 | Disposition: A | Discharge status: Home / Self Care | Payer: Medicare Other | Source: Ambulatory Visit | Attending: Radiation Oncology | Admitting: Radiation Oncology

## 2021-07-07 ENCOUNTER — Other Ambulatory Visit: Payer: Self-pay

## 2021-07-07 ENCOUNTER — Ambulatory Visit: Payer: Medicare Other | Admitting: Radiation Oncology

## 2021-07-07 ENCOUNTER — Other Ambulatory Visit: Payer: Self-pay | Admitting: *Deleted

## 2021-07-07 ENCOUNTER — Inpatient Hospital Stay: Payer: Medicare Other | Attending: Oncology

## 2021-07-07 VITALS — BP 111/69 | HR 91 | Temp 95.2°F | Resp 16 | Wt 200.3 lb

## 2021-07-07 DIAGNOSIS — C155 Malignant neoplasm of lower third of esophagus: Secondary | ICD-10-CM | POA: Insufficient documentation

## 2021-07-07 DIAGNOSIS — Z923 Personal history of irradiation: Secondary | ICD-10-CM | POA: Diagnosis not present

## 2021-07-07 NOTE — Progress Notes (Signed)
Radiation Oncology ?Follow up Note ? ?Name: Alexander Tran   ?Date:   07/07/2021 ?MRN:  [id number] ?DOB: Dec 19, 1950  ? ? ?This 71 y.o. male presents to the clinic today for 1 month follow-up status post radiation therapy to his distal esophagus for moderately differentiated adenocarcinoma.  Patient's stage is likely stage IIIc (T3 N1 M0) ? ?REFERRING PROVIDER: Cherrie Distance, MD ? ?HPI: Patient is a [age]-year-old male now at 1 month having completed concurrent chemoradiation therapy for a distal esophageal adenocarcinoma.Marland Kitchen  He is seen today in routine follow-up.  Has been evaluated at [facility] for surgery although had a PET CT scan showing complete resolution of hypermetabolic activity in the distal esophagus and no evidence of distant metastasis.  He also had upper endoscopy which did not show any residual mass in the GE junction.  Biopsies were negative.  He is eating well weight is improving.  He is otherwise without complaints. ? ?COMPLICATIONS OF TREATMENT: none ? ?FOLLOW UP COMPLIANCE: keeps appointments  ? ?PHYSICAL EXAM:  ?BP 111/69 (BP Location: Left Arm, Patient Position: Sitting)   Pulse 91   Temp (!) 95.2 ?F (35.1 ?C) (Tympanic)   Resp 16   Wt 200 lb 4.8 oz (90.9 kg)   BMI 23.75 kg/m?  ?Well-developed well-nourished patient in NAD. HEENT reveals PERLA, EOMI, discs not visualized.  Oral cavity is clear. No oral mucosal lesions are identified. Neck is clear without evidence of cervical or supraclavicular adenopathy. Lungs are clear to A&P. Cardiac examination is essentially unremarkable with regular rate and rhythm without murmur rub or thrill. Abdomen is benign with no organomegaly or masses noted. Motor sensory and DTR levels are equal and symmetric in the upper and lower extremities. Cranial nerves II through XII are grossly intact. Proprioception is intact. No peripheral adenopathy or edema is identified. No motor or sensory levels are noted. Crude visual fields are within normal range. ? ?RADIOLOGY  RESULTS: PET reports and pathology reports from [facility] reviewed compatible with above-stated findings ? ?PLAN: Present time patient had a complete response to combined modality therapy.  On pleased with his overall progress he is asymptomatic this time and putting on weight.  And pleased with his overall performance.  He will have repeat scans in May.  I have asked to see him back in 3 to 4 months for follow-up.  He continues close follow-up care with Dr. Judithann Graves. ? ?I would like to take this opportunity to thank you for allowing me to participate in the care of your patient.. ?  ? Noreene Filbert, MD ? ?

## 2021-07-07 NOTE — Progress Notes (Signed)
Nutrition Follow-up: ? ?Patient with esophageal cancer.  PEG placed on 10/29.  Patient has completed chemotherapy and radiation.  Not planning surgery, biopsies negative for cancer planning rescan in 3 months.   ? ?Met with patient following radiation follow-up.  Patient says that he has not been using feeding tube for at least 3 weeks.  Did use it for 1 carton after we talked on 2/16 but then decided not to use tube.  "I want this tube out."  Says that he is eating toasted muffin, bowl of cheerios for breakfast. Lunch is usually a sandwich.  Dinner is soup, pasta dish, hamburger, vegetables (potato, green beans, broccoli, chicken pot pie.  Has not tried steak.  Says that he is not drinking any oral nutrition supplements.   ? ?Concerned that he is receiving bills from [place].   ? ?Patient is working with OT and doing exercises.   ? ? ? ?Medications: reviewed ? ?Labs: reviewed ? ?Anthropometrics:  ? ?Weight today 200 lb 3 oz in radiation, increased ? ?196 lb on 2/13 ?200 lb 9.6 oz on 1/20 ? ?Patient says he wants to stay around this weight.   ? ? ?NUTRITION DIAGNOSIS: Inadequate oral intake improved ? ? ?INTERVENTION:  ?With increased weight, improved oral intake and not using feeding tube for at least 3 weeks recommend for feeding tube to be removed.  Message sent to Dr Janese Banks.  Patient will need to flush tube with 80m daily until removal.  ?Discussed with patient importance of monitoring blood glucose with improved po intake.  ?Discussed that he can start low sugar oral nutrition supplements for additional calories and protein.  ?Discussed importance of compliance with good nutrition and following OT's recommendations to help patient improve strength and endurance.   ?Instructed [place] to [place].   ?Patient has contact information.  ? ?  ? ?MONITORING, EVALUATION, GOAL: weight trends, intake ? ? ?NEXT VISIT: no follow-up planned ?Patient to call RD if needed ? ?Taeja Debellis B. AZenia Resides RD,  LDN ?Registered Dietitian ?[phone](mobile) ? ? ?

## 2021-07-11 ENCOUNTER — Telehealth: Payer: Self-pay | Admitting: *Deleted

## 2021-07-11 NOTE — Telephone Encounter (Signed)
Pt called earlier today to ask about if he can get an order to take out port and tube feeding at the same time and Dr. Peyton Najjar has given him a date for this Thursday. I called him back and spoke to him . I told him that I spoke to him yesterday and he said that would work out perfectly because he has scan on wed 3/8. I told him that I am looking in the computer and I do not see the ct scan for wed 3/8. He says he will look it up on mychart and  he did and he says he has an appt for 3/9 at 9:30 and that is for the OT with Gwenette Greet. He now sees this. I told him he is having ct 5/8. He now sees that also. He will keep the port in until after his scans to make sure everything is going good. He is going to have tube feeding removed on this Thursday. ?

## 2021-07-12 ENCOUNTER — Inpatient Hospital Stay: Payer: Medicare Other | Admitting: Occupational Therapy

## 2021-07-12 ENCOUNTER — Other Ambulatory Visit: Payer: Self-pay

## 2021-07-12 DIAGNOSIS — Z9221 Personal history of antineoplastic chemotherapy: Secondary | ICD-10-CM | POA: Diagnosis not present

## 2021-07-12 DIAGNOSIS — C159 Malignant neoplasm of esophagus, unspecified: Secondary | ICD-10-CM | POA: Diagnosis not present

## 2021-07-12 DIAGNOSIS — F17200 Nicotine dependence, unspecified, uncomplicated: Secondary | ICD-10-CM | POA: Diagnosis not present

## 2021-07-12 DIAGNOSIS — Z08 Encounter for follow-up examination after completed treatment for malignant neoplasm: Secondary | ICD-10-CM | POA: Diagnosis not present

## 2021-07-12 DIAGNOSIS — Z923 Personal history of irradiation: Secondary | ICD-10-CM | POA: Diagnosis not present

## 2021-07-12 DIAGNOSIS — Z8501 Personal history of malignant neoplasm of esophagus: Secondary | ICD-10-CM | POA: Diagnosis not present

## 2021-07-12 DIAGNOSIS — M6281 Muscle weakness (generalized): Secondary | ICD-10-CM

## 2021-07-12 NOTE — Therapy (Signed)
[place] [facility] ?[address], [address] ?[place], [place], [address] ?Phone: [phone]   Fax:  [phone] ? ?Occupational Therapy Screen: ? ?Patient Details  ?Name: Alexander Tran ?MRN: [id number] ?Date of Birth: 01/04/1951 ?No data recorded ? ?Encounter Date: 07/12/2021 ? ? OT End of Session - 07/12/21 1750   ? ? Visit Number 0   ? ?  ?  ? ?  ? ? ?Past Medical History:  ?Diagnosis Date  ? Acid reflux   ? Diabetes mellitus without complication ([place])   ? Hyperlipidemia   ? Hypertension   ? Manic depression ([place])   ? MI (myocardial infarction) ([place])   ? 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 ? ?Past Surgical History:  ?Procedure Laterality Date  ? BACK SURGERY    ? CARDIAC CATHETERIZATION Left 11/02/2015  ? Procedure: Left Heart Cath and Coronary Angiography;  Surgeon: Isaias Cowman, MD;  Location: [place] CV LAB;  Service: Cardiovascular;  Laterality: Left;  ? ESOPHAGOGASTRODUODENOSCOPY (EGD) WITH PROPOFOL N/A 03/03/2021  ? Procedure: ESOPHAGOGASTRODUODENOSCOPY (EGD) WITH PROPOFOL;  Surgeon: [name], MD;  Location: [facility] ENDOSCOPY;  Service: Endoscopy;  Laterality: N/A;  ? GASTROSTOMY N/A 03/04/2021  ? Procedure: INSERTION OF GASTROSTOMY TUBE;  Surgeon: Herbert Pun, MD;  Location: [facility] ORS;  Service: General;  Laterality: N/A;  ? IR CM INJ ANY COLONIC TUBE W/FLUORO  03/17/2021  ? PORTACATH PLACEMENT Right 03/06/2021  ? Procedure: INSERTION PORT-A-CATH;  Surgeon: Herbert Pun, MD;  Location: [facility] ORS;  Service: General;  Laterality: Right;  ? SURGERY SCROTAL / TESTICULAR    ? ? ?There were no vitals filed for this visit. ? ? Subjective Assessment - 07/12/21 1749   ? ? Subjective  I am doing better since of seeing it 2 weeks ago, using the bike 5 minutes every other day in the gym walking low but further with less back pain and doing other exercises.  Getting my feeding tube out tomorrow, has to wait with my Chemo-Port.  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 ?  ?  ? ?  ? ? ? ?[name], MD, MPH ?[name] ?[phone] ?06/19/2021 ?3:51 PM ?  ?Assessment and plan- Patient is a 71 y.o. 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  He is s/p concurrent CarboTaxol chemotherapy with radiation treatment.  He is here to discuss further management ?  ?Patient was found to have mild hypermetabolic node in the gastrohepaticLymph nodes on his initial PET scan which was the basis of categorizing him as an N1 disease.  He had a large fungating mass at the lower end of the esophagus causing near complete obstruction and requiring a PEG tube placement initially.  After concurrent chemoradiation patient underwent PET/CT scan at [facility] which shows complete resolution of hypermetabolic areas and no evidence of recurrent disease.  He also had a repeat EGD which did not show any residual mass and biopsies negative for malignancy.  He was evaluated by cardiothoracic surgery [name] but plan is to hold off any further surgery at this time especially given his history of ongoing smoking.  Plan is for surveillance EGDs every 3 months with biopsies.  I will plan to obtain outside PET scan images for my review as well ?  ?We will reach out to [name] team to specify the frequency of her  EGD and patient would like to get them here instead of getting that at [name] and I will reach out to [name] after I hear back from [name]. ?  ?I will see him back in 3 months with CBC with differential CMP CEA and CT chest abdomen and pelvis with contrast prior.  I would like him to keep the port in place for now.  Patient is very eager to get his feeding tube out and if I hear back from [name] that his oral intake without feeding tube is meeting his nutritional needs we will plan to remove his PEG tube ?  ?Visit Diagnosis ?1. Malignant neoplasm of lower third of esophagus  (HCC)   ?2. Goals of care, counseling/discussion   ?  ?  ? 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 ?Pt do not want to  have PT referral or CARE program at this time -want to try HEP first for 2 wks  ?10 meter walk test - was 8 sec - WNL  ?HEP : ? - Sit<> stand from high chair or chair with pillow -10 reps ?-Nu-step or stationary bike in his gym ?5-10 min and then increase gradually ?-walk 3-5 min  ?- sidestepping in hall way or at counter -1-2 min ?-hip ABD and ext 10 reps each - belly button in  ?-deep abdominal breathing  ?-heel lift 10 reps - at counter  ?Pt to do HEP for 2-3 wks and follow up  ? ?OT SCREEN 07/12/21: ?Patient here for follow-up after seen 2 weeks ago doing home exercises per patient's request ?Patient report able to bike and walk more than 5 minutes with less back pain.  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 - Sit<> stand from high chair or chair with pillow -10 reps ?-Nu-step or stationary bike in his gym ?10 -15 min and then increase gradually ?-walk 09-13-13 min  ?- sidestepping or squats -1-2 min ?-hip ABD and ext 10 reps each - belly button in  ?-deep abdominal breathing  ?-heel lift 10 reps - at counter  ?-Green Thera-Band for scapular squeezes, shoulder extension, lats pull downs 1 set 02 sets of  10 pain-free ?Pt to do HEP for month and follow up if needed  ? ?  ?  ? ? ? ? ? ? ? ? ? ? ? ? ? ? ? ? ? ? ? ? ? ? ? ? ? ? ? ? ? ? ?  ?  ?  ? ? ?Visit Diagnosis: ?Muscle weakness (generalized) ? ? ? ?Problem List ?Patient Active Problem List  ? Diagnosis Date Noted  ? Goals of care, counseling/discussion 03/20/2021  ? Esophageal cancer ([place]) 03/06/2021  ? Malnutrition of moderate degree 03/06/2021  ? Esophageal mass 03/03/2021  ? Diabetes mellitus without complication ([place])   ? Hypertension   ? Intractable nausea and vomiting   ? Esophageal stricture   ? AKI (acute kidney injury) ([place])   ? Impaired swallowing   ? Dehydration   ? Hyperglycemia 04/05/2018  ? DM (diabetes mellitus), secondary uncontrolled 04/04/2018  ? Coronary artery disease involving native coronary artery of native heart without angina pectoris 09/06/2016  ? Chest pain 10/28/2015  ? ? ?Rosalyn Gess, OTR/L,CLT ?07/12/2021, 5:51 PM ? ?[place] ?[facility] ?[address], [address] ?[place], [place], [address] ?Phone: [phone]   Fax:  [phone] ? ?Name: Radley Barto ?MRN: [id number] ?Date of Birth: [birth year] ? ?

## 2021-07-13 ENCOUNTER — Telehealth: Payer: Self-pay

## 2021-07-13 DIAGNOSIS — Z931 Gastrostomy status: Secondary | ICD-10-CM | POA: Diagnosis not present

## 2021-07-13 NOTE — Telephone Encounter (Signed)
I called and spoke with [place] office and they stated patient came in at 2:15pm today 07/13/2021 to get feeding peg tube removed.  ?

## 2021-07-26 ENCOUNTER — Other Ambulatory Visit: Payer: Self-pay

## 2021-07-26 ENCOUNTER — Inpatient Hospital Stay: Payer: Medicare Other | Admitting: Occupational Therapy

## 2021-07-26 DIAGNOSIS — M6281 Muscle weakness (generalized): Secondary | ICD-10-CM

## 2021-07-26 NOTE — Therapy (Signed)
[place] ?[facility] ?[address], [address] ?[place], [place], [address] ?Phone: [phone]   Fax:  [phone] ? ?Occupational Therapy Treatment ? ?Patient Details  ?Name: Alexander Tran ?MRN: [id number] ?Date of Birth: 07-Aug-1950 ?No data recorded ? ?Encounter Date: 07/26/2021 ? ? OT End of Session - 07/26/21 1806   ? ? Visit Number 0   ? ?  ?  ? ?  ? ? ?Past Medical History:  ?Diagnosis Date  ? Acid reflux   ? Diabetes mellitus without complication ([place])   ? Hyperlipidemia   ? Hypertension   ? Manic depression ([place])   ? MI (myocardial infarction) ([place])   ? 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 ? ?Past Surgical History:  ?Procedure Laterality Date  ? BACK SURGERY    ? CARDIAC CATHETERIZATION Left 11/02/2015  ? Procedure: Left Heart Cath and Coronary Angiography;  Surgeon: Isaias Cowman, MD;  Location: [facility];  Service: Cardiovascular;  Laterality: Left;  ? ESOPHAGOGASTRODUODENOSCOPY (EGD) WITH PROPOFOL N/A 03/03/2021  ? Procedure: ESOPHAGOGASTRODUODENOSCOPY (EGD) WITH PROPOFOL;  Surgeon: [name], MD;  Location: [facility];  Service: Endoscopy;  Laterality: N/A;  ? GASTROSTOMY N/A 03/04/2021  ? Procedure: INSERTION OF GASTROSTOMY TUBE;  Surgeon: Herbert Pun, MD;  Location: [facility];  Service: General;  Laterality: N/A;  ? IR CM INJ ANY COLONIC TUBE W/FLUORO  03/17/2021  ? PORTACATH PLACEMENT Right 03/06/2021  ? Procedure: INSERTION PORT-A-CATH;  Surgeon: Herbert Pun, MD;  Location: [facility];  Service: General;  Laterality: Right;  ? SURGERY SCROTAL / TESTICULAR    ? ? ?There were no vitals filed for this visit. ? ? Subjective Assessment - 07/26/21 1803   ? ? Subjective  I will feel much better since the exercises you gave me, I have more energy I feel like I can walk longer but my back is still the issue.  I am meeting with my boss tomorrow but going back to driving.  I love the band exercises that you gave me.  I had my feeding tube  taken out, can we weigh me.  I am eating about 2 meals, breakfast and dinner.   ? Currently in Pain? No/denies   ? ?  ?  ? ?  ? ? ? ? ?[name], MD, MPH ?[facility] ?[phone] ?06/19/2021 ?3:51 PM ?  ?Assessment and plan- Patient is a 71 y.o. 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  He is s/p concurrent CarboTaxol chemotherapy with radiation treatment.  He is here to discuss further management ?  ?Patient was found to have mild hypermetabolic node in the gastrohepaticLymph nodes on his initial PET scan which was the basis of categorizing him as an N1 disease.  He had a large fungating mass at the lower end of the esophagus causing near complete obstruction and requiring a PEG tube placement initially.  After concurrent chemoradiation patient underwent PET/[facility] scan at [facility] which shows complete resolution of hypermetabolic areas and no evidence of recurrent disease.  He also had a repeat EGD which did not show any residual mass and biopsies negative for malignancy.  He was evaluated by cardiothoracic surgery [name] but plan is to hold off any further surgery at this time especially given his history of ongoing smoking.  Plan is for surveillance EGDs every 3 months with biopsies.  I will plan to obtain outside PET scan images for my review as well ?  ?  We will reach out to [name] team to specify the frequency of her EGD and patient would like to get them here instead of getting that at [facility] and I will reach out to [name] after I hear back from [name]. ?  ?I will see him back in 3 months with CBC with differential CMP CEA and CT chest abdomen and pelvis with contrast prior.  I would like him to keep the port in place for now.  Patient is very eager to get his feeding tube out and if I hear back from [name] that his oral intake without feeding tube is meeting his nutritional needs we will plan to remove his PEG tube ?  ?Visit  Diagnosis ?1. Malignant neoplasm of lower third of esophagus (HCC)   ?2. Goals of care, counseling/discussion   ?  ?  ? 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 ?Pt do not want to  have PT referral or CARE program at this time -want to try HEP first for 2 wks  ?10 meter walk test - was 8 sec - WNL  ?HEP : ? - Sit<> stand from high chair or chair with pillow -10 reps ?-Nu-step or stationary bike in his gym ?5-10 min and then increase gradually ?-walk 3-5 min  ?- sidestepping in hall way or at counter -1-2 min ?-hip ABD and ext 10 reps each - belly button in  ?-deep abdominal breathing  ?-heel lift 10 reps - at counter  ?Pt to do HEP for 2-3 wks and follow up  ?  ?OT SCREEN 07/12/21: ?Patient here for follow-up after seen 2 weeks ago doing home exercises per patient's request ?Patient report able to bike and walk more than 5 minutes with less back pain.  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 - Sit<> stand from high chair or chair with pillow -10 reps ?-Nu-step or stationary bike in his gym ?10 -15 min and then increase gradually ?-walk 09-13-13 min  ?- sidestepping or squats -1-2 min ?-hip ABD and ext 10 reps each - belly button in  ?-deep abdominal breathing  ?-heel lift 10 reps - at counter  ?-  Green Thera-Band for scapular squeezes, shoulder extension, lats pull downs 1 set 02 sets of 10 pain-free ?Pt to do HEP for month and follow up if needed  ?  ?  ?OT SCREEN 3/822/23: ?Patient returns today after having feeding tube taken out.  Patient weighted himself lost 3 pounds.  Patient reports only eating 2 meals, breakfast and dinner.  Offered for patient to see dietitian], patient declined.  Patient remarked that that is more active and needed to eat more little meals may be as well as add protein. ?Patient continues to be limited by distance of walking or biking with back pain.  Patient declined physical therapy or care program referral. ?Patient wants to continue with same home exercises ?Denies any falls ?Patient to increase gradually with walking if back allows him ?Recommend also when spring summer comes he has swimming pool access-we will excellent be for him to work out in or walk ?Patient with home program can contact me if needed-patient in agreement ? ? ? ? ? ? ? ? ? ? ? ? ? ? ? ? ? ? ? ? ? ? ? ? ? ? ? ? ? ? ?  ?  ? ? ?Visit Diagnosis: ?Muscle weakness (generalized) ? ? ? ?Problem List ?Patient Active Problem List  ? Diagnosis Date Noted  ? Goals of care, counseling/discussion 03/20/2021  ? Esophageal cancer ([place]) 03/06/2021  ? Malnutrition of moderate degree 03/06/2021  ? Esophageal mass 03/03/2021  ? Diabetes mellitus without complication ([place])   ? Hypertension   ? Intractable nausea and vomiting   ? Esophageal stricture   ? AKI (acute kidney injury) ([place])   ? Impaired  swallowing   ? Dehydration   ? Hyperglycemia 04/05/2018  ? DM (diabetes mellitus), secondary uncontrolled 04/04/2018  ? Coronary artery disease involving native coronary artery of native heart without angin

## 2021-08-08 DIAGNOSIS — F17210 Nicotine dependence, cigarettes, uncomplicated: Secondary | ICD-10-CM | POA: Diagnosis not present

## 2021-08-08 DIAGNOSIS — Z86010 Personal history of colonic polyps: Secondary | ICD-10-CM | POA: Diagnosis not present

## 2021-08-08 DIAGNOSIS — Z720 Tobacco use: Secondary | ICD-10-CM | POA: Diagnosis not present

## 2021-08-08 DIAGNOSIS — E039 Hypothyroidism, unspecified: Secondary | ICD-10-CM | POA: Diagnosis not present

## 2021-08-08 DIAGNOSIS — E119 Type 2 diabetes mellitus without complications: Secondary | ICD-10-CM | POA: Diagnosis not present

## 2021-08-08 DIAGNOSIS — I2510 Atherosclerotic heart disease of native coronary artery without angina pectoris: Secondary | ICD-10-CM | POA: Diagnosis not present

## 2021-08-08 DIAGNOSIS — D649 Anemia, unspecified: Secondary | ICD-10-CM | POA: Diagnosis not present

## 2021-08-08 DIAGNOSIS — Z7984 Long term (current) use of oral hypoglycemic drugs: Secondary | ICD-10-CM | POA: Diagnosis not present

## 2021-08-08 DIAGNOSIS — Z23 Encounter for immunization: Secondary | ICD-10-CM | POA: Diagnosis not present

## 2021-08-08 DIAGNOSIS — C160 Malignant neoplasm of cardia: Secondary | ICD-10-CM | POA: Diagnosis not present

## 2021-08-08 DIAGNOSIS — Z8501 Personal history of malignant neoplasm of esophagus: Secondary | ICD-10-CM | POA: Diagnosis not present

## 2021-08-08 DIAGNOSIS — E785 Hyperlipidemia, unspecified: Secondary | ICD-10-CM | POA: Diagnosis not present

## 2021-08-08 DIAGNOSIS — I10 Essential (primary) hypertension: Secondary | ICD-10-CM | POA: Diagnosis not present

## 2021-08-08 DIAGNOSIS — D126 Benign neoplasm of colon, unspecified: Secondary | ICD-10-CM | POA: Diagnosis not present

## 2021-08-08 DIAGNOSIS — Z79899 Other long term (current) drug therapy: Secondary | ICD-10-CM | POA: Diagnosis not present

## 2021-08-08 DIAGNOSIS — D7589 Other specified diseases of blood and blood-forming organs: Secondary | ICD-10-CM | POA: Diagnosis not present

## 2021-08-15 ENCOUNTER — Telehealth: Payer: Self-pay

## 2021-08-15 NOTE — Telephone Encounter (Signed)
CALLED PATIENT NO ANSWER LEFT VOICEMAIL FOR A CALL BACK ? ?

## 2021-08-31 DIAGNOSIS — M545 Low back pain, unspecified: Secondary | ICD-10-CM | POA: Diagnosis not present

## 2021-08-31 DIAGNOSIS — M5440 Lumbago with sciatica, unspecified side: Secondary | ICD-10-CM | POA: Diagnosis not present

## 2021-08-31 DIAGNOSIS — G8929 Other chronic pain: Secondary | ICD-10-CM | POA: Diagnosis not present

## 2021-09-05 ENCOUNTER — Other Ambulatory Visit: Payer: Self-pay

## 2021-09-05 ENCOUNTER — Ambulatory Visit
Admission: RE | Admit: 2021-09-05 | Discharge: 2021-09-05 | Disposition: A | Discharge status: Home / Self Care | Payer: Medicare Other | Source: Ambulatory Visit | Attending: Oncology | Admitting: Oncology

## 2021-09-05 DIAGNOSIS — N3289 Other specified disorders of bladder: Secondary | ICD-10-CM | POA: Diagnosis not present

## 2021-09-05 DIAGNOSIS — J90 Pleural effusion, not elsewhere classified: Secondary | ICD-10-CM | POA: Diagnosis not present

## 2021-09-05 DIAGNOSIS — J9811 Atelectasis: Secondary | ICD-10-CM | POA: Diagnosis not present

## 2021-09-05 DIAGNOSIS — C155 Malignant neoplasm of lower third of esophagus: Secondary | ICD-10-CM | POA: Diagnosis not present

## 2021-09-05 DIAGNOSIS — J432 Centrilobular emphysema: Secondary | ICD-10-CM | POA: Diagnosis not present

## 2021-09-05 DIAGNOSIS — K3189 Other diseases of stomach and duodenum: Secondary | ICD-10-CM | POA: Diagnosis not present

## 2021-09-05 DIAGNOSIS — M160 Bilateral primary osteoarthritis of hip: Secondary | ICD-10-CM | POA: Diagnosis not present

## 2021-09-05 LAB — POCT I-STAT CREATININE: Creatinine, Ser: 1.1 mg/dL (ref 0.61–1.24)

## 2021-09-05 MED ORDER — IOHEXOL 300 MG/ML  SOLN
100.0000 mL | Freq: Once | INTRAMUSCULAR | Status: AC | PRN
  Administered 2021-09-05: 100 mL via INTRAVENOUS

## 2021-09-05 MED ORDER — HEPARIN SOD (PORK) LOCK FLUSH 100 UNIT/ML IV SOLN
500.0000 [IU] | Freq: Once | INTRAVENOUS | Status: AC
  Administered 2021-09-05: 500 [IU] via INTRAVENOUS

## 2021-09-06 DIAGNOSIS — I10 Essential (primary) hypertension: Secondary | ICD-10-CM | POA: Diagnosis not present

## 2021-09-06 DIAGNOSIS — E119 Type 2 diabetes mellitus without complications: Secondary | ICD-10-CM | POA: Diagnosis not present

## 2021-09-06 DIAGNOSIS — F17210 Nicotine dependence, cigarettes, uncomplicated: Secondary | ICD-10-CM | POA: Diagnosis not present

## 2021-09-06 DIAGNOSIS — C159 Malignant neoplasm of esophagus, unspecified: Secondary | ICD-10-CM | POA: Diagnosis not present

## 2021-09-06 DIAGNOSIS — Z7984 Long term (current) use of oral hypoglycemic drugs: Secondary | ICD-10-CM | POA: Diagnosis not present

## 2021-09-06 DIAGNOSIS — Z720 Tobacco use: Secondary | ICD-10-CM | POA: Diagnosis not present

## 2021-09-06 DIAGNOSIS — I2510 Atherosclerotic heart disease of native coronary artery without angina pectoris: Secondary | ICD-10-CM | POA: Diagnosis not present

## 2021-09-06 DIAGNOSIS — Z01818 Encounter for other preprocedural examination: Secondary | ICD-10-CM | POA: Diagnosis not present

## 2021-09-06 DIAGNOSIS — T451X5A Adverse effect of antineoplastic and immunosuppressive drugs, initial encounter: Secondary | ICD-10-CM | POA: Diagnosis not present

## 2021-09-06 DIAGNOSIS — C155 Malignant neoplasm of lower third of esophagus: Secondary | ICD-10-CM | POA: Diagnosis not present

## 2021-09-06 DIAGNOSIS — E039 Hypothyroidism, unspecified: Secondary | ICD-10-CM | POA: Diagnosis not present

## 2021-09-06 DIAGNOSIS — Z7982 Long term (current) use of aspirin: Secondary | ICD-10-CM | POA: Diagnosis not present

## 2021-09-06 DIAGNOSIS — Z79899 Other long term (current) drug therapy: Secondary | ICD-10-CM | POA: Diagnosis not present

## 2021-09-06 DIAGNOSIS — K219 Gastro-esophageal reflux disease without esophagitis: Secondary | ICD-10-CM | POA: Diagnosis not present

## 2021-09-06 DIAGNOSIS — D6481 Anemia due to antineoplastic chemotherapy: Secondary | ICD-10-CM | POA: Diagnosis not present

## 2021-09-07 ENCOUNTER — Inpatient Hospital Stay: Admission: RE | Admit: 2021-09-07 | Payer: Medicare Other | Source: Ambulatory Visit

## 2021-09-07 DIAGNOSIS — K229 Disease of esophagus, unspecified: Secondary | ICD-10-CM | POA: Diagnosis not present

## 2021-09-07 DIAGNOSIS — E039 Hypothyroidism, unspecified: Secondary | ICD-10-CM | POA: Diagnosis not present

## 2021-09-07 DIAGNOSIS — G8929 Other chronic pain: Secondary | ICD-10-CM | POA: Diagnosis not present

## 2021-09-07 DIAGNOSIS — Z7982 Long term (current) use of aspirin: Secondary | ICD-10-CM | POA: Diagnosis not present

## 2021-09-07 DIAGNOSIS — M47819 Spondylosis without myelopathy or radiculopathy, site unspecified: Secondary | ICD-10-CM | POA: Diagnosis not present

## 2021-09-07 DIAGNOSIS — E119 Type 2 diabetes mellitus without complications: Secondary | ICD-10-CM | POA: Diagnosis not present

## 2021-09-07 DIAGNOSIS — C159 Malignant neoplasm of esophagus, unspecified: Secondary | ICD-10-CM | POA: Diagnosis not present

## 2021-09-07 DIAGNOSIS — G4733 Obstructive sleep apnea (adult) (pediatric): Secondary | ICD-10-CM | POA: Diagnosis not present

## 2021-09-07 DIAGNOSIS — K2289 Other specified disease of esophagus: Secondary | ICD-10-CM | POA: Diagnosis not present

## 2021-09-07 DIAGNOSIS — I252 Old myocardial infarction: Secondary | ICD-10-CM | POA: Diagnosis not present

## 2021-09-07 DIAGNOSIS — Z9889 Other specified postprocedural states: Secondary | ICD-10-CM | POA: Diagnosis not present

## 2021-09-07 DIAGNOSIS — B9689 Other specified bacterial agents as the cause of diseases classified elsewhere: Secondary | ICD-10-CM | POA: Diagnosis not present

## 2021-09-07 DIAGNOSIS — Z85828 Personal history of other malignant neoplasm of skin: Secondary | ICD-10-CM | POA: Diagnosis not present

## 2021-09-07 DIAGNOSIS — Z8501 Personal history of malignant neoplasm of esophagus: Secondary | ICD-10-CM | POA: Diagnosis not present

## 2021-09-07 DIAGNOSIS — J449 Chronic obstructive pulmonary disease, unspecified: Secondary | ICD-10-CM | POA: Diagnosis not present

## 2021-09-07 DIAGNOSIS — I2510 Atherosclerotic heart disease of native coronary artery without angina pectoris: Secondary | ICD-10-CM | POA: Diagnosis not present

## 2021-09-07 DIAGNOSIS — Z923 Personal history of irradiation: Secondary | ICD-10-CM | POA: Diagnosis not present

## 2021-09-07 DIAGNOSIS — K219 Gastro-esophageal reflux disease without esophagitis: Secondary | ICD-10-CM | POA: Diagnosis not present

## 2021-09-07 DIAGNOSIS — Z9221 Personal history of antineoplastic chemotherapy: Secondary | ICD-10-CM | POA: Diagnosis not present

## 2021-09-07 DIAGNOSIS — R54 Age-related physical debility: Secondary | ICD-10-CM | POA: Diagnosis not present

## 2021-09-07 DIAGNOSIS — D6481 Anemia due to antineoplastic chemotherapy: Secondary | ICD-10-CM | POA: Diagnosis not present

## 2021-09-07 DIAGNOSIS — F17210 Nicotine dependence, cigarettes, uncomplicated: Secondary | ICD-10-CM | POA: Diagnosis not present

## 2021-09-07 DIAGNOSIS — T451X5A Adverse effect of antineoplastic and immunosuppressive drugs, initial encounter: Secondary | ICD-10-CM | POA: Diagnosis not present

## 2021-09-07 DIAGNOSIS — M1612 Unilateral primary osteoarthritis, left hip: Secondary | ICD-10-CM | POA: Diagnosis not present

## 2021-09-07 DIAGNOSIS — E785 Hyperlipidemia, unspecified: Secondary | ICD-10-CM | POA: Diagnosis not present

## 2021-09-07 DIAGNOSIS — I10 Essential (primary) hypertension: Secondary | ICD-10-CM | POA: Diagnosis not present

## 2021-09-07 DIAGNOSIS — Z720 Tobacco use: Secondary | ICD-10-CM | POA: Diagnosis not present

## 2021-09-08 ENCOUNTER — Other Ambulatory Visit: Payer: Self-pay

## 2021-09-08 ENCOUNTER — Encounter: Payer: Self-pay | Admitting: Oncology

## 2021-09-08 ENCOUNTER — Telehealth: Payer: Self-pay

## 2021-09-08 DIAGNOSIS — M5136 Other intervertebral disc degeneration, lumbar region: Secondary | ICD-10-CM | POA: Diagnosis not present

## 2021-09-08 DIAGNOSIS — M47816 Spondylosis without myelopathy or radiculopathy, lumbar region: Secondary | ICD-10-CM | POA: Diagnosis not present

## 2021-09-08 DIAGNOSIS — M5416 Radiculopathy, lumbar region: Secondary | ICD-10-CM | POA: Diagnosis not present

## 2021-09-08 DIAGNOSIS — C155 Malignant neoplasm of lower third of esophagus: Secondary | ICD-10-CM

## 2021-09-08 DIAGNOSIS — M79605 Pain in left leg: Secondary | ICD-10-CM | POA: Diagnosis not present

## 2021-09-08 DIAGNOSIS — M4316 Spondylolisthesis, lumbar region: Secondary | ICD-10-CM | POA: Diagnosis not present

## 2021-09-08 DIAGNOSIS — M9973 Connective tissue and disc stenosis of intervertebral foramina of lumbar region: Secondary | ICD-10-CM | POA: Diagnosis not present

## 2021-09-08 NOTE — Telephone Encounter (Signed)
Have sent Dr. Einar Crow office a request to remove pts port. Receieved a fax confirmation at [phone] ?

## 2021-09-08 NOTE — Telephone Encounter (Signed)
Ok to get port out

## 2021-09-11 ENCOUNTER — Ambulatory Visit: Payer: Medicare Other

## 2021-09-11 ENCOUNTER — Inpatient Hospital Stay: Payer: Medicare Other | Attending: Oncology

## 2021-09-11 DIAGNOSIS — E119 Type 2 diabetes mellitus without complications: Secondary | ICD-10-CM | POA: Diagnosis not present

## 2021-09-11 DIAGNOSIS — C160 Malignant neoplasm of cardia: Secondary | ICD-10-CM | POA: Diagnosis not present

## 2021-09-11 DIAGNOSIS — I10 Essential (primary) hypertension: Secondary | ICD-10-CM | POA: Insufficient documentation

## 2021-09-11 DIAGNOSIS — C155 Malignant neoplasm of lower third of esophagus: Secondary | ICD-10-CM

## 2021-09-11 LAB — CBC WITH DIFFERENTIAL/PLATELET
Abs Immature Granulocytes: 0.06 10*3/uL (ref 0.00–0.07)
Basophils Absolute: 0 10*3/uL (ref 0.0–0.1)
Basophils Relative: 1 %
Eosinophils Absolute: 0.4 10*3/uL (ref 0.0–0.5)
Eosinophils Relative: 6 %
HCT: 37.9 % — ABNORMAL LOW (ref 39.0–52.0)
Hemoglobin: 13.3 g/dL (ref 13.0–17.0)
Immature Granulocytes: 1 %
Lymphocytes Relative: 12 %
Lymphs Abs: 0.8 10*3/uL (ref 0.7–4.0)
MCH: 34.4 pg — ABNORMAL HIGH (ref 26.0–34.0)
MCHC: 35.1 g/dL (ref 30.0–36.0)
MCV: 97.9 fL (ref 80.0–100.0)
Monocytes Absolute: 0.6 10*3/uL (ref 0.1–1.0)
Monocytes Relative: 10 %
Neutro Abs: 4.6 10*3/uL (ref 1.7–7.7)
Neutrophils Relative %: 70 %
Platelets: 230 10*3/uL (ref 150–400)
RBC: 3.87 MIL/uL — ABNORMAL LOW (ref 4.22–5.81)
RDW: 15.2 % (ref 11.5–15.5)
WBC: 6.4 10*3/uL (ref 4.0–10.5)
nRBC: 0 % (ref 0.0–0.2)

## 2021-09-11 LAB — COMPREHENSIVE METABOLIC PANEL
ALT: 19 U/L (ref 0–44)
AST: 20 U/L (ref 15–41)
Albumin: 3.8 g/dL (ref 3.5–5.0)
Alkaline Phosphatase: 70 U/L (ref 38–126)
Anion gap: 7 (ref 5–15)
BUN: 25 mg/dL — ABNORMAL HIGH (ref 8–23)
CO2: 24 mmol/L (ref 22–32)
Calcium: 9 mg/dL (ref 8.9–10.3)
Chloride: 101 mmol/L (ref 98–111)
Creatinine, Ser: 1.08 mg/dL (ref 0.61–1.24)
GFR, Estimated: >60 mL/min (ref >60)
Glucose, Bld: 173 mg/dL — ABNORMAL HIGH (ref 70–99)
Potassium: 4.8 mmol/L (ref 3.5–5.1)
Sodium: 132 mmol/L — ABNORMAL LOW (ref 135–145)
Total Bilirubin: 0.8 mg/dL (ref 0.3–1.2)
Total Protein: 7 g/dL (ref 6.5–8.1)

## 2021-09-11 MED ORDER — SODIUM CHLORIDE 0.9% FLUSH
10.0000 mL | Freq: Once | INTRAVENOUS | Status: AC
  Administered 2021-09-11: 10 mL via INTRAVENOUS
  Filled 2021-09-11: qty 10

## 2021-09-11 MED ORDER — HEPARIN SOD (PORK) LOCK FLUSH 100 UNIT/ML IV SOLN
500.0000 [IU] | Freq: Once | INTRAVENOUS | Status: AC
  Administered 2021-09-11: 500 [IU] via INTRAVENOUS
  Filled 2021-09-11: qty 5

## 2021-09-11 NOTE — Progress Notes (Signed)
Patient came in for port flush with labs ask for MD to call with biopsy results, team notified. Discharged, stable  ?

## 2021-09-12 LAB — CEA: CEA: 2.8 ng/mL (ref 0.0–4.7)

## 2021-09-15 ENCOUNTER — Encounter: Payer: Self-pay | Admitting: Oncology

## 2021-09-15 ENCOUNTER — Encounter: Payer: Self-pay | Admitting: Surgery

## 2021-09-15 ENCOUNTER — Ambulatory Visit (INDEPENDENT_AMBULATORY_CARE_PROVIDER_SITE_OTHER): Payer: Medicare Other | Admitting: Surgery

## 2021-09-15 ENCOUNTER — Other Ambulatory Visit: Payer: Self-pay

## 2021-09-15 ENCOUNTER — Inpatient Hospital Stay: Payer: Medicare Other | Admitting: Oncology

## 2021-09-15 VITALS — BP 137/71 | HR 73 | Temp 96.7°F | Resp 16 | Ht 77.0 in | Wt 211.0 lb

## 2021-09-15 VITALS — BP 145/74 | HR 88 | Temp 98.0°F | Ht 77.0 in | Wt 208.0 lb

## 2021-09-15 DIAGNOSIS — Z452 Encounter for adjustment and management of vascular access device: Secondary | ICD-10-CM

## 2021-09-15 DIAGNOSIS — Z95828 Presence of other vascular implants and grafts: Secondary | ICD-10-CM

## 2021-09-15 DIAGNOSIS — Z8501 Personal history of malignant neoplasm of esophagus: Secondary | ICD-10-CM

## 2021-09-15 DIAGNOSIS — Z08 Encounter for follow-up examination after completed treatment for malignant neoplasm: Secondary | ICD-10-CM | POA: Diagnosis not present

## 2021-09-15 DIAGNOSIS — E119 Type 2 diabetes mellitus without complications: Secondary | ICD-10-CM | POA: Diagnosis not present

## 2021-09-15 DIAGNOSIS — C160 Malignant neoplasm of cardia: Secondary | ICD-10-CM | POA: Diagnosis not present

## 2021-09-15 DIAGNOSIS — I10 Essential (primary) hypertension: Secondary | ICD-10-CM | POA: Diagnosis not present

## 2021-09-15 NOTE — Progress Notes (Signed)
?  Procedure Date:  09/15/2021 ? ?Pre-operative Diagnosis:  Esophageal cancer s/p chemo and radiation ? ?Post-operative Diagnosis: Esophageal cancer, s/p chemo and radiation ? ?Procedure:  Right Internal Jugular port-a-cath removal ? ?Surgeon:  Melvyn Neth, MD ? ?Anesthesia:  7 ml of 1% lidocaine with epi ? ?Estimated Blood Loss:  10 ml ? ?Specimens:  None ? ?Complications:  None ? ?Indications for Procedure:  This is a 71 y.o. male with history of esophageal cancer at the GE junction, who has completed chemotherapy and radiation.  He now presents for excision of his port-a-cath.  The risks of bleeding, infection, injury to surrounding structures were discussed with the patient and he was willing to proceed. ?  ?Description of Procedure: ?The patient was correctly identified at bedside.  The patient was placed supine.  Appropriate time-outs were performed. ?  ?The right chest and neck were prepped and draped in usual sterile fashion.  Local anesthetic was infused intradermally.  A 4 cm incision was made over the patient's previous scar, and scalpel was used to dissect down the skin and subcutaneous tissue.  The capsule that had developed around the port was incised with scalpel and the port was freed from the capsule and subcutaneous tissue.  This was then pulled while applying manual pressure over the internal jugular vein entry site.  Pressure was applied for 10 minutes.  Afterwards, there was no bleeding or hematoma formation noted.  The capsule was excised using Metzenbaum scissors.  The wound cavity was then irrigated and 3-0 vicryl interrupted suture x 3 were used for hemostasis.  The wound was closed with interrupted 3-0 Vicryl followed by 4-0 subcuticular Monocryl sutures and sealed with DermaBond. ?  ?The patient tolerated the procedure well and all sharps were appropriately disposed of at the end of the case. ? ?--Patient may shower tomorrow. ?--He may apply ice pack over the wound to help with  swelling/bruising. ?--May take Tylenol or Ibuprofen for pain control. ?--Follow up in 10 days for wound check. ?  ?  ?Melvyn Neth, MD ?

## 2021-09-15 NOTE — Patient Instructions (Addendum)
Please call the office with any questions or concerns. Please see your follow up appointment listed below. Please apply the ice pack throughout the day with slight pressure. You may try Tylenol for discomfort. ? ?Implanted Port Removal ? ?Implanted port removal is a procedure to remove the port and catheter that are implanted under your skin. The port is a small disc under your skin that can be punctured with a needle. It is connected to a vein in your chest or neck by a small, thin tube (catheter). The implanted port is used to give medicines for treatments, and it may also be used to take blood samples. ?Your health care provider will remove the implanted port if: ?You no longer need it for treatment. ?It is not working properly. ?The area around it gets infected. ?Tell a health care provider about: ?Any allergies you have. ?All medicines you are taking, including vitamins, herbs, eye drops, creams, and over-the-counter medicines. ?Any problems you or family members have had with anesthetic medicines. ?Any bleeding problems you have. ?Any surgeries you have had. ?Any medical conditions you have. ?Whether you are pregnant or may be pregnant. ?What are the risks? ?Generally, this is a safe procedure. However, problems may occur, including: ?Infection. ?Bleeding. ?Allergic reactions to anesthetic medicines. ?Damage to nerves or blood vessels. ?What happens before the procedure? ?Medicines ?Ask your health care provider about: ?Changing or stopping your regular medicines. This is especially important if you are taking diabetes medicines or blood thinners. ?Taking medicines such as aspirin and ibuprofen. These medicines can thin your blood. Do not take these medicines unless your health care provider tells you to take them. ?Taking over-the-counter medicines, vitamins, herbs, and supplements. ?Tests ?You will have: ?A physical exam. ?Blood tests. ?Imaging tests, including a chest X-ray. ?General instructions ?Follow  instructions from your health care provider about eating or drinking restrictions. ?Ask your health care provider: ?How your surgery site will be marked. ?What steps will be taken to help prevent infection. These steps may include: ?Removing hair at the surgery site. ?Washing skin with a germ-killing soap. ?Taking antibiotic medicine. ?If you will be going home right after the procedure, plan to have a responsible adult: ?Take you home from the hospital or clinic. You will not be allowed to drive. ?Care for you for the time you are told. ?What happens during the procedure? ?You may be given one or more of the following: ?A medicine to help you relax (sedative). ?A medicine to numb the area (local anesthetic). ?A small incision will be made at the site of your implanted port. ?The implanted port and the catheter that has been inside your vein will be gently removed. ?The port and catheter will be inspected to make sure that all the parts have been removed. Part of the catheter may be tested for bacteria. ?The incision will be closed with stitches (sutures), adhesive strips, or skin glue. ?A bandage (dressing) will be placed over the incision. The health care provider may apply gentle pressure over the dressing for about 5 minutes. ?The procedure may vary among health care providers and hospitals. ?What happens after the procedure? ?Your blood pressure, heart rate, breathing rate, and blood oxygen level will be monitored until you leave the hospital or clinic. ?You will be monitored to make sure that there is no bleeding from the site where the port was removed. ?If you were given a sedative during the procedure, it can affect you for several hours. Do not drive or operate machinery  until your health care provider says that it is safe. ?Summary ?Implanted port removal is a procedure to remove the port and catheter that are implanted under your skin. ?Before the procedure, follow your health care provider's  instructions about changing or stopping your regular medicines. This is especially important if you are taking diabetes medicines or blood thinners. ?If you will be going home right after the procedure, plan to have a responsible adult care for you for the time you are told. ?This information is not intended to replace advice given to you by your health care provider. Make sure you discuss any questions you have with your health care provider. ?Document Revised: 10/25/2020 Document Reviewed: 10/25/2020 ?Elsevier Patient Education ? [place]. ? ?

## 2021-09-15 NOTE — Progress Notes (Signed)
? ? ? ?Hematology/Oncology Consult note ?[facility]  ?[phone] [phone] ? ?Patient Care Team: ?Cherrie Distance, MD as PCP - General (Family Medicine) ?Clent Jacks, RN as Oncology Nurse Navigator  ? ?Name of the patient: Alexander Tran  ?[id number]  ?1950-05-25  ? ?Date of visit: 09/15/21 ? ?Diagnosis-   locally advanced GE junction adenocarcinoma likely stage III cT3 N1 M0 versus cT2 N1 M0 ?  ? ?Chief complaint/ Reason for visit-routine follow-up of esophageal cancer ? ?Heme/Onc history: Patient is a 71 year old male with a past medical history significant for hypertension type 2 diabetes and coronary artery disease who presented with symptoms of nausea vomiting to the ER as well as unintentional weight loss of about 40 pounds.  CT abdomen and pelvis without contrast in the ER showed mildly dilated distal esophagus with fluid content.  Underlying mass or stricture cannot be excluded.  There was no locoregional adenopathy or distant metastatic disease noted on CT abdomen.  Patient was seen by Dr. Verl Blalock and underwent EGD today.  Large fungating mass with no bleeding or stigmata of recent bleeding noted at the GE junction 40 cm from the incisors.  Mass was completely obstructing and circumferential.   ?  ?Biopsy showed moderately differentiated adenocarcinoma.Head CT scan showed distal esophageal activity with an SUV of 4.1 along the proximal stomach and GE junction.  0.6 cm distal paraesophageal lymph node with an SUV of 1.8.  Enlarged right gastric lymph node close proximity to the tumor 1.2 cm with an SUV of 2.5. ?  ?There was a large pneumoperitoneum in the upper abdomen which was also evaluated by general surgery Dr. Peyton Najjar who had put in the PEG tube and subsequent study did not show any evidence of contrast leakage into the peritoneal cavity. ?  ?Patient completed concurrent chemoradiation with weekly CarboTaxol in early January 2023.  However he could only  get 5 cycles ofCarboTaxol interval of 6 given thrombocytopenia.  He was evaluated by [facility] thoracic surgery and also has an appointment coming up with them in February 2023 to see what his scans show and if he would be a candidate for surgery ?  ?Patient was seen by cardiothoracic surgery Dr. Leslee Home IDU.  He underwent a [facility] CT scanAt [facility] which showed complete resolution of hypermetabolism in the area of esophagus as well as no evidence of distant metastatic disease.  This was followed by an endoscopy which did not show any residual mass in this GE junction.  Distal esophagus was normal and no evidence of cancer in the cardia.  Biopsies were negative for malignancy.  Plan is therefore to hold off on surgery at this time and continue surveillance EGD every 3 months with biopsies ?  ? ?Interval history-patient reports doing well.  His port was taken out today.  He is able to swallow normally without any difficulty.  He reports ongoing back pain for which she was seen by [facility] orthopedic surgery and was recommended MRI for possible interventional procedures. ? ?ECOG PS- 1 ?Pain scale- 0 ? ? ?Review of systems- Review of Systems  ?Constitutional:  Positive for malaise/fatigue. Negative for chills, fever and weight loss.  ?HENT:  Negative for congestion, ear discharge and nosebleeds.   ?Eyes:  Negative for blurred vision.  ?Respiratory:  Negative for cough, hemoptysis, sputum production, shortness of breath and wheezing.   ?Cardiovascular:  Negative for chest pain, palpitations, orthopnea and claudication.  ?Gastrointestinal:  Negative for abdominal pain, blood in stool, constipation, diarrhea,  heartburn, melena, nausea and vomiting.  ?Genitourinary:  Negative for dysuria, flank pain, frequency, hematuria and urgency.  ?Musculoskeletal:  Negative for back pain, joint pain and myalgias.  ?Skin:  Negative for rash.  ?Neurological:  Negative for dizziness, tingling, focal weakness, seizures, weakness and headaches.   ?Endo/Heme/Allergies:  Does not bruise/bleed easily.  ?Psychiatric/Behavioral:  Negative for depression and suicidal ideas. The patient does not have insomnia.    ? ? ?Allergies  ?Allergen Reactions  ? Iodine   ? Other Other (See Comments)  ?  Uncoded Allergy. Allergen: bandaid adhesive, Other Reaction: Contac Dermatitis  ? ? ? ?Past Medical History:  ?Diagnosis Date  ? Acid reflux   ? Diabetes mellitus without complication ([place])   ? Hyperlipidemia   ? Hypertension   ? Manic depression ([place])   ? MI (myocardial infarction) ([place])   ? 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 ? ? ?Past Surgical History:  ?Procedure Laterality Date  ? BACK SURGERY    ? CARDIAC CATHETERIZATION Left 11/02/2015  ? Procedure: Left Heart Cath and Coronary Angiography;  Surgeon: Isaias Cowman, MD;  Location: [facility];  Service: Cardiovascular;  Laterality: Left;  ? ESOPHAGOGASTRODUODENOSCOPY (EGD) WITH PROPOFOL N/A 03/03/2021  ? Procedure: ESOPHAGOGASTRODUODENOSCOPY (EGD) WITH PROPOFOL;  Surgeon: [name], MD;  Location: [facility] ENDOSCOPY;  Service: Endoscopy;  Laterality: N/A;  ? GASTROSTOMY N/A 03/04/2021  ? Procedure: INSERTION OF GASTROSTOMY TUBE;  Surgeon: Herbert Pun, MD;  Location: [facility] ORS;  Service: General;  Laterality: N/A;  ? IR CM INJ ANY COLONIC TUBE W/FLUORO  03/17/2021  ? PORTACATH PLACEMENT Right 03/06/2021  ? Procedure: INSERTION PORT-A-CATH;  Surgeon: Herbert Pun, MD;  Location: [facility] ORS;  Service: General;  Laterality: Right;  ? SURGERY SCROTAL / TESTICULAR    ? ? ?Social History  ? ?Socioeconomic History  ? Marital status: Single  ?  Spouse name: Not on file  ? Number of children: Not on file  ? Years of education: Not on file  ? Highest education level: Not on file  ?Occupational History  ? Occupation: retired Estate manager/land agent  ? Occupation: works part time now  ?Tobacco Use  ? Smoking status: Every Day  ?  Packs/day: 1.00  ?  Types: Cigarettes  ? Smokeless tobacco: Never  ?Vaping Use  ? Vaping  Use: Never used  ?Substance and Sexual Activity  ? Alcohol use: Not Currently  ? Drug use: No  ? Sexual activity: Not Currently  ?Other Topics Concern  ? Not on file  ?Social History Narrative  ? Not on file  ? ?Social Determinants of Health  ? ?Financial Resource Strain: Not on file  ?Food Insecurity: Not on file  ?Transportation Needs: Not on file  ?Physical Activity: Not on file  ?Stress: Not on file  ?Social Connections: Not on file  ?Intimate Partner Violence: Not on file  ? ? ?Family History  ?Problem Relation Age of Onset  ? Coronary artery disease Father   ? ? ? ?Current Outpatient Medications:  ?  aspirin 81 MG EC tablet, Take 1 tablet (81 mg total) by mouth daily., Disp: 30 tablet, Rfl: 3 ?  atorvastatin (LIPITOR) 40 MG tablet, Take 1 tablet (40 mg total) by mouth daily at 6 PM., Disp: 30 tablet, Rfl: 3 ?  levothyroxine (SYNTHROID) 100 MCG tablet, Take 100 mcg by mouth daily., Disp: , Rfl:  ?  lidocaine-prilocaine (EMLA) cream, Apply to affected area once, Disp: 30 g, Rfl: 3 ?  lisinopril (ZESTRIL) 2.5 MG tablet, Take 2.5  mg by mouth daily., Disp: , Rfl:  ?  LORazepam (ATIVAN) 0.5 MG tablet, Take 1 tablet (0.5 mg total) by mouth every 6 (six) hours as needed (Nausea or vomiting)., Disp: 30 tablet, Rfl: 0 ?  metFORMIN (GLUCOPHAGE) 1000 MG tablet, Take 1 tablet (1,000 mg total) by mouth 2 (two) times daily with a meal., Disp: 60 tablet, Rfl: 3 ?  metoprolol succinate (TOPROL-XL) 25 MG 24 hr tablet, Take 25 mg by mouth daily., Disp: , Rfl:  ?  metoprolol tartrate (LOPRESSOR) 25 MG tablet, Take 1 tablet (25 mg total) by mouth 2 (two) times daily., Disp: 60 tablet, Rfl: 3 ?  Nutritional Supplements (FEEDING SUPPLEMENT, GLUCERNA 1.5 CAL,) LIQD, Give 2 cartons at 8am, 2 cartons at noon, 2 cartons at 6pm and 1 carton at 10pm (total 7 cartons/day).  Flush with 235m at first 3 feedings and 2419mat last feeding of the day., Disp: 1659 mL, Rfl: 2 ?  ondansetron (ZOFRAN) 8 MG tablet, Take 1 tablet (8 mg total) by  mouth 2 (two) times daily as needed for refractory nausea / vomiting. Start on day 3 after chemo., Disp: 30 tablet, Rfl: 1 ?  prochlorperazine (COMPAZINE) 10 MG tablet, Take 1 tablet (10 mg total) by mouth every 6

## 2021-09-25 ENCOUNTER — Ambulatory Visit: Payer: Medicare Other

## 2021-09-26 DIAGNOSIS — M5136 Other intervertebral disc degeneration, lumbar region: Secondary | ICD-10-CM | POA: Diagnosis not present

## 2021-09-26 DIAGNOSIS — M9973 Connective tissue and disc stenosis of intervertebral foramina of lumbar region: Secondary | ICD-10-CM | POA: Diagnosis not present

## 2021-09-29 DIAGNOSIS — M4316 Spondylolisthesis, lumbar region: Secondary | ICD-10-CM | POA: Diagnosis not present

## 2021-09-29 DIAGNOSIS — M9973 Connective tissue and disc stenosis of intervertebral foramina of lumbar region: Secondary | ICD-10-CM | POA: Diagnosis not present

## 2021-09-29 DIAGNOSIS — M47816 Spondylosis without myelopathy or radiculopathy, lumbar region: Secondary | ICD-10-CM | POA: Diagnosis not present

## 2021-09-29 DIAGNOSIS — M5416 Radiculopathy, lumbar region: Secondary | ICD-10-CM | POA: Diagnosis not present

## 2021-09-29 DIAGNOSIS — M5136 Other intervertebral disc degeneration, lumbar region: Secondary | ICD-10-CM | POA: Diagnosis not present

## 2021-10-03 DIAGNOSIS — I2510 Atherosclerotic heart disease of native coronary artery without angina pectoris: Secondary | ICD-10-CM | POA: Diagnosis not present

## 2021-10-03 DIAGNOSIS — Z1389 Encounter for screening for other disorder: Secondary | ICD-10-CM | POA: Diagnosis not present

## 2021-10-03 DIAGNOSIS — M1711 Unilateral primary osteoarthritis, right knee: Secondary | ICD-10-CM | POA: Diagnosis not present

## 2021-10-03 DIAGNOSIS — M5440 Lumbago with sciatica, unspecified side: Secondary | ICD-10-CM | POA: Diagnosis not present

## 2021-10-03 DIAGNOSIS — C159 Malignant neoplasm of esophagus, unspecified: Secondary | ICD-10-CM | POA: Diagnosis not present

## 2021-10-03 DIAGNOSIS — I10 Essential (primary) hypertension: Secondary | ICD-10-CM | POA: Diagnosis not present

## 2021-10-03 DIAGNOSIS — G8929 Other chronic pain: Secondary | ICD-10-CM | POA: Diagnosis not present

## 2021-10-03 DIAGNOSIS — Z0000 Encounter for general adult medical examination without abnormal findings: Secondary | ICD-10-CM | POA: Diagnosis not present

## 2021-10-16 DIAGNOSIS — H35031 Hypertensive retinopathy, right eye: Secondary | ICD-10-CM | POA: Diagnosis not present

## 2021-10-16 DIAGNOSIS — H25813 Combined forms of age-related cataract, bilateral: Secondary | ICD-10-CM | POA: Diagnosis not present

## 2021-10-25 DIAGNOSIS — M5136 Other intervertebral disc degeneration, lumbar region: Secondary | ICD-10-CM | POA: Diagnosis not present

## 2021-10-25 DIAGNOSIS — M48062 Spinal stenosis, lumbar region with neurogenic claudication: Secondary | ICD-10-CM | POA: Diagnosis not present

## 2021-11-02 DIAGNOSIS — M5136 Other intervertebral disc degeneration, lumbar region: Secondary | ICD-10-CM | POA: Diagnosis not present

## 2021-11-02 DIAGNOSIS — M48062 Spinal stenosis, lumbar region with neurogenic claudication: Secondary | ICD-10-CM | POA: Diagnosis not present

## 2021-11-09 ENCOUNTER — Encounter: Payer: Self-pay | Admitting: Radiation Oncology

## 2021-11-09 ENCOUNTER — Ambulatory Visit
Admission: RE | Admit: 2021-11-09 | Discharge: 2021-11-09 | Disposition: A | Discharge status: Home / Self Care | Payer: Medicare Other | Source: Ambulatory Visit | Attending: Radiation Oncology | Admitting: Radiation Oncology

## 2021-11-09 VITALS — BP 137/83 | HR 87 | Temp 97.6°F | Resp 16 | Wt 216.0 lb

## 2021-11-09 DIAGNOSIS — C155 Malignant neoplasm of lower third of esophagus: Secondary | ICD-10-CM | POA: Diagnosis not present

## 2021-11-09 DIAGNOSIS — Z923 Personal history of irradiation: Secondary | ICD-10-CM | POA: Diagnosis not present

## 2021-11-09 DIAGNOSIS — Z8501 Personal history of malignant neoplasm of esophagus: Secondary | ICD-10-CM | POA: Insufficient documentation

## 2021-11-09 DIAGNOSIS — Z08 Encounter for follow-up examination after completed treatment for malignant neoplasm: Secondary | ICD-10-CM | POA: Diagnosis not present

## 2021-11-09 NOTE — Progress Notes (Signed)
Radiation Oncology Follow up Note  Name: Alexander Tran   Date:   11/09/2021 MRN:  [id number] DOB: May 29, 1950    This 71 y.o. 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  He is seen today in routine follow-up and is doing well he has put on weight and is swallowing without problem.  [facility] hypermetabolic activity in the esophagus as well as no evidence of distant metastatic disease.  He also an upper endoscopy with biopsy showing no evidence of to support recurrent or persistent disease.  [place] is being [place] closely [place]  COMPLICATIONS OF TREATMENT: none  FOLLOW UP COMPLIANCE: keeps appointments   PHYSICAL EXAM:  BP 137/83 (BP Location: Right Arm, Patient Position: Sitting, Cuff Size: Normal)   Pulse 87   Temp 97.6 F (36.4 C) (Tympanic)   Resp 16   Wt 216 lb (98 kg)   BMI 25.61 kg/m  Well-developed well-nourished patient in NAD. HEENT reveals PERLA, EOMI, discs not visualized.  Oral cavity is clear. No oral mucosal lesions are identified. Neck is clear without evidence of cervical or supraclavicular adenopathy. Lungs are clear to A&P. Cardiac examination is essentially unremarkable with regular rate and rhythm without murmur rub or thrill. Abdomen is benign with no organomegaly or masses noted. Motor sensory and DTR levels are equal and symmetric in the upper and lower extremities. Cranial nerves II through XII are grossly intact. Proprioception is intact. No peripheral adenopathy or edema is identified. No motor or sensory levels are  noted. Crude visual fields are within normal range.  RADIOLOGY RESULTS: PET CT scan reviewed compatible with above-stated findings  PLAN: At the present time patient is doing well with no evidence of disease.  Continues to be followed clinically [place] with medical [place]ology.  At this time all her his records have been transferred.  Be happy to reevaluate him or add my opinion at any time should that be indicated.  Patient is to call with any concerns.  I would like to take this opportunity to thank you for allowing me to participate in the care of your patient..Noreene Filbert MD

## 2021-11-15 DIAGNOSIS — C159 Malignant neoplasm of esophagus, unspecified: Secondary | ICD-10-CM | POA: Diagnosis not present

## 2021-11-20 DIAGNOSIS — C159 Malignant neoplasm of esophagus, unspecified: Secondary | ICD-10-CM | POA: Diagnosis not present

## 2021-11-27 ENCOUNTER — Other Ambulatory Visit: Payer: Self-pay

## 2021-12-04 DIAGNOSIS — C159 Malignant neoplasm of esophagus, unspecified: Secondary | ICD-10-CM | POA: Diagnosis not present

## 2021-12-04 DIAGNOSIS — E119 Type 2 diabetes mellitus without complications: Secondary | ICD-10-CM | POA: Diagnosis not present

## 2021-12-04 DIAGNOSIS — E039 Hypothyroidism, unspecified: Secondary | ICD-10-CM | POA: Diagnosis not present

## 2021-12-04 DIAGNOSIS — Z1211 Encounter for screening for malignant neoplasm of colon: Secondary | ICD-10-CM | POA: Diagnosis not present

## 2021-12-04 DIAGNOSIS — Z23 Encounter for immunization: Secondary | ICD-10-CM | POA: Diagnosis not present

## 2021-12-04 DIAGNOSIS — Z720 Tobacco use: Secondary | ICD-10-CM | POA: Diagnosis not present

## 2021-12-04 DIAGNOSIS — D126 Benign neoplasm of colon, unspecified: Secondary | ICD-10-CM | POA: Diagnosis not present

## 2021-12-04 DIAGNOSIS — Z0000 Encounter for general adult medical examination without abnormal findings: Secondary | ICD-10-CM | POA: Diagnosis not present

## 2021-12-06 DIAGNOSIS — C159 Malignant neoplasm of esophagus, unspecified: Secondary | ICD-10-CM | POA: Diagnosis not present

## 2021-12-06 DIAGNOSIS — Z0189 Encounter for other specified special examinations: Secondary | ICD-10-CM | POA: Diagnosis not present

## 2021-12-06 DIAGNOSIS — C160 Malignant neoplasm of cardia: Secondary | ICD-10-CM | POA: Diagnosis not present

## 2021-12-15 DIAGNOSIS — Z800 Family history of malignant neoplasm of digestive organs: Secondary | ICD-10-CM | POA: Diagnosis not present

## 2021-12-15 DIAGNOSIS — K2289 Other specified disease of esophagus: Secondary | ICD-10-CM | POA: Diagnosis not present

## 2021-12-15 DIAGNOSIS — K2950 Unspecified chronic gastritis without bleeding: Secondary | ICD-10-CM | POA: Diagnosis not present

## 2021-12-15 DIAGNOSIS — K209 Esophagitis, unspecified without bleeding: Secondary | ICD-10-CM | POA: Diagnosis not present

## 2021-12-15 DIAGNOSIS — Z08 Encounter for follow-up examination after completed treatment for malignant neoplasm: Secondary | ICD-10-CM | POA: Diagnosis not present

## 2021-12-15 DIAGNOSIS — Z8501 Personal history of malignant neoplasm of esophagus: Secondary | ICD-10-CM | POA: Diagnosis not present

## 2021-12-15 DIAGNOSIS — K2100 Gastro-esophageal reflux disease with esophagitis, without bleeding: Secondary | ICD-10-CM | POA: Diagnosis not present

## 2021-12-15 DIAGNOSIS — K3189 Other diseases of stomach and duodenum: Secondary | ICD-10-CM | POA: Diagnosis not present

## 2021-12-27 ENCOUNTER — Other Ambulatory Visit: Payer: Self-pay

## 2022-01-19 DIAGNOSIS — J209 Acute bronchitis, unspecified: Secondary | ICD-10-CM | POA: Diagnosis not present

## 2022-01-30 DIAGNOSIS — H04123 Dry eye syndrome of bilateral lacrimal glands: Secondary | ICD-10-CM | POA: Diagnosis not present

## 2022-01-30 DIAGNOSIS — H25813 Combined forms of age-related cataract, bilateral: Secondary | ICD-10-CM | POA: Diagnosis not present

## 2022-02-20 DIAGNOSIS — Z720 Tobacco use: Secondary | ICD-10-CM | POA: Diagnosis not present

## 2022-02-20 DIAGNOSIS — C159 Malignant neoplasm of esophagus, unspecified: Secondary | ICD-10-CM | POA: Diagnosis not present

## 2022-02-20 DIAGNOSIS — Z01818 Encounter for other preprocedural examination: Secondary | ICD-10-CM | POA: Diagnosis not present

## 2022-02-20 DIAGNOSIS — Z23 Encounter for immunization: Secondary | ICD-10-CM | POA: Diagnosis not present

## 2022-02-20 DIAGNOSIS — K219 Gastro-esophageal reflux disease without esophagitis: Secondary | ICD-10-CM | POA: Diagnosis not present

## 2022-02-20 DIAGNOSIS — E119 Type 2 diabetes mellitus without complications: Secondary | ICD-10-CM | POA: Diagnosis not present

## 2022-02-20 DIAGNOSIS — E039 Hypothyroidism, unspecified: Secondary | ICD-10-CM | POA: Diagnosis not present

## 2022-02-28 DIAGNOSIS — Z9889 Other specified postprocedural states: Secondary | ICD-10-CM | POA: Diagnosis not present

## 2022-02-28 DIAGNOSIS — Z09 Encounter for follow-up examination after completed treatment for conditions other than malignant neoplasm: Secondary | ICD-10-CM | POA: Diagnosis not present

## 2022-02-28 DIAGNOSIS — H25812 Combined forms of age-related cataract, left eye: Secondary | ICD-10-CM | POA: Diagnosis not present

## 2022-03-28 DIAGNOSIS — Z79899 Other long term (current) drug therapy: Secondary | ICD-10-CM | POA: Diagnosis not present

## 2022-03-28 DIAGNOSIS — Z7982 Long term (current) use of aspirin: Secondary | ICD-10-CM | POA: Diagnosis not present

## 2022-03-28 DIAGNOSIS — K2289 Other specified disease of esophagus: Secondary | ICD-10-CM | POA: Diagnosis not present

## 2022-03-28 DIAGNOSIS — I2510 Atherosclerotic heart disease of native coronary artery without angina pectoris: Secondary | ICD-10-CM | POA: Diagnosis not present

## 2022-03-28 DIAGNOSIS — E119 Type 2 diabetes mellitus without complications: Secondary | ICD-10-CM | POA: Diagnosis not present

## 2022-03-28 DIAGNOSIS — F17210 Nicotine dependence, cigarettes, uncomplicated: Secondary | ICD-10-CM | POA: Diagnosis not present

## 2022-03-28 DIAGNOSIS — C159 Malignant neoplasm of esophagus, unspecified: Secondary | ICD-10-CM | POA: Diagnosis not present

## 2022-03-28 DIAGNOSIS — Z923 Personal history of irradiation: Secondary | ICD-10-CM | POA: Diagnosis not present

## 2022-03-28 DIAGNOSIS — K208 Other esophagitis without bleeding: Secondary | ICD-10-CM | POA: Diagnosis not present

## 2022-03-28 DIAGNOSIS — Z5111 Encounter for antineoplastic chemotherapy: Secondary | ICD-10-CM | POA: Diagnosis not present

## 2022-03-28 DIAGNOSIS — K3189 Other diseases of stomach and duodenum: Secondary | ICD-10-CM | POA: Diagnosis not present

## 2022-03-28 DIAGNOSIS — Z9221 Personal history of antineoplastic chemotherapy: Secondary | ICD-10-CM | POA: Diagnosis not present

## 2022-04-03 DIAGNOSIS — Z9889 Other specified postprocedural states: Secondary | ICD-10-CM | POA: Diagnosis not present

## 2022-04-03 DIAGNOSIS — Z09 Encounter for follow-up examination after completed treatment for conditions other than malignant neoplasm: Secondary | ICD-10-CM | POA: Diagnosis not present

## 2022-04-04 DIAGNOSIS — Z0189 Encounter for other specified special examinations: Secondary | ICD-10-CM | POA: Diagnosis not present

## 2022-04-04 DIAGNOSIS — C159 Malignant neoplasm of esophagus, unspecified: Secondary | ICD-10-CM | POA: Diagnosis not present

## 2022-04-04 DIAGNOSIS — R911 Solitary pulmonary nodule: Secondary | ICD-10-CM | POA: Diagnosis not present

## 2022-04-04 DIAGNOSIS — C160 Malignant neoplasm of cardia: Secondary | ICD-10-CM | POA: Diagnosis not present

## 2022-04-18 DIAGNOSIS — R198 Other specified symptoms and signs involving the digestive system and abdomen: Secondary | ICD-10-CM | POA: Diagnosis not present

## 2022-04-18 DIAGNOSIS — C159 Malignant neoplasm of esophagus, unspecified: Secondary | ICD-10-CM | POA: Diagnosis not present

## 2022-07-11 ENCOUNTER — Other Ambulatory Visit: Payer: Self-pay

## 2023-03-22 IMAGING — CT CT CHEST-ABD-PELV W/ CM
2 of 5 series · 12 of 36 positions shown, 14 images · IV contrast (agent unspecified)
Comparison: Multiple priors including most recent Benrabah Etoil

CLINICAL DATA: History GE junction adenocarcinoma with radiation
and chemotherapy complete. Prior history of testicular cancer status
post orchiectomy

* Tracking Code: BO *
EXAM:
CT CHEST, ABDOMEN, AND PELVIS WITH CONTRAST
TECHNIQUE: Multidetector CT imaging of the chest, abdomen and pelvis was
performed following the standard protocol during bolus
administration of intravenous contrast.

[Series 2: axials cap 5.00 · axial · 0.91mm/px · z∈[-1576,-931]mm · 9 of 159 slices shown, 11 images]
[im 15/159  mediastinal]
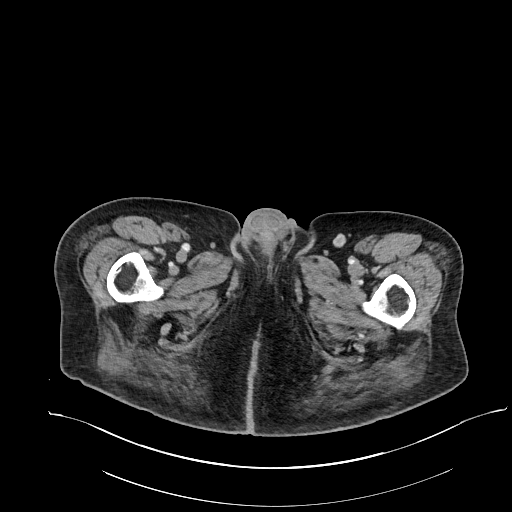
[im 15/159  bone]
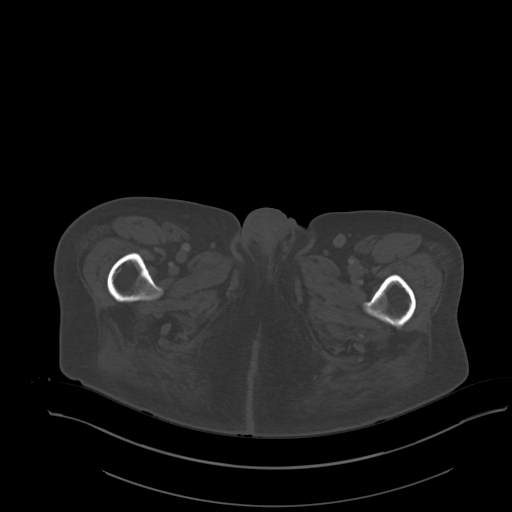
[im 29/159  mediastinal]
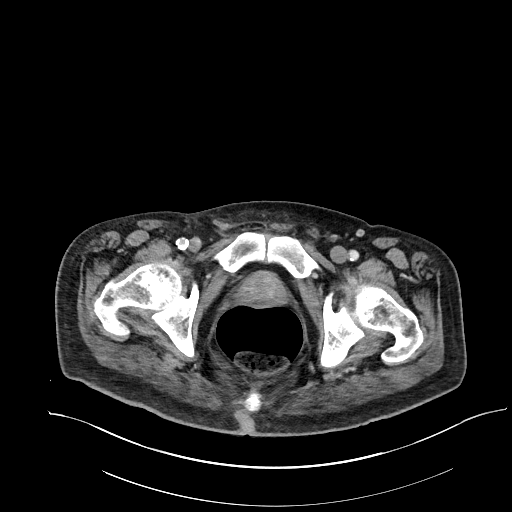
[im 44/159  mediastinal]
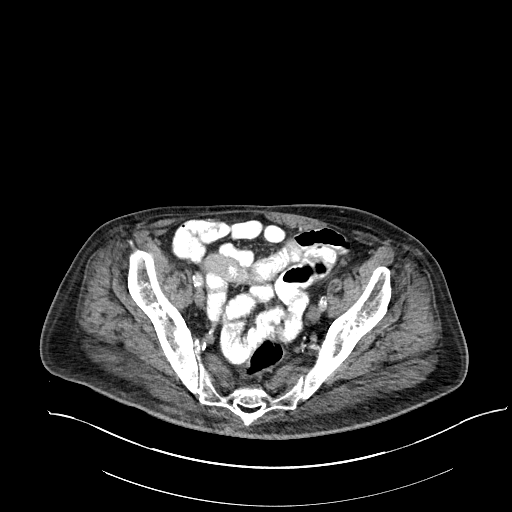
[im 58/159  mediastinal]
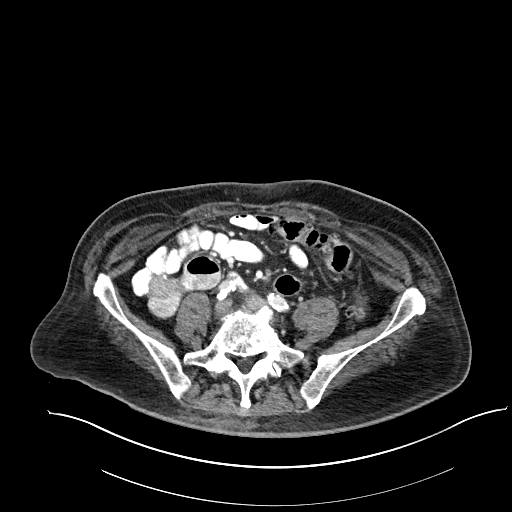
[im 87/159  mediastinal]
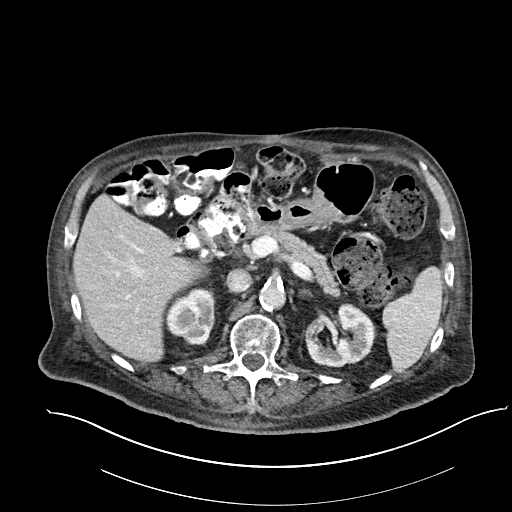
[im 101/159  mediastinal]
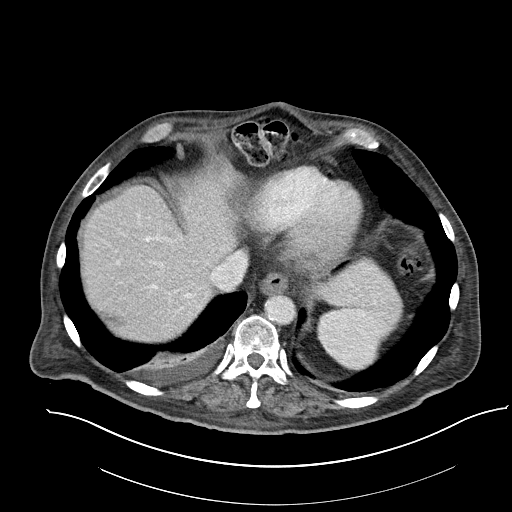
[im 115/159  mediastinal]
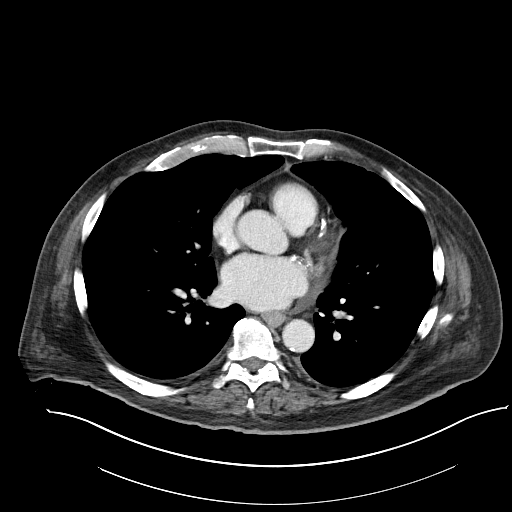
[im 130/159  mediastinal]
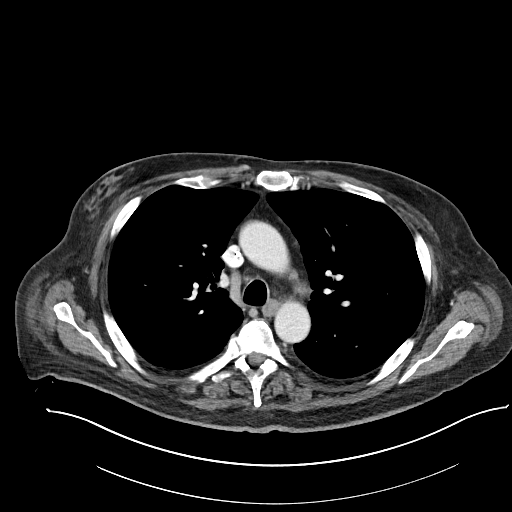
[im 144/159  mediastinal]
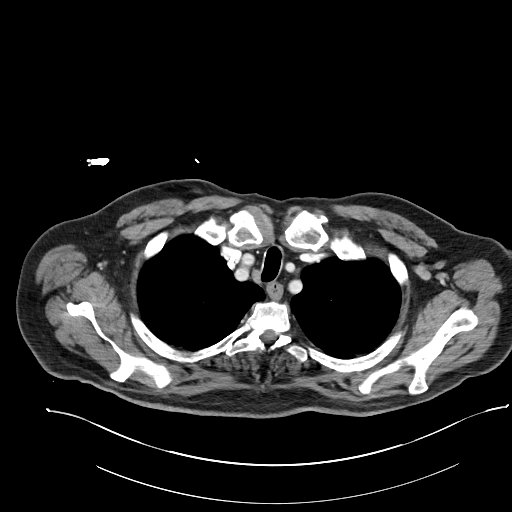
[im 144/159  bone]
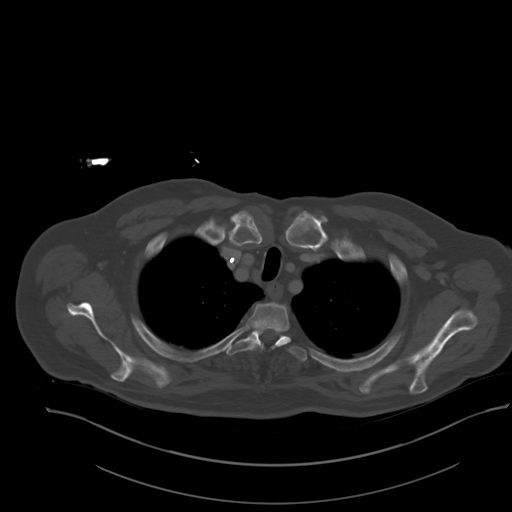

[Series 4: coronals cap 2.00 cor · coronal · 0.89mm/px · 3 of 158 slices shown]
[im 32/158  mediastinal]
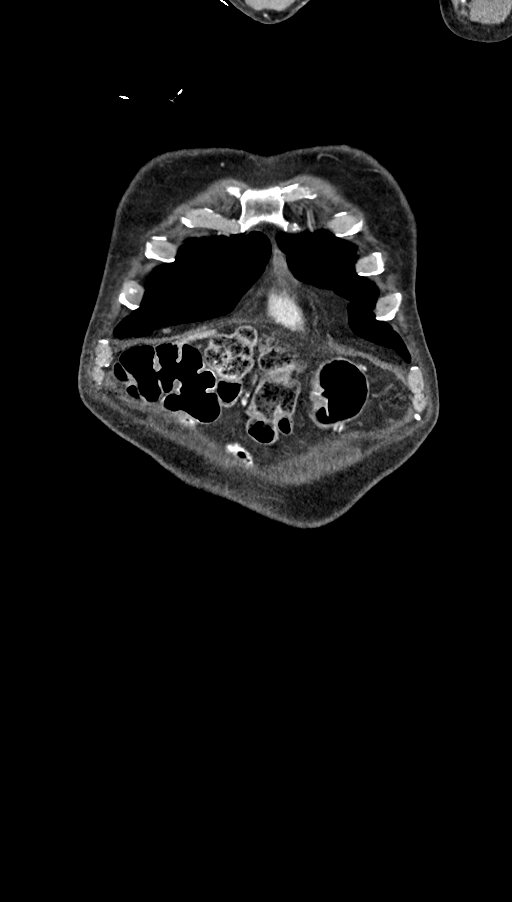
[im 63/158  mediastinal]
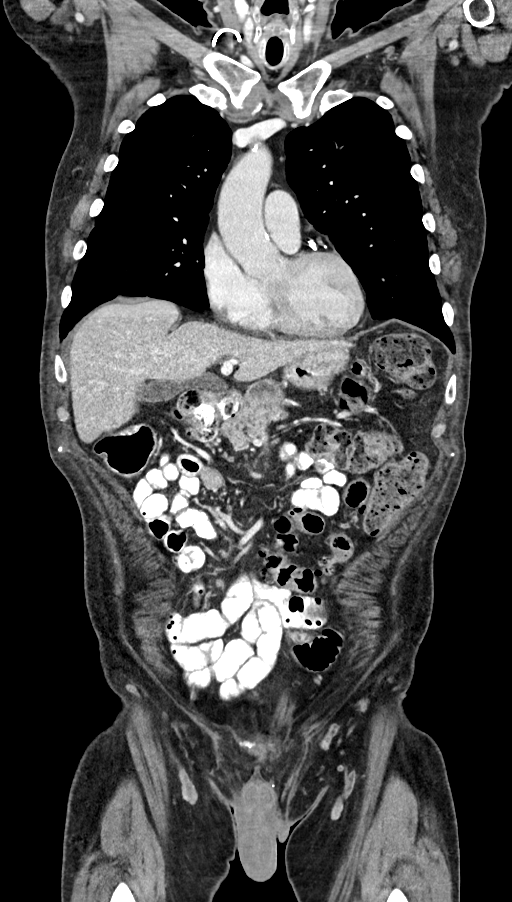
[im 95/158  mediastinal]
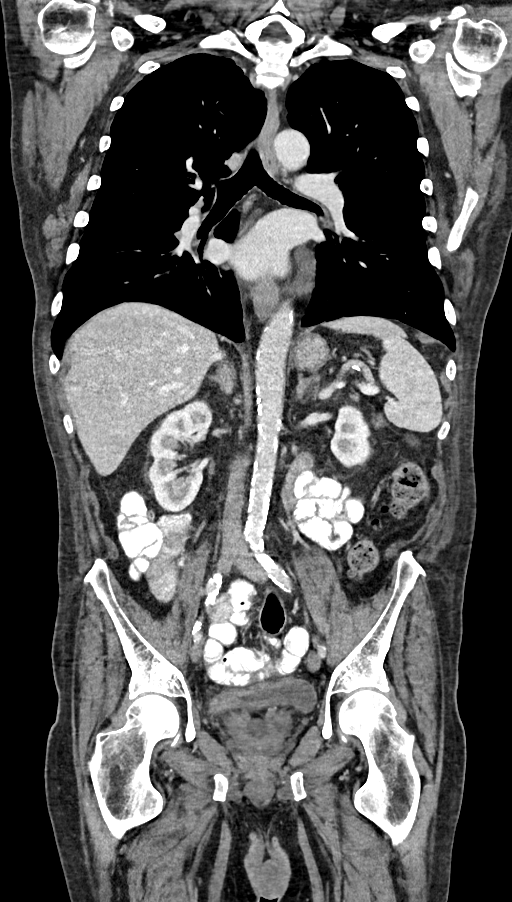

[12 of 36 positions shown; findings below may reference images not displayed]

RADIATION DOSE REDUCTION: This exam was performed according to the
departmental dose-optimization program which includes automated
exposure control, adjustment of the mA and/or kV according to
patient size and/or use of iterative reconstruction technique.

CONTRAST:  100mL OMNIPAQUE IOHEXOL 300 MG/ML  SOLN
FINDINGS: CT CHEST FINDINGS

Cardiovascular: Aortic and branch vessel atherosclerosis without
thoracic aortic aneurysm. Accessed right chest Port-A-Cath with tip
at the superior cavoatrial junction. Coronary artery calcifications.
Normal size heart. Small pericardial effusion is similar prior.

Mediastinum/Nodes: No supraclavicular adenopathy. No discrete
thyroid nodule. Prominent mediastinal lymph nodes not pathologically
enlarged by size criteria. Previously index distal paraesophageal
lymph node measures 3 mm on image 49/2 previously 6 mm and was
minimally hypermetabolic on PET-CT. No pathologically enlarged
mediastinal, hilar or axillary lymph nodes.

Mild symmetric wall thickening of the esophagus with some soft
tissue thickening at the GE junction for instance on image 64/2.

Lungs/Pleura: Small right and trace left pleural effusions with
adjacent atelectasis. Centrilobular emphysema. Stable small 4 mm
right lower lobe pulmonary nodule on image 101/3, which was not
hypermetabolic on PET-CT of the low the resolution of that
examination. No new suspicious pulmonary nodules or masses.

Musculoskeletal: No aggressive lytic or blastic lesions of bone.
Multilevel degenerative changes spine. Gynecomastia.

CT ABDOMEN PELVIS FINDINGS

Hepatobiliary: No suspicious hepatic lesion. Gallbladder is
unremarkable. No biliary ductal dilation.

Pancreas: No pancreatic ductal dilation or evidence of acute
inflammation.

Spleen: No splenomegaly or focal splenic lesion.

Adrenals/Urinary Tract: Bilateral adrenal glands appear normal. No
hydronephrosis. Kidneys demonstrate symmetric enhancement and
excretion of contrast material. Mild wall thickening of an
incompletely distended urinary bladder.

Stomach/Bowel: Radiopaque enteric contrast material traverses the
hepatic flexure. Stomach is minimally distended limiting evaluation.
Interval removal of the percutaneous gastrostomy tube. No pathologic
dilation of large or small bowel. No evidence of acute bowel
inflammation.

Vascular/Lymphatic: Aortic and branch vessel atherosclerosis without
abdominal aortic aneurysm. No pathologically enlarged abdominal or
pelvic lymph nodes. The previously indexed gastrohepatic lymph node
now measures 4 mm in short axis on image 67/2 previously this was
mildly hypermetabolic and measured 1.2 cm on PET-CT.

Reproductive: Prostate is unremarkable.

Other: Trace retroperitoneal and mesenteric stranding. No discrete
peritoneal or omental nodularity.

Musculoskeletal: No aggressive lytic or blastic lesion of bone.
Multilevel degenerative changes spine. Degenerative changes
bilateral hips and SI joints.
IMPRESSION: 1. Mild symmetric wall thickening of the esophagus with some soft
tissue thickening at the GE junction likely corresponding with the
patient's treated tumor, no focal masslike lesion identified.
2. The previously indexed distal paraesophageal and gastrohepatic
lymph nodes have decreased in size compared to PET-CT. No
pathologically enlarged lymph nodes are identified within the chest,
abdomen, or pelvis.
3. No convincing evidence of distant metastatic disease within the
chest abdomen or pelvis.
4. Small right and trace left pleural effusions with adjacent
atelectasis.
5. Mild wall thickening of an incompletely distended urinary
bladder. Correlate with urinalysis to exclude cystitis.
6. Aortic Atherosclerosis (35989-RHM.M) and Emphysema (35989-R83.I).
